# Patient Record
Sex: Female | Born: 1950 | Hispanic: Yes | Marital: Married | Smoking: Never smoker
Health system: Southern US, Community
[De-identification: ages and names within clinical notes are randomized; demographics above are authoritative.]

## PROBLEM LIST (undated history)

## (undated) DIAGNOSIS — M6282 Rhabdomyolysis: Secondary | ICD-10-CM

## (undated) DIAGNOSIS — H269 Unspecified cataract: Secondary | ICD-10-CM

## (undated) DIAGNOSIS — K219 Gastro-esophageal reflux disease without esophagitis: Secondary | ICD-10-CM

## (undated) DIAGNOSIS — R7401 Elevation of levels of liver transaminase levels: Secondary | ICD-10-CM

## (undated) DIAGNOSIS — D62 Acute posthemorrhagic anemia: Secondary | ICD-10-CM

## (undated) DIAGNOSIS — K8020 Calculus of gallbladder without cholecystitis without obstruction: Secondary | ICD-10-CM

## (undated) DIAGNOSIS — B369 Superficial mycosis, unspecified: Secondary | ICD-10-CM

## (undated) DIAGNOSIS — E785 Hyperlipidemia, unspecified: Secondary | ICD-10-CM

## (undated) DIAGNOSIS — R112 Nausea with vomiting, unspecified: Secondary | ICD-10-CM

## (undated) DIAGNOSIS — I8390 Asymptomatic varicose veins of unspecified lower extremity: Secondary | ICD-10-CM

## (undated) HISTORY — PX: GASTROSTOMY W/ FEEDING TUBE: SUR642

## (undated) HISTORY — DX: Unspecified cataract: H26.9

## (undated) HISTORY — DX: Acute posthemorrhagic anemia: D62

## (undated) HISTORY — DX: Elevation of levels of liver transaminase levels: R74.01

## (undated) HISTORY — DX: Rhabdomyolysis: M62.82

## (undated) HISTORY — PX: HERNIA REPAIR: SHX51

## (undated) HISTORY — DX: Asymptomatic varicose veins of unspecified lower extremity: I83.90

---

## 2008-10-11 ENCOUNTER — Ambulatory Visit (HOSPITAL_COMMUNITY): Admission: RE | Admit: 2008-10-11 | Discharge: 2008-10-11 | Payer: Self-pay | Admitting: Family Medicine

## 2008-10-21 ENCOUNTER — Ambulatory Visit (HOSPITAL_COMMUNITY): Admission: RE | Admit: 2008-10-21 | Discharge: 2008-10-21 | Payer: Self-pay | Admitting: Family Medicine

## 2009-01-15 HISTORY — PX: VARICOSE VEIN SURGERY: SHX832

## 2010-07-26 ENCOUNTER — Other Ambulatory Visit (HOSPITAL_COMMUNITY): Payer: Self-pay | Admitting: General Surgery

## 2010-07-26 DIAGNOSIS — R1011 Right upper quadrant pain: Secondary | ICD-10-CM

## 2010-07-26 DIAGNOSIS — R110 Nausea: Secondary | ICD-10-CM

## 2010-07-27 ENCOUNTER — Other Ambulatory Visit (HOSPITAL_COMMUNITY): Payer: Self-pay

## 2010-07-27 ENCOUNTER — Ambulatory Visit (HOSPITAL_COMMUNITY)
Admission: RE | Admit: 2010-07-27 | Discharge: 2010-07-27 | Disposition: A | Discharge status: Home / Self Care | Payer: Self-pay | Source: Ambulatory Visit | Attending: General Surgery | Admitting: General Surgery

## 2010-07-27 DIAGNOSIS — K8020 Calculus of gallbladder without cholecystitis without obstruction: Secondary | ICD-10-CM | POA: Insufficient documentation

## 2010-07-27 DIAGNOSIS — R1011 Right upper quadrant pain: Secondary | ICD-10-CM | POA: Insufficient documentation

## 2010-07-27 DIAGNOSIS — K7689 Other specified diseases of liver: Secondary | ICD-10-CM | POA: Insufficient documentation

## 2010-07-27 DIAGNOSIS — R110 Nausea: Secondary | ICD-10-CM

## 2010-08-01 ENCOUNTER — Encounter (HOSPITAL_COMMUNITY): Payer: Self-pay

## 2010-08-01 ENCOUNTER — Other Ambulatory Visit: Payer: Self-pay

## 2010-08-01 ENCOUNTER — Ambulatory Visit (HOSPITAL_COMMUNITY)
Admission: RE | Admit: 2010-08-01 | Discharge: 2010-08-01 | Disposition: A | Discharge status: Home / Self Care | Payer: Self-pay | Source: Ambulatory Visit | Attending: General Surgery | Admitting: General Surgery

## 2010-08-01 DIAGNOSIS — Z01810 Encounter for preprocedural cardiovascular examination: Secondary | ICD-10-CM | POA: Insufficient documentation

## 2010-08-01 DIAGNOSIS — Z01812 Encounter for preprocedural laboratory examination: Secondary | ICD-10-CM | POA: Insufficient documentation

## 2010-08-01 DIAGNOSIS — K8020 Calculus of gallbladder without cholecystitis without obstruction: Secondary | ICD-10-CM | POA: Insufficient documentation

## 2010-08-01 HISTORY — DX: Nausea with vomiting, unspecified: R11.2

## 2010-08-01 LAB — BASIC METABOLIC PANEL
CO2: 28 mEq/L (ref 19–32)
Chloride: 102 mEq/L (ref 96–112)
Creatinine, Ser: 0.5 mg/dL (ref 0.50–1.10)
GFR calc Af Amer: >60 mL/min (ref >60)
Glucose, Bld: 95 mg/dL (ref 70–99)
Potassium: 4.5 mEq/L (ref 3.5–5.1)

## 2010-08-01 LAB — CBC
MCHC: 33.9 g/dL (ref 30.0–36.0)
MCV: 95.8 fL (ref 78.0–100.0)
Platelets: 227 10*3/uL (ref 150–400)
RBC: 4.06 MIL/uL (ref 3.87–5.11)
RDW: 12.7 % (ref 11.5–15.5)

## 2010-08-01 LAB — HEPATIC FUNCTION PANEL
AST: 18 U/L (ref 0–37)
Albumin: 4.4 g/dL (ref 3.5–5.2)
Alkaline Phosphatase: 78 U/L (ref 39–117)
Indirect Bilirubin: 0.6 mg/dL (ref 0.3–0.9)
Total Bilirubin: 0.7 mg/dL (ref 0.3–1.2)

## 2010-08-01 LAB — DIFFERENTIAL
Lymphs Abs: 2.5 10*3/uL (ref 0.7–4.0)
Neutrophils Relative %: 43 % (ref 43–77)

## 2010-08-01 LAB — AMYLASE: Amylase: 34 U/L (ref 0–105)

## 2010-08-01 NOTE — Patient Instructions (Signed)
20 KOMAL STANGELO  08/01/2010   Your procedure is scheduled on:  Tuesday, 08/08/10  Report to [facility] at 06:15 AM.  Call this number if you have problems the morning of surgery: [phone]   Remember:   Do not eat food:After Midnight.  Do not drink clear liquids: After Midnight.  Take these medicines the morning of surgery with A SIP OF WATER: none   Do not wear jewelry, make-up or nail polish.  Do not bring valuables to the hospital.  Contacts, dentures or bridgework may not be worn into surgery.  Leave suitcase in the car. After surgery it may be brought to your room.  For patients admitted to the hospital, checkout time is 11:00 AM the day of discharge.   Patients discharged the day of surgery will not be allowed to drive home.  Name and phone number of your driver: France Ravens  Special Instructions: CHG Shower Shower 2 days before surgery and 1 day before surgery with Hibiclens.   Please read over the following fact sheets that you were given: Pain Booklet, MRSA Information, Surgical Site Infection Prevention and Anesthesia Post-op Instructions   Instrucciones a seguir luego de la anestesia general en los adultos (Instructions Following General Anesthetic, Adult) Usted fue sometido a anestesia general. Un anestesista (un enfermero especializado en administrar anestesia) o un anestesilogo (un mdico especializado en administrar anestesia) lo ha inducido a dormir con Designer, multimedia un procedimiento. Durante las 24 horas siguientes al procedimiento podr sentir:  Research scientist (life sciences).   Debilidad.   Somnolencia.  DESPUS DE LA CIRUGA Despus de la [place], lo llevarn a una sala de recuperacin. Un enfermero controlar su evolucin. Una vez que despierte, se encuentre estabilizado y pueda ingerir lquidos, usted podr volver a su hogar excepto que ocurra un imprevisto. La informacin que sigue es vlida para las primeras 24 horas posteriores a Higher education careers adviser.  No  conduzca. Si est solo, no tome transportes pblicos.   No beba alcohol.   No tome medicamentos que no le haya autorizado el profesional que lo asiste.   No firme documentos importantes ni tome decisiones trascendentes.   Es importante que una persona responsable lo acompae durante las primeras 24 horas luego de la anestesia.   Puede reanudar su dieta y sus actividades normales segn se le haya indicado.   Utilice los medicamentos de venta libre o de prescripcin para Chief Technology Officer, Environmental health practitioner o la [place], segn se lo indique el profesional que lo asiste.  Si tiene preguntas o se le presenta algn problema relacionado con la anestesia, comunquese con el hospital y pida por el anestesista o anestesilogo de [place]. SOLICITE ATENCIN MDICA DE INMEDIATO SI:  Aparece una erupcin cutnea.   Presenta dificultad para respirar.   Siente dolor en el pecho.   Presenta algn problema de alergia.  Document Released: 01/01/2005 Document Re-Released: 06/21/2009 [facility] Patient Information 2011 [place], [place].

## 2010-08-08 ENCOUNTER — Encounter (HOSPITAL_COMMUNITY): Payer: Self-pay | Admitting: Anesthesiology

## 2010-08-08 ENCOUNTER — Ambulatory Visit (HOSPITAL_COMMUNITY): Payer: Self-pay | Admitting: Anesthesiology

## 2010-08-08 ENCOUNTER — Encounter (HOSPITAL_COMMUNITY): Payer: Self-pay | Admitting: General Surgery

## 2010-08-08 ENCOUNTER — Other Ambulatory Visit: Payer: Self-pay | Admitting: General Surgery

## 2010-08-08 ENCOUNTER — Encounter (HOSPITAL_COMMUNITY)
Admission: RE | Disposition: A | Discharge status: Home / Self Care | Payer: Self-pay | Source: Ambulatory Visit | Attending: General Surgery

## 2010-08-08 ENCOUNTER — Inpatient Hospital Stay (HOSPITAL_COMMUNITY)
Admission: RE | Admit: 2010-08-08 | Discharge: 2010-08-09 | DRG: 419 | Disposition: A | Discharge status: Home / Self Care | Payer: Self-pay | Source: Ambulatory Visit | Attending: General Surgery | Admitting: General Surgery

## 2010-08-08 DIAGNOSIS — Z01812 Encounter for preprocedural laboratory examination: Secondary | ICD-10-CM

## 2010-08-08 DIAGNOSIS — K8010 Calculus of gallbladder with chronic cholecystitis without obstruction: Principal | ICD-10-CM | POA: Diagnosis present

## 2010-08-08 DIAGNOSIS — K8020 Calculus of gallbladder without cholecystitis without obstruction: Secondary | ICD-10-CM

## 2010-08-08 DIAGNOSIS — Z01810 Encounter for preprocedural cardiovascular examination: Secondary | ICD-10-CM

## 2010-08-08 HISTORY — PX: CHOLECYSTECTOMY: SHX55

## 2010-08-08 HISTORY — DX: Calculus of gallbladder without cholecystitis without obstruction: K80.20

## 2010-08-08 SURGERY — LAPAROSCOPIC CHOLECYSTECTOMY
Anesthesia: General | Wound class: Contaminated

## 2010-08-08 MED ORDER — ROCURONIUM BROMIDE 100 MG/10ML IV SOLN
INTRAVENOUS | Status: DC | PRN
  Administered 2010-08-08: 5 mg via INTRAVENOUS
  Administered 2010-08-08: 25 mg via INTRAVENOUS
  Administered 2010-08-08: 10 mg via INTRAVENOUS

## 2010-08-08 MED ORDER — LORAZEPAM 1 MG PO TABS: 1.0000 mg | ORAL_TABLET | Freq: Every evening | ORAL | Status: DC | PRN

## 2010-08-08 MED ORDER — LACTATED RINGERS IV SOLN
INTRAVENOUS | Status: DC
  Administered 2010-08-08 (×2): via INTRAVENOUS

## 2010-08-08 MED ORDER — POTASSIUM CHLORIDE IN NACL 20-0.9 MEQ/L-% IV SOLN
INTRAVENOUS | Status: DC
  Administered 2010-08-08 – 2010-08-09 (×2): via INTRAVENOUS
  Administered 2010-08-09: 1000 mL via INTRAVENOUS
  Filled 2010-08-08: qty 1000

## 2010-08-08 MED ORDER — FENTANYL CITRATE 0.05 MG/ML IJ SOLN
INTRAMUSCULAR | Status: AC
  Filled 2010-08-08: qty 5

## 2010-08-08 MED ORDER — ONDANSETRON HCL 4 MG/2ML IJ SOLN
4.0000 mg | Freq: Once | INTRAMUSCULAR | Status: AC
  Administered 2010-08-08: 4 mg via INTRAVENOUS

## 2010-08-08 MED ORDER — MORPHINE SULFATE 2 MG/ML IJ SOLN
1.0000 mg | INTRAMUSCULAR | Status: DC | PRN
  Administered 2010-08-08 (×3): 1 mg via INTRAVENOUS
  Filled 2010-08-08 (×3): qty 1

## 2010-08-08 MED ORDER — ONDANSETRON HCL 4 MG/2ML IJ SOLN: 4.0000 mg | Freq: Four times a day (QID) | INTRAMUSCULAR | Status: DC | PRN

## 2010-08-08 MED ORDER — ROCURONIUM BROMIDE 50 MG/5ML IV SOLN
INTRAVENOUS | Status: AC
  Filled 2010-08-08: qty 1

## 2010-08-08 MED ORDER — MIDAZOLAM HCL 2 MG/2ML IJ SOLN
1.0000 mg | INTRAMUSCULAR | Status: DC | PRN
  Administered 2010-08-08 (×2): 2 mg via INTRAVENOUS

## 2010-08-08 MED ORDER — ONDANSETRON HCL 4 MG/2ML IJ SOLN
INTRAMUSCULAR | Status: AC
  Administered 2010-08-08: 4 mg via INTRAVENOUS
  Filled 2010-08-08: qty 2

## 2010-08-08 MED ORDER — ONDANSETRON HCL 4 MG/2ML IJ SOLN: 4.0000 mg | Freq: Once | INTRAMUSCULAR | Status: DC | PRN

## 2010-08-08 MED ORDER — GLYCOPYRROLATE 0.2 MG/ML IJ SOLN
INTRAMUSCULAR | Status: DC | PRN
  Administered 2010-08-08 (×2): 0.2 mg via INTRAVENOUS

## 2010-08-08 MED ORDER — SODIUM CHLORIDE 0.9 % IV SOLN: INTRAVENOUS | Status: DC

## 2010-08-08 MED ORDER — MIDAZOLAM HCL 2 MG/2ML IJ SOLN
INTRAMUSCULAR | Status: AC
  Administered 2010-08-08: 2 mg via INTRAVENOUS
  Filled 2010-08-08: qty 2

## 2010-08-08 MED ORDER — LIDOCAINE HCL (PF) 1 % IJ SOLN
INTRAMUSCULAR | Status: AC
  Filled 2010-08-08: qty 5

## 2010-08-08 MED ORDER — NEOSTIGMINE METHYLSULFATE 1 MG/ML IJ SOLN
INTRAMUSCULAR | Status: DC | PRN
  Administered 2010-08-08 (×2): 1.24 mg via INTRAMUSCULAR

## 2010-08-08 MED ORDER — BUPIVACAINE HCL (PF) 0.5 % IJ SOLN
INTRAMUSCULAR | Status: AC
  Filled 2010-08-08: qty 30

## 2010-08-08 MED ORDER — ACETAMINOPHEN 325 MG PO TABS
650.0000 mg | ORAL_TABLET | ORAL | Status: DC | PRN
  Administered 2010-08-08 – 2010-08-09 (×2): 650 mg via ORAL
  Filled 2010-08-08 (×2): qty 2

## 2010-08-08 MED ORDER — PHENOL 1.4 % MT LIQD
1.0000 | OROMUCOSAL | Status: DC | PRN
  Administered 2010-08-08: 1 via OROMUCOSAL
  Filled 2010-08-08: qty 177

## 2010-08-08 MED ORDER — DEXTROSE 5 % IV SOLN
INTRAVENOUS | Status: AC
  Filled 2010-08-08: qty 1

## 2010-08-08 MED ORDER — GLYCOPYRROLATE 0.2 MG/ML IJ SOLN
INTRAMUSCULAR | Status: AC
  Filled 2010-08-08: qty 1

## 2010-08-08 MED ORDER — PROPOFOL 10 MG/ML IV EMUL
INTRAVENOUS | Status: AC
  Filled 2010-08-08: qty 20

## 2010-08-08 MED ORDER — LIDOCAINE HCL (PF) 1 % IJ SOLN
INTRAMUSCULAR | Status: AC
  Filled 2010-08-08: qty 2

## 2010-08-08 MED ORDER — BUPIVACAINE HCL (PF) 0.25 % IJ SOLN
INTRAMUSCULAR | Status: DC | PRN
  Administered 2010-08-08: 11 mL

## 2010-08-08 MED ORDER — FENTANYL CITRATE 0.05 MG/ML IJ SOLN
INTRAMUSCULAR | Status: DC | PRN
  Administered 2010-08-08 (×2): 50 ug via INTRAVENOUS
  Administered 2010-08-08: 100 ug via INTRAVENOUS
  Administered 2010-08-08 (×6): 50 ug via INTRAVENOUS

## 2010-08-08 MED ORDER — FENTANYL CITRATE 0.05 MG/ML IJ SOLN: 25.0000 ug | INTRAMUSCULAR | Status: DC | PRN

## 2010-08-08 MED ORDER — PROPOFOL 10 MG/ML IV EMUL
INTRAVENOUS | Status: DC | PRN
  Administered 2010-08-08: 100 mg via INTRAVENOUS

## 2010-08-08 MED ORDER — DEXTROSE 5 % IV SOLN: 1.0000 g | Freq: Once | INTRAVENOUS | Status: DC

## 2010-08-08 MED ORDER — NEOSTIGMINE METHYLSULFATE 1 MG/ML IJ SOLN
INTRAMUSCULAR | Status: AC
  Filled 2010-08-08: qty 10

## 2010-08-08 SURGICAL SUPPLY — 67 items
APPLICATOR COTTON TIP 6IN STRL (MISCELLANEOUS) ×2 IMPLANT
APPLIER CLIP ROT 10 11.4 M/L (STAPLE) ×2
ATTRACTOMAT 16X20 MAGNETIC DRP (DRAPES) IMPLANT
BAG HAMPER (MISCELLANEOUS) ×2 IMPLANT
BLADE SURG 15 STRL LF DISP TIS (BLADE) ×1 IMPLANT
BLADE SURG 15 STRL SS (BLADE) ×1
BLADE SURG SZ10 CARB STEEL (BLADE) IMPLANT
CLIP APPLIE ROT 10 11.4 M/L (STAPLE) ×1 IMPLANT
CLOTH BEACON ORANGE TIMEOUT ST (SAFETY) ×2 IMPLANT
COVER LIGHT HANDLE STERIS (MISCELLANEOUS) ×4 IMPLANT
DECANTER SPIKE VIAL GLASS SM (MISCELLANEOUS) ×2 IMPLANT
DISSECTOR BLUNT TIP ENDO 5MM (MISCELLANEOUS) ×4 IMPLANT
DRAPE WARM FLUID 44X44 (DRAPE) IMPLANT
DRSG TEGADERM 2-3/8X2-3/4 SM (GAUZE/BANDAGES/DRESSINGS) ×6 IMPLANT
ELECT BLADE 6 FLAT ULTRCLN (ELECTRODE) IMPLANT
ELECT REM PT RETURN 9FT ADLT (ELECTROSURGICAL) ×2
ELECTRODE REM PT RTRN 9FT ADLT (ELECTROSURGICAL) ×1 IMPLANT
EVACUATOR DRAINAGE 10X20 100CC (DRAIN) ×1 IMPLANT
EVACUATOR SILICONE 100CC (DRAIN) ×1
FILTER SMOKE EVAC LAPAROSHD (FILTER) ×2 IMPLANT
FORMALIN 10 PREFIL 120ML (MISCELLANEOUS) ×2 IMPLANT
GLOVE BIO SURGEON STRL SZ8 (GLOVE) ×2 IMPLANT
GLOVE BIOGEL PI IND STRL 7.0 (GLOVE) ×2 IMPLANT
GLOVE BIOGEL PI INDICATOR 7.0 (GLOVE) ×2
GLOVE ECLIPSE 6.5 STRL STRAW (GLOVE) ×4 IMPLANT
GLOVE EXAM NITRILE XS STR PU (GLOVE) ×2 IMPLANT
GLOVE INDICATOR 8.0 STRL GRN (GLOVE) ×2 IMPLANT
GLOVE SKINSENSE NS SZ7.0 (GLOVE) ×1
GLOVE SKINSENSE STRL SZ7.0 (GLOVE) ×1 IMPLANT
GOWN BRE IMP SLV AUR XL STRL (GOWN DISPOSABLE) ×8 IMPLANT
HEMOSTAT SURGICEL 4X8 (HEMOSTASIS) ×2 IMPLANT
INST SET LAPROSCOPIC AP (KITS) ×2 IMPLANT
IV NS IRRIG 3000ML ARTHROMATIC (IV SOLUTION) ×2 IMPLANT
KIT ROOM TURNOVER APOR (KITS) ×2 IMPLANT
KIT TROCAR LAP CHOLE (TROCAR) ×2 IMPLANT
MANIFOLD NEPTUNE II (INSTRUMENTS) ×2 IMPLANT
NS IRRIG 1000ML POUR BTL (IV SOLUTION) ×2 IMPLANT
PACK LAP CHOLE LZT030E (CUSTOM PROCEDURE TRAY) ×2 IMPLANT
PAD ARMBOARD 7.5X6 YLW CONV (MISCELLANEOUS) ×2 IMPLANT
PENCIL HANDSWITCHING (ELECTRODE) IMPLANT
POUCH SPECIMEN RETRIEVAL 10MM (ENDOMECHANICALS) ×2 IMPLANT
RETRACTOR LAPSCP 12X46 CVD (ENDOMECHANICALS) ×1 IMPLANT
RTRCTR LAPSCP 12X46 CVD (ENDOMECHANICALS) ×2
SET BASIN LINEN APH (SET/KITS/TRAYS/PACK) ×2 IMPLANT
SET TUBE IRRIG SUCTION NO TIP (IRRIGATION / IRRIGATOR) ×2 IMPLANT
SOL PREP PROV IODINE SCRUB 4OZ (MISCELLANEOUS) ×2 IMPLANT
SPONGE DRAIN TRACH 4X4 STRL 2S (GAUZE/BANDAGES/DRESSINGS) ×2 IMPLANT
SPONGE GAUZE 4X4 12PLY (GAUZE/BANDAGES/DRESSINGS) ×2 IMPLANT
SPONGE INTESTINAL PEANUT (DISPOSABLE) ×4 IMPLANT
SPONGE LAP 18X18 X RAY DECT (DISPOSABLE) IMPLANT
STAPLER VISISTAT 35W (STAPLE) ×2 IMPLANT
SUT ETHILON 3 0 FSL (SUTURE) ×2 IMPLANT
SUT SILK 2 0 (SUTURE)
SUT SILK 2 0 SH (SUTURE) IMPLANT
SUT SILK 2-0 18XBRD TIE 12 (SUTURE) IMPLANT
SUT SILK 3 0 SH CR/8 (SUTURE) IMPLANT
SUT VIC AB 0 CT1 27 (SUTURE)
SUT VIC AB 0 CT1 27XBRD ANTBC (SUTURE) IMPLANT
SUT VIC AB 0 CT1 27XCR 8 STRN (SUTURE) IMPLANT
SUT VICRYL 0 UR6 27IN ABS (SUTURE) ×2 IMPLANT
SYR BULB IRRIGATION 50ML (SYRINGE) IMPLANT
TOWEL OR 17X26 4PK STRL BLUE (TOWEL DISPOSABLE) ×2 IMPLANT
TRAY FOLEY CATH 14FR (SET/KITS/TRAYS/PACK) ×2 IMPLANT
TROCAR Z-THREAD SLEEVE 11X100 (TROCAR) ×2 IMPLANT
WARMER LAPAROSCOPE (MISCELLANEOUS) ×2 IMPLANT
WATER STERILE IRR 1000ML POUR (IV SOLUTION) ×4 IMPLANT
YANKAUER SUCT BULB TIP 10FT TU (MISCELLANEOUS) IMPLANT

## 2010-08-08 NOTE — Anesthesia Preprocedure Evaluation (Addendum)
Anesthesia Evaluation  Name, MR# and DOB Patient awake  General Assessment Comment  Reviewed: Allergy & Precautions, H&P  and Patient's Chart, lab work & pertinent test results  Airway Mallampati: II  Neck ROM: Full    Dental  (+) Edentulous Upper and Edentulous Lower   Pulmonaryneg pulmonary ROS    clear to auscultation    Cardiovascular Regular Normal   Neuro/Psych  GI/Hepatic/Renal   Endo/Other   Abdominal   Musculoskeletal  Hematology   Peds  Reproductive/Obstetrics   Anesthesia Other Findings             Anesthesia Physical Anesthesia Plan  ASA: I  Anesthesia Plan: General   Post-op Pain Management:    Induction:   Airway Management Planned: Oral ETT  Additional Equipment:   Intra-op Plan:   Post-operative Plan:   Informed Consent: I have reviewed the patients History and Physical, chart, labs and discussed the procedure including the risks, benefits and alternatives for the proposed anesthesia with the patient or authorized representative who has indicated his/her understanding and acceptance.     Plan Discussed with:   Anesthesia Plan Comments:         Anesthesia Quick Evaluation

## 2010-08-08 NOTE — Op Note (Signed)
Lisa Mcdowell, Lisa Mcdowell NO.:  [id number]  MEDICAL RECORD NO.:  [id number]  LOCATION:  A306                          FACILITY:  [facility]  PHYSICIAN:  Barbaraann Barthel, M.D. DATE OF BIRTH:  [birth year]  DATE OF PROCEDURE:  08/08/2010 DATE OF DISCHARGE:                              OPERATIVE REPORT   SURGEON:  Barbaraann Barthel, MD.  PREOPERATIVE DIAGNOSIS:  Cholecystitis, cholelithiasis.  POSTOPERATIVE DIAGNOSIS:  Cholecystitis, cholelithiasis.  PROCEDURE:  Laparoscopic cholecystectomy.  SPECIMEN:  Gallbladder and stones.  WOUND CLASSIFICATION:  Contaminated (small amount of bile leakage).  Note, this is a 60 year old [place] American female, who had recurrent episodes of right upper quadrant pain and nausea.  We discussed the need for surgery for her when a sonogram revealed that she had multiple gallstones.  We discussed complications not limited to but including bleeding, infection, damage to bile ducts, perforation of organs, transitory diarrhea, and the possibility that open surgery might be required.  This was all explained to her in [place].  An informed consent was obtained.  TECHNIQUE:  The patient was placed in supine position.  After the adequate administration of general anesthesia via endotracheal intubation, her entire abdomen was prepped with Betadine solution and draped in usual manner.  Foley catheter was aseptically inserted, and after prepping with Betadine and draping in the usual manner, a periumbilical incision was carried out over the superior aspect of the umbilicus.  This incision was carried down to the fascia.  The fascia was clearly visualized, grasped with a sharp towel clip and elevated, and a Veress needle was inserted and confirmed the position with a saline drop test.  We then insufflated the abdomen with approximately 3.5 liters of CO2, and then placed the cameras using the Visiport technique through an 11-mm cannula in the  umbilicus.  We then under direct vision placed 11-mm cannula in the epigastrium and two 5-mm cannulas in the right upper quadrant laterally.  Gallbladder was grasped.  Its adhesions were taken down.  She had a very floppy left lobe of the liver which we were able to bipennate the patient manipulation, able to get out of the view of the surgery, and then with careful dissection, we dissected the cystic duct, which was a short cystic duct, we put two silver clips on this, and silver clipped close to the gallbladder and then divided this as well as put two clips on the cystic artery.  The gallbladder was then removed uneventfully from the liver bed using the hook cautery device.  We checked for hemostasis. There was very minimal oozing.  We irrigated with normal saline solution i.e. had a small amount of oozing from the gallbladder which I nicked and removing it.  I elected to place Surgicel on a Jackson-Pratt drain in the liver bed after irrigation.  The Jackson-Pratt drain exited through one of the lateral most sites.  We then desufflated the abdomen after checking for hemostasis and then sutured the drain in place with 3- 0 nylon and closed the fascia in the area of the umbilicus and the epigastrium with 0 Polysorb suture.  I then used 0.5% Sensorcaine for all port sites for postoperative comfort, then closed the  wound with stapling device.  Prior to closure, all sponge, needle, and instrument counts found to be correct.  Estimated blood loss was minimal.  The patient received approximately 1400 mL of crystalloids intraoperatively. There were no complications.     Barbaraann Barthel, M.D.     WB/MEDQ  D:  08/08/2010  T:  08/08/2010  Job:  [id number]

## 2010-08-08 NOTE — Transfer of Care (Signed)
Immediate Anesthesia Transfer of Care Note  Patient: Lisa Mcdowell  Procedure(s) Performed:  LAPAROSCOPIC CHOLECYSTECTOMY  Patient Location: PACU  Anesthesia Type: General  Level of Consciousness: awake, alert  and oriented  Airway & Oxygen Therapy: Patient Spontanous Breathing and Patient connected to face mask oxygen  Post-op Assessment: Report given to PACU RN, Post -op Vital signs reviewed and stable and Patient moving all extremities  Post vital signs: Reviewed and stable  Complications: No apparent anesthesia complications

## 2010-08-08 NOTE — Brief Op Note (Signed)
08/08/2010  9:56 AM  PATIENT:  Lisa Mcdowell  60 y.o. female  PRE-OPERATIVE DIAGNOSIS:  Cholelithiasis/cholecystitis  POST-OPERATIVE DIAGNOSIS:Cholelithiasis/cholecystitis   PROCEDURE:  Procedure(s):LAPAROSCOPIC CHOLECYSTECTOMY    SURGEON:  Surgeon(s): Marlane Hatcher  PHYSICIAN ASSISTANT:   ASSISTANTS:    ANESTHESIA:   general  ESTIMATED BLOOD LOSS:minimal   BLOOD ADMINISTERED:none  DRAINS: (in liver bed) Jackson-Pratt drain(s) with closed bulb suction in the    LOCAL MEDICATIONS USED:  XYLOCAINE 10CC  SPECIMEN:  Source of Specimen:  gall bladder and stones  DISPOSITION OF SPECIMEN:  PATHOLOGY  COUNTS:  YES  TOURNIQUET:  * No tourniquets in log *  DICTATION #:    PLAN OF CARE: observation  PATIENT DISPOSITION:  PACU - hemodynamically stable.   Delay start of Pharmacological VTE agent (>24hrs) due to surgical blood loss or risk of bleeding:  yes

## 2010-08-08 NOTE — Anesthesia Postprocedure Evaluation (Signed)
  Anesthesia Post-op Note  Patient: Lisa Mcdowell  Procedure(s) Performed:  LAPAROSCOPIC CHOLECYSTECTOMY  Patient Location: PACU  Anesthesia Type: General  Level of Consciousness: awake  Airway and Oxygen Therapy: Patient Spontanous Breathing  Post-op Pain: mild  Post-op Assessment: Patient's Cardiovascular Status Stable, Respiratory Function Stable, Patent Airway and No signs of Nausea or vomiting  Post-op Vital Signs: stable  Complications: No apparent anesthesia complications

## 2010-08-08 NOTE — Progress Notes (Signed)
  Pt awake and alert. vital signs stable has voided. Abdomen soft minimal sero sanguinous  jp drainage. Wounds clean and dry. Doing well post op.

## 2010-08-08 NOTE — Anesthesia Procedure Notes (Signed)
Procedure Name: Intubation Date/Time: 08/08/2010 8:18 AM Performed by: Marylene Buerger Pre-anesthesia Checklist: Suction available, Emergency Drugs available, Patient identified and Patient being monitored Patient Re-evaluated:Patient Re-evaluated prior to inductionOxygen Delivery Method: Circle System Utilized Preoxygenation: Pre-oxygenation with 100% oxygen Intubation Type: IV induction Ventilation: Mask ventilation without difficulty Laryngoscope Size: Mac and 3 Grade View: Grade I Tube type: Oral Tube size: 7.0 mm Number of attempts: 1 Placement Confirmation: breath sounds checked- equal and bilateral,  positive ETCO2 and ETT inserted through vocal cords under direct vision Secured at: 21 cm Tube secured with: Tape Dental Injury: Teeth and Oropharynx as per pre-operative assessment

## 2010-08-08 NOTE — H&P (Signed)
NAMELATORIE, MONTESANO NO.:  [id number]  MEDICAL RECORD NO.:  [id number]  LOCATION:                                 FACILITY:  PHYSICIAN:  Barbaraann Barthel, M.D. DATE OF BIRTH:  06/23/1952  DATE OF ADMISSION: DATE OF DISCHARGE:  LH                             HISTORY & PHYSICAL   HISTORY OF PRESENT ILLNESS:  This is a 60 year old Latin American female with approximately 2 years history of recurrent abdominal pain with nausea that is basically postprandial in nature and located primarily to the right upper quadrant and at times radiating to her back.  She has had no vomiting, however, she has had recurrent nausea from this.  She was worked up as an outpatient and she was found to have on sonogram a gallbladder with multiple stones.  Rest of her laboratory work is pending at this time.  PAST MEDICAL HISTORY:  Unremarkable.  PAST SURGICAL HISTORY:  She underwent a left leg varicose laser surgery last year in 2011.  ALLERGIES:  She has no known allergies.  MEDICATIONS:  Takes no medications on a regular basis.  SOCIAL HISTORY:  She is a nonsmoker and a nondrinker.  REVIEW OF SYSTEMS:  NEUROLOGIC:  No history of migraines or seizures. ENDOCRINE SYSTEM:  No diabetes or thyroid disease.  CARDIORESPIRATORY SYSTEM:  Negative.  MUSCULOSKELETAL SYSTEM:  Within normal limits.  The patient has had no colonoscopy.  RECTAL:  Deferred at this time. OB/GYN:  She is a gravida 3, para 3, cesarean zero, abortus zero female with no family history of breast carcinoma.  Her last mammogram was in 2011.  GI SYSTEM:  No history of hepatitis, constipation, diarrhea, or bright red rectal bleeding, black tarry stools or history of inflammatory bowel disease or unexplained weight loss.  GU SYSTEM:  No dysuria or history of kidney stones.  PHYSICAL EXAMINATION:  VITAL SIGNS:  She is 5 feet 7 inches, weighs 170 pounds, her temperature is 98.3, her pulse rate is 68, her  respirations are 12, and her blood pressure is 130/70. HEAD:  Normocephalic. EYES:  Extraocular movements are intact.  Pupils are round and react to light and accommodation.  There is no conjunctival pallor or scleral injection.  There is no icterus.  The patient does have pterygiums noted bilaterally. NOSE AND ORAL MUCOSA:  Within normal limits.  The patient has dental prosthesis. NECK:  Supple and cylindrical without jugular vein distention, thyromegaly, tracheal deviation or cervical adenopathy.  No bruits are appreciated. CHEST:  Clear, both anterior and posterior auscultation. HEART:  Regular rhythm. BREASTS AND AXILLA:  Without masses. ABDOMEN:  The patient has some mild right upper quadrant pain but without rebound. PELVIC:  Deferred. EXTREMITIES:  As stated, she is status post left varicose vein surgery and wears elastic hose over this. SKIN:  Integuments otherwise are normal.    We discussed the need for surgery with this patient in Spanish in great detail, explaining complications not limited to but including bleeding, infection, damage to bile ducts, perforation of organs, transitory diarrhea, and the possibility that open surgery might be required. Informed consent was obtained.  We will follow up with her for out patient surgery.  Barbaraann Barthel, M.D.     WB/MEDQ  D:  08/07/2010  T:  08/08/2010  Job:  [id number]  cc:   Outpatient Surgery Department  EPIC

## 2010-08-09 LAB — CBC
MCH: 33.1 pg (ref 26.0–34.0)
MCHC: 34.4 g/dL (ref 30.0–36.0)
Platelets: 179 10*3/uL (ref 150–400)
RDW: 12.9 % (ref 11.5–15.5)

## 2010-08-09 LAB — HEPATIC FUNCTION PANEL
Albumin: 3.1 g/dL — ABNORMAL LOW (ref 3.5–5.2)
Indirect Bilirubin: 0.6 mg/dL (ref 0.3–0.9)
Total Protein: 6.8 g/dL (ref 6.0–8.3)

## 2010-08-09 LAB — BASIC METABOLIC PANEL
BUN: 9 mg/dL (ref 6–23)
Calcium: 8.4 mg/dL (ref 8.4–10.5)
Creatinine, Ser: 0.47 mg/dL — ABNORMAL LOW (ref 0.50–1.10)
GFR calc Af Amer: >60 mL/min (ref >60)
GFR calc non Af Amer: >60 mL/min (ref >60)
Potassium: 3.6 mEq/L (ref 3.5–5.1)

## 2010-08-09 LAB — GLUCOSE, CAPILLARY: Glucose-Capillary: 107 mg/dL — ABNORMAL HIGH (ref 70–99)

## 2010-08-09 NOTE — Discharge Summary (Signed)
  Brief observation discharge note: Patient underwent lap cholecystectomy for cholecystitis and cholelithiasis via outpatient department. Uneventful post op course. Discharge on first postoperative day. Wound clean; labs acceptable; patient with minimal discomfort. Discharge and follow up arranged in spanish.

## 2010-08-09 NOTE — Progress Notes (Signed)
Pt discharged home today with family per Dr. Malvin Johns. Pt's VS stable at this time. Pt's IV site D/C'd and WNL. Pt and daughter provided with discharge instructions including home medication list and follow up appointment instructions in spanish. Pt and daughter verbalized understanding. Pt left floor via WC accompanied by Evette Cristal, NT. Dagoberto Ligas, RN

## 2010-08-09 NOTE — Progress Notes (Signed)
  POD#1  Afebrile, vital signs stable, voiding well.  Wound clean, minimal JP drainage.  Labs OK.  LFS OK; bili normal.dressing changed.  D/C and F/U arranged. Vital signs: 98.8 F (37.1 C) Pulse: 73 --RR: 18 B/P: 120/70 mmHg  94 % None (Room air)        Labs ; Hepatic function panel  Resulted: 08/09/10 0710 Specimen Type: Blood Total Protein 6.8 g/dL Albumin 3.1 g/dL L AST 39 U/L H ALT 30 U/L Alkaline Phosphatase 57 U/L Total Bilirubin 0.7 mg/dL Bilirubin, Direct 0.1 mg/dL Indirect Bilirubin 0.6 mg/dL   CBC: WBC 8.9 K/uL RBC 3.50 MIL/uL L Hemoglobin 11.6 g/dL L HCT 78.2 % L MCV 95.6 fL MCH 33.1 pg MCHC 34.4 g/dL RDW 21.3 % Platelets 086 K  Sodium 138 mEq/L Potassium 3.6 mEq/L Chloride 106 mEq/L CO2 21 mEq/L Glucose, Bld 107 mg/dL H BUN 9 mg/dL Creatinine, Ser 5.78 mg/dL L Calcium 8.4 mg/dL GFR calc non Af Amer >46 mL/min GFR calc Af Amer >60 mL/min

## 2010-08-16 ENCOUNTER — Encounter (HOSPITAL_COMMUNITY): Payer: Self-pay | Admitting: General Surgery

## 2013-01-26 ENCOUNTER — Emergency Department (HOSPITAL_COMMUNITY): Payer: Self-pay

## 2013-01-26 ENCOUNTER — Emergency Department (HOSPITAL_COMMUNITY)
Admission: EM | Admit: 2013-01-26 | Discharge: 2013-01-26 | Disposition: A | Discharge status: Home / Self Care | Payer: Self-pay | Attending: Emergency Medicine | Admitting: Emergency Medicine

## 2013-01-26 ENCOUNTER — Encounter (HOSPITAL_COMMUNITY): Payer: Self-pay | Admitting: Emergency Medicine

## 2013-01-26 DIAGNOSIS — Z8719 Personal history of other diseases of the digestive system: Secondary | ICD-10-CM | POA: Insufficient documentation

## 2013-01-26 DIAGNOSIS — R079 Chest pain, unspecified: Secondary | ICD-10-CM

## 2013-01-26 DIAGNOSIS — R0789 Other chest pain: Secondary | ICD-10-CM | POA: Insufficient documentation

## 2013-01-26 LAB — CBC WITH DIFFERENTIAL/PLATELET
BASOS PCT: 1 % (ref 0–1)
Basophils Absolute: 0 10*3/uL (ref 0.0–0.1)
Eosinophils Absolute: 0.1 10*3/uL (ref 0.0–0.7)
Eosinophils Relative: 2 % (ref 0–5)
HEMATOCRIT: 37.5 % (ref 36.0–46.0)
HEMOGLOBIN: 13.3 g/dL (ref 12.0–15.0)
Lymphocytes Relative: 41 % (ref 12–46)
Lymphs Abs: 2.3 10*3/uL (ref 0.7–4.0)
MCH: 34.2 pg — AB (ref 26.0–34.0)
MCHC: 35.5 g/dL (ref 30.0–36.0)
MCV: 96.4 fL (ref 78.0–100.0)
MONO ABS: 0.4 10*3/uL (ref 0.1–1.0)
MONOS PCT: 7 % (ref 3–12)
NEUTROS ABS: 2.8 10*3/uL (ref 1.7–7.7)
Neutrophils Relative %: 50 % (ref 43–77)
Platelets: 248 10*3/uL (ref 150–400)
RBC: 3.89 MIL/uL (ref 3.87–5.11)
RDW: 12.8 % (ref 11.5–15.5)
WBC: 5.7 10*3/uL (ref 4.0–10.5)

## 2013-01-26 LAB — COMPREHENSIVE METABOLIC PANEL
ALBUMIN: 3.8 g/dL (ref 3.5–5.2)
ALT: 18 U/L (ref 0–35)
AST: 22 U/L (ref 0–37)
Alkaline Phosphatase: 79 U/L (ref 39–117)
BILIRUBIN TOTAL: 0.3 mg/dL (ref 0.3–1.2)
BUN: 17 mg/dL (ref 6–23)
CHLORIDE: 104 meq/L (ref 96–112)
CO2: 24 mEq/L (ref 19–32)
CREATININE: 0.65 mg/dL (ref 0.50–1.10)
Calcium: 9.1 mg/dL (ref 8.4–10.5)
GFR calc Af Amer: >90 mL/min (ref >90)
Glucose, Bld: 99 mg/dL (ref 70–99)
Potassium: 4 mEq/L (ref 3.7–5.3)
Sodium: 140 mEq/L (ref 137–147)
Total Protein: 7.9 g/dL (ref 6.0–8.3)

## 2013-01-26 LAB — TROPONIN I

## 2013-01-26 MED ORDER — TRAMADOL HCL 50 MG PO TABS
ORAL_TABLET | ORAL | Status: DC
Start: 2013-01-26 — End: 2014-10-28

## 2013-01-26 NOTE — ED Notes (Signed)
Family at bedside. Patient speaks no [place], family at beside to translate for patient.

## 2013-01-26 NOTE — ED Notes (Signed)
Pt states right sided back pain and right sided CP x 3 months. Pain is intermittent. Also states intermittent swelling to right wrist area.

## 2013-01-26 NOTE — Discharge Instructions (Signed)
Follow up with your md in 1-2 weeks. °

## 2013-01-26 NOTE — ED Provider Notes (Signed)
CSN: [id number]     Arrival date & time 01/26/13  1339 History   First MD Initiated Contact with Patient 01/26/13 1604     Chief Complaint  Patient presents with  . Back Pain   (Consider location/radiation/quality/duration/timing/severity/associated sxs/prior Treatment) Patient is a 63 y.o. female presenting with back pain. The history is provided by the patient (the pt complains of back and right side chest pain).  Back Pain Location:  Thoracic spine Quality:  Aching Radiates to: right chest. Pain severity:  Moderate Onset quality:  Gradual Timing:  Intermittent Progression:  Waxing and waning Chronicity:  New Context: not emotional stress   Associated symptoms: chest pain   Associated symptoms: no abdominal pain and no headaches     Past Medical History  Diagnosis Date  . Nausea & vomiting   . Cholelithiasis   . Gallstones 08/08/2010   Past Surgical History  Procedure Laterality Date  . Varicose vein surgery  2011    [facility]  . Cholecystectomy  08/08/2010    Procedure: LAPAROSCOPIC CHOLECYSTECTOMY;  Surgeon: Scherry Ran;  Location: [facility];  Service: General;  Laterality: N/A;   No family history on file. History  Substance Use Topics  . Smoking status: Never Smoker   . Smokeless tobacco: Never Used  . Alcohol Use: No   OB History   Grav Para Term Preterm Abortions TAB SAB Ect Mult Living                 Review of Systems  Constitutional: Negative for appetite change and fatigue.  HENT: Negative for congestion, ear discharge and sinus pressure.   Eyes: Negative for discharge.  Respiratory: Negative for cough.   Cardiovascular: Positive for chest pain.  Gastrointestinal: Negative for abdominal pain and diarrhea.  Genitourinary: Negative for frequency and hematuria.  Musculoskeletal: Positive for back pain.  Skin: Negative for rash.  Neurological: Negative for seizures and headaches.  Psychiatric/Behavioral: Negative for hallucinations.    Allergies   Review of patient's allergies indicates no known allergies.  Home Medications   Current Outpatient Rx  Name  Route  Sig  Dispense  Refill  . acetaminophen (TYLENOL) 500 MG tablet   Oral   Take 500 mg by mouth every 6 (six) hours as needed.         Marland Kitchen ibuprofen (ADVIL,MOTRIN) 200 MG tablet   Oral   Take 200 mg by mouth every 6 (six) hours as needed.         . traMADol (ULTRAM) 50 MG tablet      Take one every 8 hours for pain   20 tablet   0    BP 104/62  Pulse 72  Temp(Src) 98.2 F (36.8 C) (Oral)  Resp 20  Ht 5\' 6"  (1.676 m)  Wt 168 lb (76.204 kg)  BMI 27.13 kg/m2  SpO2 100% Physical Exam  Constitutional: She is oriented to person, place, and time. She appears well-developed.  HENT:  Head: Normocephalic.  Eyes: Conjunctivae and EOM are normal. No scleral icterus.  Neck: Neck supple. No thyromegaly present.  Cardiovascular: Normal rate and regular rhythm.  Exam reveals no gallop and no friction rub.   No murmur heard. Pulmonary/Chest: No stridor. She has no wheezes. She has no rales. She exhibits no tenderness.  Abdominal: She exhibits no distension. There is no tenderness. There is no rebound.  Musculoskeletal: Normal range of motion. She exhibits no edema.  Lymphadenopathy:    She has no cervical adenopathy.  Neurological: She is oriented  to person, place, and time. She exhibits normal muscle tone. Coordination normal.  Skin: No rash noted. No erythema.  Psychiatric: She has a normal mood and affect. Her behavior is normal.    ED Course  Procedures (including critical care time) Labs Review Labs Reviewed  CBC WITH DIFFERENTIAL - Abnormal; Notable for the following:    MCH 34.2 (*)    All other components within normal limits  COMPREHENSIVE METABOLIC PANEL  TROPONIN I  TROPONIN I   Imaging Review Dg Chest 2 View  01/26/2013   CLINICAL DATA:  Upper back pain. Right-sided chest pain for 3 months.  EXAM: CHEST  2 VIEW  COMPARISON:  None.  FINDINGS:  There is pleural thickening hand increased density at the right apex. This most likely represents scarring associated with old infection including granulomatous disease. In the absence of comparisons, it is difficult to exclude apical mass or nodule. Conventional pneumonia could produce this appearance. The cardiopericardial silhouette is within normal limits. Tortuous thoracic aorta. Left lung is clear.  IMPRESSION: Right upper lobe opacity may represent scarring or active infection. If the patient has infectious symptoms, consider a course of antibiotic therapy and repeat chest film to assess for resolution. Otherwise, noncontrast chest CT may be useful to further evaluate. This can be performed on a nonemergent basis.   Electronically Signed   By: Dereck Ligas M.D.   On: 01/26/2013 16:59   Ct Chest Wo Contrast  01/26/2013   CLINICAL DATA:  Right-sided chest pain and back pain  EXAM: CT CHEST WITHOUT CONTRAST  TECHNIQUE: Multidetector CT imaging of the chest was performed following the standard protocol without IV contrast.  COMPARISON:  Radiograph performed earlier on the same day.  FINDINGS: Visualized thyroid gland is unremarkable.  No pathologically enlarged mediastinal, hilar, or axillary lymph nodes are identified.  Limited noncontrast evaluation of the intrathoracic aorta is grossly unremarkable. Atherosclerotic plaque present within the aortic arch. Heart size is within normal limits. No pericardial effusion.  There is irregular biapical pleural scarring with bronchiectasis involving both upper lobes, greatest within the right lung apex. These findings account for previously seen abnormal densities on prior chest radiograph. No focal infiltrate seen within this region to suggest active infection. There are scattered calcifications within this region, and these findings may be related to prior granulomatous infection.  No pulmonary edema or focal infiltrate identified. There is no pleural effusion.  Subsegmental atelectasis present within the dependent portions of both lower lobes.  Calcified granuloma noted within the right hepatic lobe. The visualized upper abdomen is otherwise unremarkable.  No acute osseous abnormality identified. No focal lytic or blastic osseous lesions.  IMPRESSION: 1. Irregular biapical pleural thickening/scarring with associated bronchiectasis, architectural distortion, and calcifications. Findings are thought to be related to prior granulomatous infection (including possible tuberculosis). No focal infiltrates to suggest active pneumonia or infection identified at this time.   Electronically Signed   By: Jeannine Boga M.D.   On: 01/26/2013 19:43    EKG Interpretation    Date/Time:  Monday January 26 2013 13:58:29 EST Ventricular Rate:  80 PR Interval:  142 QRS Duration: 80 QT Interval:  374 QTC Calculation: 431 R Axis:   58 Text Interpretation:  Normal sinus rhythm Normal ECG When compared with ECG of 01-Aug-2010 10:11, No significant change was found Confirmed by Jamaria Amborn  MD, Jasiri Hanawalt ([id number]) on 01/26/2013 4:21:06 PM            MDM   1. Chest pain    Chest  pain with scarring in lungs.    Maudry Diego, MD 01/26/13 2046

## 2013-01-26 NOTE — ED Notes (Signed)
MD at bedside. 

## 2013-02-19 ENCOUNTER — Other Ambulatory Visit (HOSPITAL_COMMUNITY): Payer: Self-pay | Admitting: Physician Assistant

## 2013-02-19 DIAGNOSIS — N644 Mastodynia: Secondary | ICD-10-CM

## 2013-03-04 ENCOUNTER — Other Ambulatory Visit (HOSPITAL_COMMUNITY): Payer: Self-pay | Admitting: Physician Assistant

## 2013-03-04 ENCOUNTER — Ambulatory Visit (HOSPITAL_COMMUNITY)
Admission: RE | Admit: 2013-03-04 | Discharge: 2013-03-04 | Disposition: A | Discharge status: Home / Self Care | Payer: Self-pay | Source: Ambulatory Visit | Attending: Physician Assistant | Admitting: Physician Assistant

## 2013-03-04 DIAGNOSIS — Z139 Encounter for screening, unspecified: Secondary | ICD-10-CM

## 2013-03-04 DIAGNOSIS — N644 Mastodynia: Secondary | ICD-10-CM

## 2013-03-04 DIAGNOSIS — Z1231 Encounter for screening mammogram for malignant neoplasm of breast: Secondary | ICD-10-CM | POA: Insufficient documentation

## 2014-07-02 ENCOUNTER — Encounter (HOSPITAL_COMMUNITY): Payer: Self-pay | Admitting: *Deleted

## 2014-07-02 ENCOUNTER — Emergency Department (HOSPITAL_COMMUNITY)
Admission: EM | Admit: 2014-07-02 | Discharge: 2014-07-02 | Disposition: A | Discharge status: Home / Self Care | Payer: Self-pay | Attending: Emergency Medicine | Admitting: Emergency Medicine

## 2014-07-02 DIAGNOSIS — L237 Allergic contact dermatitis due to plants, except food: Secondary | ICD-10-CM

## 2014-07-02 DIAGNOSIS — Z8719 Personal history of other diseases of the digestive system: Secondary | ICD-10-CM | POA: Insufficient documentation

## 2014-07-02 DIAGNOSIS — L255 Unspecified contact dermatitis due to plants, except food: Secondary | ICD-10-CM | POA: Insufficient documentation

## 2014-07-02 MED ORDER — DIPHENHYDRAMINE HCL 25 MG PO CAPS
25.0000 mg | ORAL_CAPSULE | Freq: Once | ORAL | Status: AC
  Administered 2014-07-02: 25 mg via ORAL
  Filled 2014-07-02: qty 1

## 2014-07-02 MED ORDER — PREDNISONE 10 MG PO TABS: ORAL_TABLET | ORAL | Status: DC

## 2014-07-02 MED ORDER — DEXAMETHASONE SODIUM PHOSPHATE 4 MG/ML IJ SOLN
8.0000 mg | Freq: Once | INTRAMUSCULAR | Status: AC
  Administered 2014-07-02: 8 mg via INTRAMUSCULAR
  Filled 2014-07-02: qty 2

## 2014-07-02 MED ORDER — DIPHENHYDRAMINE HCL 25 MG PO TABS: 25.0000 mg | ORAL_TABLET | Freq: Four times a day (QID) | ORAL | Status: DC | PRN

## 2014-07-02 NOTE — ED Notes (Signed)
Pt got into some poison ivy? This week and she saw her PCP and was given prednisone. Daughter states that the rash seems to be getting worse than better.

## 2014-07-02 NOTE — ED Notes (Signed)
Pt has scattered red itching rash to both arms, face and legs for 1.5 weeks. Seen by Dr Romona Curls and started prednisone on 6/13. Pt has been working outside in yard.

## 2014-07-02 NOTE — Discharge Instructions (Signed)
Hiedra venenosa  (Poison Ivy) Luego de la exposicin previa a la planta. La erupcin suele aparecer 48 horas despus de la exposicin. Suelen ser bultos (ppulas) o ampollas (vesculas) en un patrn lineal. abrirse. Los ojos tambin podran hincharse. Las hinchazn es peor por la maana y mejora a medida que Magazine features editor. Deben tomarse todas las precauciones para prevenir una infeccin bacteriana (por grmenes) secundaria, que puede ocasionar cicatrices. Mantenga todas las reas abiertas secas, limpias y vendadas y cbralas con un ungento antibacteriano, en caso que lo necesite. Si no aparece una infeccin secundaria, esta dermatitis generalmente se [place] 2 o 3 semanas sin tratamiento. INSTRUCCIONES PARA EL CUIDADO DOMICILIARIO Lvese cuidadosamente con agua y jabn tan pronto como ocurra la exposicin al txico. Tiene alrededor de media hora para retirar la resina de la planta antes de que le cause el sarpullido. El lavado destruir rpidamente el aceite o antgeno que se encuentra sobre la piel y que podr causar el sarpullido. Lave enrgicamente debajo de las uas. Todo resto de resina seguir diseminando el sarpullido. No se frote la piel vigorosamente cuando lava la zona afectada. La dermatitis no se extender si retira todo el aceite de la planta que haya quedado en su cuerpo. Un sarpullido que se ha transformado en lesiones que supuran (llagas) no diseminar el sarpullido, a menos que no se haya lavado cuidadosamente. Tambin es importante lavar todas las prendas que [place]. Pueden tener alrgenos [employer]. El sarpullido volver, an varios das ms tarde. La mejor medida es evitar el contacto con la planta en el futuro. La hiedra venenosa puede reconocerse por el nmero de hojas, En general, la hiedra venenosa tiene tres hojas con ramas floridas en un tallo simple. Podr adquirir difenhidramina que es un medicamento de venta [place], y Pharmacologist segn lo necesite para Curator. No conduzca automviles si este medicamento le produce somnolencia. Consulte con el profesional que lo asiste acerca de los medicamentos que podr administrarle a los nios. SOLICITE ATENCIN MDICA SI:  Observa reas abiertas.  Enrojecimiento que se extiende ms all de la zona del sarpullido.  Una secrecin purulenta (similar al pus).  Aumento del dolor.  Desarrolla otros signos de infeccin (como fiebre). Document Released: 10/11/2004 Document Revised: 03/26/2011 [facility] Patient Information 2015 [place]. This information is not intended to replace advice given to you by your health care provider. Make sure you discuss any questions you have with your health care provider.   Start taking the prednisone taper on Sunday as the steroid shot you received tonight will cover you through tomorrow.  You may use the Benadryl as prescribed for itching.  Consider looking for Gold Bond anti-itch cream (or the generic version of this) as this is an excellent itch relief cream.  You may use calamine lotion if your rash starts draining or weeping.  This will dry it up.  Cool compresses, even ice packs can help with intense itching.  Avoid hot baths and showers which can make the itch worse.  Get rechecked if your symptoms are not improving with this treatment plan.

## 2014-07-03 NOTE — ED Provider Notes (Signed)
CSN: [id number]     Arrival date & time 07/02/14  2002 History   First MD Initiated Contact with Patient 07/02/14 2044     Chief Complaint  Patient presents with  . Poison Ivy     (Consider location/radiation/quality/duration/timing/severity/associated sxs/prior Treatment) The history is provided by the patient and a relative. The history is limited by a language barrier. Language interpreter used: Daughter gave history for mother.   Lisa Mcdowell is a 64 y.o. female presenting with persistent poison ivy rash on her bilateral arms and a small patch on her left chin.  She completed a 30 mg to 5 mg 6 day taper of prednisone today and has also been using calamine lotion and a otc anti itch cream, but the rash has not improved.  She was working outdoors the day before the rash started and was probably exposed to poison ivy/oak at that time.  She denies fevers, cough or shortness of breath. She has not found any relievers for itch which has kept her awake at night.      Past Medical History  Diagnosis Date  . Nausea & vomiting   . Cholelithiasis   . Gallstones 08/08/2010   Past Surgical History  Procedure Laterality Date  . Varicose vein surgery  2011    [facility]  . Cholecystectomy  08/08/2010    Procedure: LAPAROSCOPIC CHOLECYSTECTOMY;  Surgeon: Scherry Ran;  Location: [facility];  Service: General;  Laterality: N/A;   History reviewed. No pertinent family history. History  Substance Use Topics  . Smoking status: Never Smoker   . Smokeless tobacco: Never Used  . Alcohol Use: No   OB History    No data available     Review of Systems  Constitutional: Negative for fever and chills.  Respiratory: Negative for shortness of breath and wheezing.   Skin: Positive for rash.  Neurological: Negative for numbness.      Allergies  Review of patient's allergies indicates no known allergies.  Home Medications   Prior to Admission medications   Medication Sig Start Date End  Date Taking? Authorizing Provider  acetaminophen (TYLENOL) 500 MG tablet Take 500 mg by mouth every 6 (six) hours as needed.    Historical Provider, MD  diphenhydrAMINE (BENADRYL) 25 MG tablet Take 1 tablet (25 mg total) by mouth every 6 (six) hours as needed for itching. 07/02/14   Evalee Jefferson, PA-C  ibuprofen (ADVIL,MOTRIN) 200 MG tablet Take 200 mg by mouth every 6 (six) hours as needed.    Historical Provider, MD  predniSONE (DELTASONE) 10 MG tablet 6, 5, 4, 3, 2 then 1 tablet by mouth daily for 6 days total. 07/04/14   Evalee Jefferson, PA-C  traMADol (ULTRAM) 50 MG tablet Take one every 8 hours for pain 01/26/13   Milton Ferguson, MD   BP 135/70 mmHg  Pulse 65  Temp(Src) 98.9 F (37.2 C)  Resp 18  Ht 5' 7.5" (1.715 m)  Wt 165 lb (74.844 kg)  BMI 25.45 kg/m2  SpO2 99% Physical Exam  Constitutional: She appears well-developed and well-nourished. No distress.  HENT:  Head: Normocephalic.  Neck: Neck supple.  Cardiovascular: Normal rate.   Pulmonary/Chest: Effort normal. She has no wheezes.  Musculoskeletal: Normal range of motion. She exhibits no edema.  Skin: Rash noted. Rash is macular and vesicular.  Erythematous patches of slightly raised lesions, most with central scabbing, few with intact tiny vesicles on volar forearms and left chin. No weeping or drainage, appears dry.  Excoriations.  ED Course  Procedures (including critical care time) Labs Review Labs Reviewed - No data to display  Imaging Review No results found.   EKG Interpretation None      MDM   Final diagnoses:  Contact dermatitis due to poison ivy    Pt was given decadron injection, pt to start a higher dose prednisone taper starting tomorrow 60mg  to 10 mg, advised benadryl for itch, cool compresses, gold bond anti itch cream. Avoid scratching.  F/u with pcp or return here if sx persist or worsen.    Evalee Jefferson, PA-C 07/03/14 [place], MD 07/03/14 [phone]

## 2014-10-22 ENCOUNTER — Other Ambulatory Visit: Payer: Self-pay | Admitting: Physician Assistant

## 2014-10-22 LAB — LIPID PANEL
CHOL/HDL RATIO: 4 ratio (ref <=5.0)
CHOLESTEROL: 144 mg/dL (ref 125–200)
HDL: 36 mg/dL — AB (ref >=46)
LDL Cholesterol: 82 mg/dL (ref <130)
TRIGLYCERIDES: 128 mg/dL (ref <150)
VLDL: 26 mg/dL (ref <30)

## 2014-10-22 LAB — COMPREHENSIVE METABOLIC PANEL
ALK PHOS: 62 U/L (ref 33–130)
ALT: 12 U/L (ref 6–29)
AST: 17 U/L (ref 10–35)
Albumin: 4.3 g/dL (ref 3.6–5.1)
BUN: 17 mg/dL (ref 7–25)
CO2: 28 mmol/L (ref 20–31)
CREATININE: 0.7 mg/dL (ref 0.50–0.99)
Calcium: 9 mg/dL (ref 8.6–10.4)
Chloride: 103 mmol/L (ref 98–110)
Glucose, Bld: 96 mg/dL (ref 65–99)
POTASSIUM: 4.2 mmol/L (ref 3.5–5.3)
Sodium: 140 mmol/L (ref 135–146)
TOTAL PROTEIN: 7 g/dL (ref 6.1–8.1)
Total Bilirubin: 0.8 mg/dL (ref 0.2–1.2)

## 2014-10-28 ENCOUNTER — Encounter: Payer: Self-pay | Admitting: Physician Assistant

## 2014-10-28 ENCOUNTER — Ambulatory Visit: Payer: Self-pay | Admitting: Physician Assistant

## 2014-10-28 VITALS — BP 124/74 | HR 70 | Temp 97.7°F | Ht 66.0 in | Wt 163.5 lb

## 2014-10-28 DIAGNOSIS — K649 Unspecified hemorrhoids: Secondary | ICD-10-CM | POA: Insufficient documentation

## 2014-10-28 DIAGNOSIS — K219 Gastro-esophageal reflux disease without esophagitis: Secondary | ICD-10-CM

## 2014-10-28 DIAGNOSIS — E785 Hyperlipidemia, unspecified: Secondary | ICD-10-CM | POA: Insufficient documentation

## 2014-10-28 MED ORDER — LOVASTATIN 10 MG PO TABS: 10.0000 mg | ORAL_TABLET | Freq: Every day | ORAL | Status: DC

## 2014-10-28 NOTE — Progress Notes (Signed)
   BP 124/74 mmHg  Pulse 70  Temp(Src) 97.7 F (36.5 C)  Ht 5\' 6"  (1.676 m)  Wt 163 lb 8 oz (74.163 kg)  BMI 26.40 kg/m2  SpO2 99%   Subjective:    Patient ID: Lisa Mcdowell, female    DOB: 08-27-1950, 64 y.o.   MRN: [id number]  HPI: Lisa Mcdowell is a 64 y.o. female presenting on 10/28/2014 for Hyperlipidemia and Gastrophageal Reflux   HPI   Chief Complaint  Patient presents with  . Hyperlipidemia  . Gastrophageal Reflux    -reviewed labs with pt. Lipids great!  Relevant past medical, surgical, family and social history reviewed and updated as indicated. Interim medical history since our last visit reviewed. Allergies and medications reviewed and updated.  Review of Systems  Constitutional: Negative for fever, chills, diaphoresis, appetite change, fatigue and unexpected weight change.  HENT: Positive for hearing loss. Negative for congestion, drooling, ear pain, facial swelling, mouth sores, sneezing, sore throat, trouble swallowing and voice change.   Eyes: Negative for pain, discharge, redness, itching and visual disturbance.  Respiratory: Negative for cough, choking, shortness of breath and wheezing.   Cardiovascular: Negative for chest pain, palpitations and leg swelling.  Gastrointestinal: Negative for vomiting, abdominal pain, diarrhea, constipation and blood in stool.  Endocrine: Negative for cold intolerance, heat intolerance and polydipsia.  Genitourinary: Negative for dysuria, hematuria and decreased urine volume.  Musculoskeletal: Negative for back pain, arthralgias and gait problem.  Skin: Negative for rash.  Allergic/Immunologic: Negative for environmental allergies.  Neurological: Negative for seizures, syncope, light-headedness and headaches.  Hematological: Negative for adenopathy.  Psychiatric/Behavioral: Negative for suicidal ideas, dysphoric mood and agitation. The patient is not nervous/anxious.     Per HPI unless specifically indicated  above     Objective:    BP 124/74 mmHg  Pulse 70  Temp(Src) 97.7 F (36.5 C)  Ht 5\' 6"  (1.676 m)  Wt 163 lb 8 oz (74.163 kg)  BMI 26.40 kg/m2  SpO2 99%  Wt Readings from Last 3 Encounters:  10/28/14 163 lb 8 oz (74.163 kg)  07/02/14 165 lb (74.844 kg)  01/26/13 168 lb (76.204 kg)    Physical Exam  Constitutional: She is oriented to person, place, and time. She appears well-developed and well-nourished.  HENT:  Head: Normocephalic and atraumatic.  Neck: Neck supple.  Cardiovascular: Normal rate and regular rhythm.   Pulmonary/Chest: Effort normal and breath sounds normal.  Abdominal: Soft. Bowel sounds are normal. She exhibits no mass. There is no tenderness.  Lymphadenopathy:    She has no cervical adenopathy.  Neurological: She is alert and oriented to person, place, and time.  Skin: Skin is warm and dry.  Psychiatric: She has a normal mood and affect. Her behavior is normal.  Vitals reviewed.       Assessment & Plan:     Lisa Mcdowell was seen today for hyperlipidemia and gastrophageal reflux.  Diagnoses and all orders for this visit:  Hyperlipemia  Gastroesophageal reflux disease, esophagitis presence not specified  Other orders -     lovastatin (MEVACOR) 10 MG tablet; Take 1 tablet (10 mg total) by mouth at bedtime. tomar una tableta a la hora de acostarse    Follow up plan: Cont current meds. Return in about 3 months (around 01/28/2015) for chol, gerd.

## 2014-12-16 ENCOUNTER — Encounter (HOSPITAL_COMMUNITY): Payer: Self-pay | Admitting: Cardiology

## 2014-12-16 ENCOUNTER — Emergency Department (HOSPITAL_COMMUNITY): Payer: Self-pay

## 2014-12-16 ENCOUNTER — Emergency Department (HOSPITAL_COMMUNITY)
Admission: EM | Admit: 2014-12-16 | Discharge: 2014-12-16 | Disposition: A | Discharge status: Home / Self Care | Payer: Self-pay | Attending: Emergency Medicine | Admitting: Emergency Medicine

## 2014-12-16 DIAGNOSIS — R509 Fever, unspecified: Secondary | ICD-10-CM

## 2014-12-16 DIAGNOSIS — R197 Diarrhea, unspecified: Secondary | ICD-10-CM

## 2014-12-16 DIAGNOSIS — R109 Unspecified abdominal pain: Secondary | ICD-10-CM

## 2014-12-16 DIAGNOSIS — R112 Nausea with vomiting, unspecified: Secondary | ICD-10-CM

## 2014-12-16 DIAGNOSIS — E785 Hyperlipidemia, unspecified: Secondary | ICD-10-CM | POA: Insufficient documentation

## 2014-12-16 DIAGNOSIS — A09 Infectious gastroenteritis and colitis, unspecified: Secondary | ICD-10-CM

## 2014-12-16 DIAGNOSIS — K529 Noninfective gastroenteritis and colitis, unspecified: Secondary | ICD-10-CM | POA: Insufficient documentation

## 2014-12-16 DIAGNOSIS — K219 Gastro-esophageal reflux disease without esophagitis: Secondary | ICD-10-CM | POA: Insufficient documentation

## 2014-12-16 HISTORY — DX: Gastro-esophageal reflux disease without esophagitis: K21.9

## 2014-12-16 HISTORY — DX: Hyperlipidemia, unspecified: E78.5

## 2014-12-16 LAB — COMPREHENSIVE METABOLIC PANEL
ALT: 19 U/L (ref 14–54)
AST: 23 U/L (ref 15–41)
Albumin: 4.3 g/dL (ref 3.5–5.0)
Alkaline Phosphatase: 60 U/L (ref 38–126)
Anion gap: 12 (ref 5–15)
BILIRUBIN TOTAL: 1.1 mg/dL (ref 0.3–1.2)
BUN: 18 mg/dL (ref 6–20)
CHLORIDE: 104 mmol/L (ref 101–111)
CO2: 21 mmol/L — ABNORMAL LOW (ref 22–32)
Calcium: 8.8 mg/dL — ABNORMAL LOW (ref 8.9–10.3)
Creatinine, Ser: 0.7 mg/dL (ref 0.44–1.00)
Glucose, Bld: 139 mg/dL — ABNORMAL HIGH (ref 65–99)
POTASSIUM: 3.2 mmol/L — AB (ref 3.5–5.1)
Sodium: 137 mmol/L (ref 135–145)
TOTAL PROTEIN: 7.7 g/dL (ref 6.5–8.1)

## 2014-12-16 LAB — LIPASE, BLOOD: LIPASE: 20 U/L (ref 11–51)

## 2014-12-16 LAB — CBC
HEMATOCRIT: 38.8 % (ref 36.0–46.0)
Hemoglobin: 13.3 g/dL (ref 12.0–15.0)
MCH: 33.1 pg (ref 26.0–34.0)
MCHC: 34.3 g/dL (ref 30.0–36.0)
MCV: 96.5 fL (ref 78.0–100.0)
PLATELETS: 188 10*3/uL (ref 150–400)
RBC: 4.02 MIL/uL (ref 3.87–5.11)
RDW: 13.3 % (ref 11.5–15.5)
WBC: 10.7 10*3/uL — AB (ref 4.0–10.5)

## 2014-12-16 LAB — URINALYSIS, ROUTINE W REFLEX MICROSCOPIC
Bilirubin Urine: NEGATIVE
GLUCOSE, UA: NEGATIVE mg/dL
KETONES UR: NEGATIVE mg/dL
LEUKOCYTES UA: NEGATIVE
NITRITE: NEGATIVE
PH: 5.5 (ref 5.0–8.0)
Specific Gravity, Urine: >1.03 — ABNORMAL HIGH (ref 1.005–1.030)

## 2014-12-16 LAB — URINE MICROSCOPIC-ADD ON

## 2014-12-16 LAB — LACTIC ACID, PLASMA
LACTIC ACID, VENOUS: 1.4 mmol/L (ref 0.5–2.0)
Lactic Acid, Venous: 2 mmol/L (ref 0.5–2.0)

## 2014-12-16 MED ORDER — POTASSIUM CHLORIDE CRYS ER 20 MEQ PO TBCR
40.0000 meq | EXTENDED_RELEASE_TABLET | Freq: Once | ORAL | Status: AC
  Administered 2014-12-16: 40 meq via ORAL
  Filled 2014-12-16: qty 2

## 2014-12-16 MED ORDER — ACETAMINOPHEN 325 MG PO TABS
650.0000 mg | ORAL_TABLET | Freq: Once | ORAL | Status: AC
  Administered 2014-12-16: 650 mg via ORAL
  Filled 2014-12-16: qty 2

## 2014-12-16 MED ORDER — SODIUM CHLORIDE 0.9 % IV BOLUS (SEPSIS)
500.0000 mL | Freq: Once | INTRAVENOUS | Status: AC
  Administered 2014-12-16: 500 mL via INTRAVENOUS

## 2014-12-16 MED ORDER — METRONIDAZOLE 500 MG PO TABS: 500.0000 mg | ORAL_TABLET | Freq: Three times a day (TID) | ORAL | Status: DC

## 2014-12-16 MED ORDER — ACETAMINOPHEN 500 MG PO TABS
1000.0000 mg | ORAL_TABLET | Freq: Once | ORAL | Status: AC
  Administered 2014-12-16: 1000 mg via ORAL
  Filled 2014-12-16: qty 2

## 2014-12-16 MED ORDER — POTASSIUM CHLORIDE CRYS ER 20 MEQ PO TBCR: 40.0000 meq | EXTENDED_RELEASE_TABLET | Freq: Once | ORAL | Status: DC

## 2014-12-16 MED ORDER — CIPROFLOXACIN IN D5W 400 MG/200ML IV SOLN
400.0000 mg | Freq: Two times a day (BID) | INTRAVENOUS | Status: DC
  Administered 2014-12-16: 400 mg via INTRAVENOUS
  Filled 2014-12-16: qty 200

## 2014-12-16 MED ORDER — IOHEXOL 300 MG/ML  SOLN
100.0000 mL | Freq: Once | INTRAMUSCULAR | Status: AC | PRN
  Administered 2014-12-16: 100 mL via INTRAVENOUS

## 2014-12-16 MED ORDER — METRONIDAZOLE IN NACL 5-0.79 MG/ML-% IV SOLN
500.0000 mg | Freq: Once | INTRAVENOUS | Status: AC
  Administered 2014-12-16: 500 mg via INTRAVENOUS
  Filled 2014-12-16: qty 100

## 2014-12-16 MED ORDER — PROMETHAZINE HCL 12.5 MG PO TABS: 25.0000 mg | ORAL_TABLET | Freq: Three times a day (TID) | ORAL | Status: DC | PRN

## 2014-12-16 MED ORDER — SODIUM CHLORIDE 0.9 % IV SOLN
INTRAVENOUS | Status: DC
  Administered 2014-12-16: 15:00:00 via INTRAVENOUS

## 2014-12-16 MED ORDER — CIPROFLOXACIN HCL 500 MG PO TABS: 500.0000 mg | ORAL_TABLET | Freq: Two times a day (BID) | ORAL | Status: DC

## 2014-12-16 NOTE — ED Notes (Signed)
Right sided abdominal pain 3 months.  [facility].  Now has vomiting ,  Diarrhea times 2 days.  Per family she feels warm.

## 2014-12-16 NOTE — ED Provider Notes (Signed)
CSN: [id number]     Arrival date & time 12/16/14  1050 History   First MD Initiated Contact with Patient 12/16/14 1114     Chief Complaint  Patient presents with  . Abdominal Pain      HPI  Pt was seen at 1120.  Per pt, c/o gradual onset and worsening of persistent right abd "pain" for the past 3 days. Has been associated with multiple intermittent episodes of N/V as well as subjective home fevers/chills.  Describes the abd pain as "aching."  Denies back pain, no rash, no CP/SOB, no black or blood in stools or emesis.       Past Medical History  Diagnosis Date  . Nausea & vomiting   . Cholelithiasis   . Gallstones 08/08/2010  . Hyperlipidemia   . GERD (gastroesophageal reflux disease)    Past Surgical History  Procedure Laterality Date  . Varicose vein surgery  2011    [facility]  . Cholecystectomy  08/08/2010    Procedure: LAPAROSCOPIC CHOLECYSTECTOMY;  Surgeon: Scherry Ran;  Location: [facility];  Service: General;  Laterality: N/A;    Social History  Substance Use Topics  . Smoking status: Never Smoker   . Smokeless tobacco: Never Used  . Alcohol Use: No    Review of Systems ROS: Statement: All systems negative except as marked or noted in the HPI; Constitutional: +fever and chills. ; ; Eyes: Negative for eye pain, redness and discharge. ; ; ENMT: Negative for ear pain, hoarseness, nasal congestion, sinus pressure and sore throat. ; ; Cardiovascular: Negative for chest pain, palpitations, diaphoresis, dyspnea and peripheral edema. ; ; Respiratory: Negative for cough, wheezing and stridor. ; ; Gastrointestinal: +N/V/D, abd pain. Negative for blood in stool, hematemesis, jaundice and rectal bleeding. . ; ; Genitourinary: Negative for dysuria, flank pain and hematuria. ; ; Musculoskeletal: Negative for back pain and neck pain. Negative for swelling and trauma.; ; Skin: Negative for pruritus, rash, abrasions, blisters, bruising and skin lesion.; ; Neuro: Negative for headache,  lightheadedness and neck stiffness. Negative for weakness, altered level of consciousness , altered mental status, extremity weakness, paresthesias, involuntary movement, seizure and syncope.      Allergies  Review of patient's allergies indicates no known allergies.  Home Medications   Prior to Admission medications   Medication Sig Start Date End Date Taking? Authorizing Provider  lovastatin (MEVACOR) 10 MG tablet Take 1 tablet (10 mg total) by mouth at bedtime. tomar una tableta a la hora de acostarse 10/28/14  Yes Soyla Dryer, PA-C  pantoprazole (PROTONIX) 40 MG tablet Take 40 mg by mouth 2 (two) times daily as needed.   Yes Historical Provider, MD   BP 110/64 mmHg  Pulse 112  Temp(Src) 101.2 F (38.4 C) (Oral)  Resp 18  Ht 5\' 8"  (1.727 m)  Wt 165 lb (74.844 kg)  BMI 25.09 kg/m2  SpO2 98%   BP 118/68 mmHg  Pulse 76  Temp(Src) 98.3 F (36.8 C) (Oral)  Resp 18  Ht 5\' 8"  (1.727 m)  Wt 165 lb (74.844 kg)  BMI 25.09 kg/m2  SpO2 99%   Physical Exam  1125: Physical examination:  Nursing notes reviewed; Vital signs and O2 SAT reviewed; +febrile.;; Constitutional: Well developed, Well nourished, Uncomfortable appearing.;; Head:  Normocephalic, atraumatic; Eyes: EOMI, PERRL, No scleral icterus; ENMT: Mouth and pharynx normal, Mucous membranes dry; Neck: Supple, Full range of motion, No lymphadenopathy; Cardiovascular: Tachycardic rate and rhythm, No gallop; Respiratory: Breath sounds clear & equal bilaterally, No wheezes.  Speaking full sentences with ease, Normal respiratory effort/excursion; Chest: Nontender, Movement normal; Abdomen: Soft, +right sided abd tenderness to palp. No rebound or guarding. Nondistended, Normal bowel sounds; Genitourinary: No CVA tenderness; Extremities: Pulses normal, No tenderness, No edema, No calf edema or asymmetry.; Neuro: AA&Ox3, Major CN grossly intact.  Speech clear. No gross focal motor or sensory deficits in extremities.; Skin: Color normal,  Warm, Dry.   ED Course  Procedures (including critical care time) Labs Review   Imaging Review  I have personally reviewed and evaluated these images and lab results as part of my medical decision-making.   EKG Interpretation None      MDM  MDM Reviewed: previous chart, nursing note and vitals Reviewed previous: labs Interpretation: labs, x-ray and CT scan   Results for orders placed or performed during the hospital encounter of 12/16/14  Lipase, blood  Result Value Ref Range   Lipase 20 11 - 51 U/L  Comprehensive metabolic panel  Result Value Ref Range   Sodium 137 135 - 145 mmol/L   Potassium 3.2 (L) 3.5 - 5.1 mmol/L   Chloride 104 101 - 111 mmol/L   CO2 21 (L) 22 - 32 mmol/L   Glucose, Bld 139 (H) 65 - 99 mg/dL   BUN 18 6 - 20 mg/dL   Creatinine, Ser 0.70 0.44 - 1.00 mg/dL   Calcium 8.8 (L) 8.9 - 10.3 mg/dL   Total Protein 7.7 6.5 - 8.1 g/dL   Albumin 4.3 3.5 - 5.0 g/dL   AST 23 15 - 41 U/L   ALT 19 14 - 54 U/L   Alkaline Phosphatase 60 38 - 126 U/L   Total Bilirubin 1.1 0.3 - 1.2 mg/dL   GFR calc non Af Amer >60 >60 mL/min   GFR calc Af Amer >60 >60 mL/min   Anion gap 12 5 - 15  CBC  Result Value Ref Range   WBC 10.7 (H) 4.0 - 10.5 K/uL   RBC 4.02 3.87 - 5.11 MIL/uL   Hemoglobin 13.3 12.0 - 15.0 g/dL   HCT 38.8 36.0 - 46.0 %   MCV 96.5 78.0 - 100.0 fL   MCH 33.1 26.0 - 34.0 pg   MCHC 34.3 30.0 - 36.0 g/dL   RDW 13.3 11.5 - 15.5 %   Platelets 188 150 - 400 K/uL  Urinalysis, Routine w reflex microscopic (not at [facility])  Result Value Ref Range   Color, Urine YELLOW YELLOW   APPearance CLEAR CLEAR   Specific Gravity, Urine >1.030 (H) 1.005 - 1.030   pH 5.5 5.0 - 8.0   Glucose, UA NEGATIVE NEGATIVE mg/dL   Hgb urine dipstick LARGE (A) NEGATIVE   Bilirubin Urine NEGATIVE NEGATIVE   Ketones, ur NEGATIVE NEGATIVE mg/dL   Protein, ur TRACE (A) NEGATIVE mg/dL   Nitrite NEGATIVE NEGATIVE   Leukocytes, UA NEGATIVE NEGATIVE  Lactic acid, plasma  Result  Value Ref Range   Lactic Acid, Venous 2.0 0.5 - 2.0 mmol/L  Lactic acid, plasma  Result Value Ref Range   Lactic Acid, Venous 1.4 0.5 - 2.0 mmol/L  Urine microscopic-add on  Result Value Ref Range   Squamous Epithelial / LPF TOO NUMEROUS TO COUNT (A) NONE SEEN   WBC, UA 0-5 0 - 5 WBC/hpf   RBC / HPF TOO NUMEROUS TO COUNT 0 - 5 RBC/hpf   Bacteria, UA FEW (A) NONE SEEN   Dg Chest 2 View 12/16/2014  CLINICAL DATA:  Right-sided abdominal pain for several months. EXAM: CHEST  2 VIEW  COMPARISON:  January 26, 2013. FINDINGS: The heart size and mediastinal contours are within normal limits. No pneumothorax or pleural effusion is noted. Stable right upper lobe scarring is noted. No acute pulmonary disease is noted. The visualized skeletal structures are unremarkable. IMPRESSION: No active cardiopulmonary disease. Electronically Signed   By: Marijo Conception, M.D.   On: 12/16/2014 11:56   Ct Abdomen Pelvis W Contrast 12/16/2014  CLINICAL DATA:  Nausea, diarrhea, fever this morning, upper abdominal pain for 4 months EXAM: CT ABDOMEN AND PELVIS WITH CONTRAST TECHNIQUE: Multidetector CT imaging of the abdomen and pelvis was performed using the standard protocol following bolus administration of intravenous contrast. CONTRAST:  156mL OMNIPAQUE IOHEXOL 300 MG/ML  SOLN COMPARISON:  None. FINDINGS: Small hiatal hernia is noted. There is contrast material in distal esophagus suspicious for gastroesophageal reflux disease. Mild fatty infiltration of the liver. The patient is status postcholecystectomy. The lung bases are unremarkable. The pancreas, spleen and adrenal glands are unremarkable. Kidneys are symmetrical in size and enhancement. No hydronephrosis or hydroureter. There is umbilical hernia containing fat measures 3.5 cm without evidence of acute complication. No small bowel obstruction. No ascites or free air. No adenopathy. The uterus is atrophic. Ovaries are unremarkable. Normal appendix. No pericecal  inflammation. The terminal ileum is unremarkable. There is mild thickening of the wall of the distal transverse colon and splenic flexure of the colon. Colitis cannot be excluded. The colon is empty. Mild thickening of colonic wall in descending colon. Sigmoid colon is unremarkable. There is no distal colonic obstruction. Urinary bladder is unremarkable. Small nonspecific bilateral inguinal lymph nodes are noted. No destructive bony lesions are noted within pelvis. Sagittal images of the spine are unremarkable. Delayed renal images shows bilateral renal symmetrical excretion. IMPRESSION: 1. Small hiatal hernia. There is contrast material within distal esophagus suspicious for gastroesophageal reflux. 2. There is mild thickening of colonic wall in transverse colon and hepatic flexure of the colon. Mild thickening of colonic wall in descending colon. Colitis cannot be excluded. Clinical correlation is necessary. 3. No mesenteric fluid collection. No small bowel obstruction. Normal appendix. No pericecal inflammation. 4. Small umbilical hernia containing fat without evidence of acute complication. 5. No hydronephrosis or hydroureter. Unremarkable uterus and adnexa. Electronically Signed   By: Lahoma Crocker M.D.   On: 12/16/2014 13:00    1420:   APAP given for fever. Colitis on CT scan; will tx IV abx. Potassium repleted PO. Dx and testing d/w pt and family.  Questions answered.  Verb understanding, agreeable to evaluation by [facility] MD for admission. T/C to [facility] Dr. Jerilee Hoh, case discussed, including:  HPI, pertinent PM/SHx, VS/PE, dx testing, ED course and treatment:  Agreeable to come to ED for evaluation.   1530:  Transient hypotension responsive to IVF. 2nd lactic acid trending downward; doubt sepsis at this time.  [facility] Dr. Jerilee Hoh has evaluated pt in the ED:  States pt has tol PO in the ED without N/V, pt and family want to try to go home with meds, OK to d/c pt with rx cipro/flagyl x14 days, strict return  precautions discussed.  Pt states she feels better. Pt and family do not want to be admitted and would like to go home now. Dx and testing d/w pt and family.  Questions answered.  Verb understanding, agreeable to d/c home with outpt f/u.   Francine Graven, DO 12/19/14 [phone]

## 2014-12-16 NOTE — Progress Notes (Signed)
ANTIBIOTIC CONSULT NOTE - INITIAL  Pharmacy Consult for cipro Indication: intra abdominal infection  No Known Allergies  Patient Measurements: Height: 5\' 8"  (172.7 cm) Weight: 165 lb (74.844 kg) IBW/kg (Calculated) : 63.9  Vital Signs: Temp: 101.2 F (38.4 C) (12/01 1106) Temp Source: Oral (12/01 1106) BP: 110/64 mmHg (12/01 1106) Pulse Rate: 112 (12/01 1106) Intake/Output from previous day:   Intake/Output from this shift:    Labs:  Recent Labs  12/16/14 1142  WBC 10.7*  HGB 13.3  PLT 188  CREATININE 0.70   Estimated Creatinine Clearance: 71.7 mL/min (by C-G formula based on Cr of 0.7). No results for input(s): VANCOTROUGH, VANCOPEAK, VANCORANDOM, GENTTROUGH, GENTPEAK, GENTRANDOM, TOBRATROUGH, TOBRAPEAK, TOBRARND, AMIKACINPEAK, AMIKACINTROU, AMIKACIN in the last 72 hours.   Microbiology: No results found for this or any previous visit (from the past 720 hour(s)).  Medical History: Past Medical History  Diagnosis Date  . Nausea & vomiting   . Cholelithiasis   . Gallstones 08/08/2010  . Hyperlipidemia   . GERD (gastroesophageal reflux disease)     Medications:  See medication history Assessment: 64 yo lady to start cipro and flagyl for intra abdominal infection.  CrCl ~70 ml/min  Goal of Therapy:  Eradication of infection  Plan:  Cipro 400 mg IV q12 hours F/u clinical course and cultures  Thanks for allowing pharmacy to be a part of this patient's care.  Excell Seltzer, PharmD Clinical Pharmacist 12/16/2014,1:14 PM

## 2014-12-16 NOTE — ED Notes (Signed)
Dr. Thurnell Garbe notified of BP.

## 2014-12-16 NOTE — Consult Note (Addendum)
Requesting physician: Francine Graven, D.O.  Primary Care Physician: Soyla Dryer, PA-C  Reason for consultation: Potential admission   History of Present Illness: Patient is a 64 year old woman with history of GERD who is status post cholecystectomy who has had right upper and lower quadrant abdominal pain for the past 3-4 months following her cholecystectomy. She ran out of her Protonix about 3 days ago and states this coincided with an increase in her abdominal pain. She also had some diarrhea this morning and felt warm to touch and this prompted her daughter to bring her to the hospital for evaluation. In the hospital she was found to have a temperature of 101.2, lab work is essentially unremarkable other than a slightly decreased potassium at 3.2. Most notably her LFTs and lipase are within normal limits. She has a slight leukocytosis of 10.7. Urinalysis was negative. CT scan shows mild thickening of the transverse, descending colon unable to exclude colitis. We have been asked to see her for potential admission. She denies any nausea or vomiting. She is otherwise healthy and independent in her ADLs. It is important to note that she is Spanish speaking only.  Allergies:  No Known Allergies    Past Medical History  Diagnosis Date  . Nausea & vomiting   . Cholelithiasis   . Gallstones 08/08/2010  . Hyperlipidemia   . GERD (gastroesophageal reflux disease)     Past Surgical History  Procedure Laterality Date  . Varicose vein surgery  2011    [facility]  . Cholecystectomy  08/08/2010    Procedure: LAPAROSCOPIC CHOLECYSTECTOMY;  Surgeon: Scherry Ran;  Location: [facility];  Service: General;  Laterality: N/A;    Scheduled Meds: . potassium chloride SA  40 mEq Oral Once   Continuous Infusions: . sodium chloride Stopped (12/16/14 1521)  . ciprofloxacin 400 mg (12/16/14 1426)   PRN Meds:.  Social History:  reports that she has never smoked. She has never used smokeless  tobacco. She reports that she does not drink alcohol or use illicit drugs.  Family history: Negative for heart disease, stroke, diabetes, hypertension  Review of Systems:  Constitutional: Denies diaphoresis, appetite change and fatigue.  HEENT: Denies photophobia, eye pain, redness, hearing loss, ear pain, congestion, sore throat, rhinorrhea, sneezing, mouth sores, trouble swallowing, neck pain, neck stiffness and tinnitus.   Respiratory: Denies SOB, DOE, cough, chest tightness,  and wheezing.   Cardiovascular: Denies chest pain, palpitations and leg swelling.  Gastrointestinal: Denies nausea, vomiting, constipation, blood in stool and abdominal distention.  Genitourinary: Denies dysuria, urgency, frequency, hematuria, flank pain and difficulty urinating.  Endocrine: Denies: hot or cold intolerance, sweats, changes in hair or nails, polyuria, polydipsia. Musculoskeletal: Denies myalgias, back pain, joint swelling, arthralgias and gait problem.  Skin: Denies pallor, rash and wound.  Neurological: Denies dizziness, seizures, syncope, weakness, light-headedness, numbness and headaches.  Hematological: Denies adenopathy. Easy bruising, personal or family bleeding history  Psychiatric/Behavioral: Denies suicidal ideation, mood changes, confusion, nervousness, sleep disturbance and agitation   Physical Exam: Blood pressure 107/66, pulse 84, temperature 98.3 F (36.8 C), temperature source Oral, resp. rate 18, height 5' 8"  (1.727 m), weight 74.844 kg (165 lb), SpO2 98 %. General: Alert, awake, oriented 3 HEENT: Normocephalic, atraumatic, pupils equal round and reactive to light, extraocular movements intact, moist mucous membranes, poor dentition  Neck: Supple, no JVD, no lymphadenopathy, no bruits, no goiter. Cardiovascular: Regular rate and rhythm, no murmurs, rubs or gallops Lungs: Clear to auscultation bilaterally Abdomen: Soft, nontender to palpation, nondistended,  positive bowel sounds,  no masses or organomegaly noted Extremities: No clubbing, cyanosis or edema, positive pulses Neurologic: Grossly intact and nonfocal, I have not ambulated her   Labs on Admission:  Results for orders placed or performed during the hospital encounter of 12/16/14 (from the past 48 hour(s))  Urinalysis, Routine w reflex microscopic (not at [facility])     Status: Abnormal   Collection Time: 12/16/14 11:28 AM  Result Value Ref Range   Color, Urine YELLOW YELLOW   APPearance CLEAR CLEAR   Specific Gravity, Urine >1.030 (H) 1.005 - 1.030   pH 5.5 5.0 - 8.0   Glucose, UA NEGATIVE NEGATIVE mg/dL   Hgb urine dipstick LARGE (A) NEGATIVE   Bilirubin Urine NEGATIVE NEGATIVE   Ketones, ur NEGATIVE NEGATIVE mg/dL   Protein, ur TRACE (A) NEGATIVE mg/dL   Nitrite NEGATIVE NEGATIVE   Leukocytes, UA NEGATIVE NEGATIVE  Urine microscopic-add on     Status: Abnormal   Collection Time: 12/16/14 11:28 AM  Result Value Ref Range   Squamous Epithelial / LPF TOO NUMEROUS TO COUNT (A) NONE SEEN   WBC, UA 0-5 0 - 5 WBC/hpf   RBC / HPF TOO NUMEROUS TO COUNT 0 - 5 RBC/hpf   Bacteria, UA FEW (A) NONE SEEN  Lactic acid, plasma     Status: None   Collection Time: 12/16/14 11:32 AM  Result Value Ref Range   Lactic Acid, Venous 2.0 0.5 - 2.0 mmol/L  Lipase, blood     Status: None   Collection Time: 12/16/14 11:42 AM  Result Value Ref Range   Lipase 20 11 - 51 U/L  Comprehensive metabolic panel     Status: Abnormal   Collection Time: 12/16/14 11:42 AM  Result Value Ref Range   Sodium 137 135 - 145 mmol/L   Potassium 3.2 (L) 3.5 - 5.1 mmol/L   Chloride 104 101 - 111 mmol/L   CO2 21 (L) 22 - 32 mmol/L   Glucose, Bld 139 (H) 65 - 99 mg/dL   BUN 18 6 - 20 mg/dL   Creatinine, Ser 0.70 0.44 - 1.00 mg/dL   Calcium 8.8 (L) 8.9 - 10.3 mg/dL   Total Protein 7.7 6.5 - 8.1 g/dL   Albumin 4.3 3.5 - 5.0 g/dL   AST 23 15 - 41 U/L   ALT 19 14 - 54 U/L   Alkaline Phosphatase 60 38 - 126 U/L   Total Bilirubin 1.1 0.3 - 1.2  mg/dL   GFR calc non Af Amer >60 >60 mL/min   GFR calc Af Amer >60 >60 mL/min    Comment: (NOTE) The eGFR has been calculated using the CKD EPI equation. This calculation has not been validated in all clinical situations. eGFR's persistently <60 mL/min signify possible Chronic Kidney Disease.    Anion gap 12 5 - 15  CBC     Status: Abnormal   Collection Time: 12/16/14 11:42 AM  Result Value Ref Range   WBC 10.7 (H) 4.0 - 10.5 K/uL   RBC 4.02 3.87 - 5.11 MIL/uL   Hemoglobin 13.3 12.0 - 15.0 g/dL   HCT 38.8 36.0 - 46.0 %   MCV 96.5 78.0 - 100.0 fL   MCH 33.1 26.0 - 34.0 pg   MCHC 34.3 30.0 - 36.0 g/dL   RDW 13.3 11.5 - 15.5 %   Platelets 188 150 - 400 K/uL  Lactic acid, plasma     Status: None   Collection Time: 12/16/14  2:28 PM  Result Value Ref Range  Lactic Acid, Venous 1.4 0.5 - 2.0 mmol/L    Radiological Exams on Admission: Dg Chest 2 View  12/16/2014  CLINICAL DATA:  Right-sided abdominal pain for several months. EXAM: CHEST  2 VIEW COMPARISON:  January 26, 2013. FINDINGS: The heart size and mediastinal contours are within normal limits. No pneumothorax or pleural effusion is noted. Stable right upper lobe scarring is noted. No acute pulmonary disease is noted. The visualized skeletal structures are unremarkable. IMPRESSION: No active cardiopulmonary disease. Electronically Signed   By: Marijo Conception, M.D.   On: 12/16/2014 11:56   Ct Abdomen Pelvis W Contrast  12/16/2014  CLINICAL DATA:  Nausea, diarrhea, fever this morning, upper abdominal pain for 4 months EXAM: CT ABDOMEN AND PELVIS WITH CONTRAST TECHNIQUE: Multidetector CT imaging of the abdomen and pelvis was performed using the standard protocol following bolus administration of intravenous contrast. CONTRAST:  126m OMNIPAQUE IOHEXOL 300 MG/ML  SOLN COMPARISON:  None. FINDINGS: Small hiatal hernia is noted. There is contrast material in distal esophagus suspicious for gastroesophageal reflux disease. Mild fatty  infiltration of the liver. The patient is status postcholecystectomy. The lung bases are unremarkable. The pancreas, spleen and adrenal glands are unremarkable. Kidneys are symmetrical in size and enhancement. No hydronephrosis or hydroureter. There is umbilical hernia containing fat measures 3.5 cm without evidence of acute complication. No small bowel obstruction. No ascites or free air. No adenopathy. The uterus is atrophic. Ovaries are unremarkable. Normal appendix. No pericecal inflammation. The terminal ileum is unremarkable. There is mild thickening of the wall of the distal transverse colon and splenic flexure of the colon. Colitis cannot be excluded. The colon is empty. Mild thickening of colonic wall in descending colon. Sigmoid colon is unremarkable. There is no distal colonic obstruction. Urinary bladder is unremarkable. Small nonspecific bilateral inguinal lymph nodes are noted. No destructive bony lesions are noted within pelvis. Sagittal images of the spine are unremarkable. Delayed renal images shows bilateral renal symmetrical excretion. IMPRESSION: 1. Small hiatal hernia. There is contrast material within distal esophagus suspicious for gastroesophageal reflux. 2. There is mild thickening of colonic wall in transverse colon and hepatic flexure of the colon. Mild thickening of colonic wall in descending colon. Colitis cannot be excluded. Clinical correlation is necessary. 3. No mesenteric fluid collection. No small bowel obstruction. Normal appendix. No pericecal inflammation. 4. Small umbilical hernia containing fat without evidence of acute complication. 5. No hydronephrosis or hydroureter. Unremarkable uterus and adnexa. Electronically Signed   By: LLahoma CrockerM.D.   On: 12/16/2014 13:00    Assessment/Plan  Right upper and lower quadrant abdominal pain  -This pain has some degree of chronicity, although acute worsening over the past couple days, coupled with diarrhea and fever certainly  point towards an infectious source. -CT scan shows evidence for possible mild infectious colitis. -Patient has defervesced and her temperature is currently 98.3, she denies having had nausea or vomiting at any point during her acute illness. She believes she will not have any difficulty tolerating oral antibiotics. -She did have some transient hypotension with a systolic of 89 to mid 987Gwhich resolved quickly with a small fluid bolus. Upon questioning patient states that she has always had low normal blood pressure and usually is around the 1811-572systolic range. -I believe we can attempt a trial of outpatient management at this moment. Have advised Cipro and Flagyl for 14 days with close follow-up with her primary care physician, daughter who was present at time of examination agrees to schedule  this. -She has been advised to return to the hospital if after 48 hours she continues to have elevated temperatures, increased abdominal pain, inability to tolerate anything by mouth, excessive vomiting or significant increase in diarrhea. -Above plan has been discussed with EDP, Dr. Thurnell Garbe.  GERD -Continue Protonix  Hypokalemia -We'll give 40 mEq of potassium.  Time Spent on Consultation: 85 minutes  [facility]  [phone] 12/16/2014, 3:27 PM

## 2014-12-16 NOTE — Discharge Instructions (Signed)
°Emergency Department Resource Guide °1) Find a Doctor and Pay Out of Pocket °Although you won't have to find out who is covered by your insurance plan, it is a good idea to ask around and get recommendations. You will then need to call the office and see if the doctor you have chosen will accept you as a new patient and what types of options they offer for patients who are self-pay. Some doctors offer discounts or will set up payment plans for their patients who do not have insurance, but you will need to ask so you aren't surprised when you get to your appointment. ° °2) Contact Your Local Health Department °Not all health departments have doctors that can see patients for sick visits, but many do, so it is worth a call to see if yours does. If you don't know where your local health department is, you can check in your phone book. The CDC also has a tool to help you locate your state's health department, and many state websites also have listings of all of their local health departments. ° °3) Find a Walk-in Clinic °If your illness is not likely to be very severe or complicated, you may want to try a walk in clinic. These are popping up all over the country in pharmacies, drugstores, and shopping centers. They're usually staffed by nurse practitioners or physician assistants that have been trained to treat common illnesses and complaints. They're usually fairly quick and inexpensive. However, if you have serious medical issues or chronic medical problems, these are probably not your best option. ° °No Primary Care Doctor: °- Call [facility] Connect at  832-8000 - they can help you locate a primary care doctor that  accepts your insurance, provides certain services, etc. °- Physician Referral Service- 1-800-533-3463 ° °Chronic Pain Problems: °Organization         Address  Phone   Notes  °[facility]  ([phone]) [phone] Patients need to be referred by their primary care doctor.  ° °Medication  Assistance: °Organization         Address  Phone   Notes  °[place] [facility] [address] °[place], [place] [address] (336) 641-8030 --Must be a resident of [place] °-- Must have NO insurance coverage whatsoever (no Medicaid/ Medicare, etc.) °-- The pt. MUST have a primary care doctor that directs their care regularly and follows them in the community °  °[facility]  (866) 331-1348   °[employer]  (888) 892-1162   ° °Agencies that provide inexpensive medical care: °Organization         Address  Phone   Notes  °[facility]  (336) 832-8035   °[facility]    (336) 832-7272   °[facility] [address] °[place], [place] [address] (336) 832-4777   °[facility] [address], °[place] (336) 271-4999   °[facility]    (336) 373-0678   °[facility]    (336) 272-1050   °[facility] ° [address], [place] Phone:  (336) 832-4444, Fax:  (336) 832-4440 Hours of Operation:  9 am - 6 pm, M-F.  Also accepts Medicaid/Medicare and self-pay.  °[facility] ° [address],  Phone: (336) 832-3150, Fax: (336) [phone]. Hours of Operation:  8:30 am - 5:30 pm, M-F.  Also accepts Medicaid and self-pay.  °[facility] [place] [address], [place] Phone: (336) 878-6027   °[facility] [address], [place], [place] (336)723-1848, Ext. 123 Mondays & Thursdays: 7-9 AM.  First 15 patients are seen on a first come, first serve basis. °  ° °Medicaid-accepting [place] Providers: ° °Organization         Address  Phone   Notes  °[facility] [address], [place] (336) 641-2100 Also accepts self-pay patients.  °[facility] [address], [place] ° (336) 856-9996   °[facility] [address], [place]  (336) 288-8857   °[facility] [address] [place] [address], [place] (336) 299-7000   °Veita Bland [address], [place]  ° (336) 373-1557 Only accepts [place] Access Medicaid patients after they have their name applied to their card.  ° °Self-Pay (no insurance) in [place]: ° °Organization         Address  Phone   Notes  °Sickle Cell Patients, [facility] [address], [place] (336) 832-1970   °[facility] [address], [place] (336) 832-4400   ° [facility] ° [address], [place] (336) 992-4800   °[facility]/Dr. Osei-Bonsu ° [address] [place] [address], [place] or [address], [place] (336) [phone] Phone number for both [place] and [place] locations is the same.  °[facility] [address], [place] (336) 299-0000   °[facility] [address] [place] [address], [place] or [address] (336) 852-7530 °(336) 878-2260   °[facility] [address], [place] (336) 350-1642, phone; (336) 294-5005, fax Sees patients 1st and 3rd Saturday of every month.  Must not qualify for public or private insurance (i.e. Medicaid, Medicare, [place] Health Choice, Veterans' Benefits) • Household income should be no more than 200% of the poverty level •The clinic cannot treat you if you are pregnant or think you are pregnant • Sexually transmitted diseases are not treated at the clinic.  ° ° °Dental Care: °Organization         Address  Phone  Notes  °[place] [facility] [address], [place] (336) 641-6152 Accepts children up to age 21 who are enrolled in Medicaid or [place] Health Choice; pregnant women with a Medicaid card; and children who have applied for Medicaid or [place] Health Choice, but were declined, whose parents can pay a reduced fee at time of service.  °[place]  [facility] [place]  [address], [place] (336) 641-7733 Accepts children up to age 21 who are enrolled in Medicaid or [place] Health Choice; pregnant women with a Medicaid card; and children who have applied for Medicaid or [place] Health Choice, but were declined, whose parents can pay a reduced fee at time of service.  °[facility] ° [address],  (336) 641-4533 Patients are seen by appointment only. Walk-ins are not accepted. [facility] will see patients 18 years of age and older. °Monday - Tuesday (8am-5pm) °Most Wednesdays (8:30-5pm) °$30 per visit, cash only  °[facility] ° [address], [place] (336) [phone] Patients are seen by appointment only. Walk-ins are not accepted. [facility] will see patients 18 years of age and older. °One   Wednesday Evening (Monthly: Volunteer Based).  $30 per visit, cash only  °[facility]  (919) 537-3737 for adults; Children under age 4, call Graduate Pediatric Dentistry at ([phone]) [phone]. Children aged 4-14, please call ([phone]) [phone] to request a pediatric application. ° Dental services are provided in all areas of dental care including fillings, crowns and bridges, complete and partial dentures, implants, gum treatment, root canals, and extractions. Preventive care is also provided. Treatment is provided to both adults and children. °Patients are selected via a lottery and there is often a waiting list. °  °[facility] [address], °[place] ° (336) 763-8833 www.drcivils.[email or web address] °  °[facility] [address], [place], [place] (336)723-1848, Ext. 123 Second and Fourth Thursday of each month, opens at 6:30 AM; Clinic ends at 9 AM.  Patients are seen on a first-come first-served basis, and a limited number are seen during each clinic.  ° °[facility] ° [address], [place], [place] (336) [phone]    Eligibility Requirements °You must have lived in [place], [place], or [place] counties for at least the last three months. °  You cannot be eligible for state or federal sponsored healthcare insurance, including Veterans Administration, Medicaid, or Medicare. °  You generally cannot be eligible for healthcare insurance through your employer.  °  How to apply: °Eligibility screenings are held every Tuesday and Wednesday afternoon from 1:00 pm until 4:00 pm. You do not need an appointment for the interview!  °[facility] [address], [place], [place] 336-631-2330   °[facility]  336-342-8273   °[facility]  336-703-3100   °[facility]  336-570-6415   ° °Behavioral Health Resources in the Community: °Intensive Outpatient Programs °Organization         Address  Phone  Notes  °[facility] [address], [place], [place] 336-878-6098   °[facility] [address], [place], [place] 336-832-9800   °[facility] [address], [place], [place] ° 336-882-2125   °[facility] [address],  °[place], [place] 1-800-853-5163 or 336-641-4981   °Substance Abuse Resources °Organization         Address  Phone  Notes  °[facility]  336-882-2125   °[facility]  336-784-9470   °[facility]  336-285-9073   °[facility]  336-845-3988   °Residential & Outpatient Substance Abuse Program  1-800-659-3381   °Psychological Services °Organization         Address  Phone  Notes  ° Health  336- 832-9600   °[facility]  336- 378-7881   °[facility] [address], [place] 1-800-853-5163 or 336-641-4981   ° °Mobile Crisis Teams °Organization         Address  Phone  Notes  °[facility], Mobile Crisis Care Unit  1-877-626-1772   °Assertive °[facility] ° [address]  [place], [place] 336-834-9664   °Sharon DeEsch [address] °[place] 336-554-5454   ° °Self-Help/Support Groups °Organization         Address  Phone             Notes  °[facility]  - variety of support groups  336- 373-1402 Call for more information  °Narcotics Anonymous (NA), [facility] [address], °[place]  2 meetings at this location  ° °  Residential Treatment Programs Organization         Address  Phone  Notes  [facility] [address]    [place]  1-413-222-4324   [facility]  [address] [place] [id number], [place], [place]   [place], [place] (337)580-0276 Admissions: 8am-3pm M-F  [facility] [place] [address],    [place], [place] [id number]   [facility] [address] [place], [place], [place]   [facility] [address]  [place], [place]   [facility] Intensive Outpatient [address], [place], [place]   [facility] ([place].,  [place], [place] 1-272-847-5042 or [phone]   [facility] [address], [place], [place] Accepts Medicaid  [facility] [address],  [employer] [place] 1-401-283-6768 Substance Abuse/Addiction Treatment   [facility]         Address  Phone  Notes  [facility]  919-517-7206   Domenic Schwab, PhD [address] [place], [place]   484-549-4860 or [phone]   [facility] [place], [place] (423)128-8185   [facility] [address], [place], [place] 702-564-4532 Insurance/Medicaid/sponsorship through [facility] Families [address] [place]                                    [place], [place] 610-827-2648 [place] [address], [place] [phone]    Dr. Adele Schilder  [phone]   [facility] Dept. 1) [address] [place] 2) [place] 3)  [address], [place] [phone]  [phone]   [place] [phone] or [phone] (After Hours)      Take the prescriptions as directed.  Increase your fluid intake (ie:  Gatoraide) for the next few days.  Eat a bland diet and advance to your regular diet slowly as you can tolerate it.   Avoid full strength juices, as well as milk and milk products until your diarrhea has resolved.   Call your regular medical doctor tomorrow to schedule a follow up appointment in the next 2 days.  Return to the Emergency Department immediately if not improving (or even worsening) despite taking the medicines as prescribed, any black or bloody stool or vomit, if you continue to have fevers over "101," or for any other concerns.

## 2014-12-16 NOTE — ED Notes (Signed)
MD at bedside. 

## 2014-12-16 NOTE — ED Notes (Signed)
Pt made aware to return if symptoms worsen or if any life threatening symptoms occur.   

## 2014-12-17 LAB — URINE CULTURE

## 2014-12-22 ENCOUNTER — Encounter: Payer: Self-pay | Admitting: Physician Assistant

## 2014-12-22 ENCOUNTER — Ambulatory Visit: Payer: Self-pay | Admitting: Physician Assistant

## 2014-12-22 VITALS — BP 108/78 | HR 75 | Temp 97.9°F | Ht 66.0 in | Wt 164.0 lb

## 2014-12-22 DIAGNOSIS — K219 Gastro-esophageal reflux disease without esophagitis: Secondary | ICD-10-CM

## 2014-12-22 DIAGNOSIS — R109 Unspecified abdominal pain: Secondary | ICD-10-CM

## 2014-12-22 MED ORDER — PANTOPRAZOLE SODIUM 40 MG PO TBEC: 40.0000 mg | DELAYED_RELEASE_TABLET | Freq: Two times a day (BID) | ORAL | Status: DC

## 2014-12-22 NOTE — Progress Notes (Signed)
BP 108/78 mmHg  Pulse 75  Temp(Src) 97.9 F (36.6 C)  Ht 5\' 6"  (1.676 m)  Wt 164 lb (74.39 kg)  BMI 26.48 kg/m2  SpO2 98%   Subjective:    Patient ID: Lisa Mcdowell, female    DOB: [birth year], 64 y.o.   MRN: [id number]  HPI: Lisa Mcdowell is a 64 y.o. female presenting on 12/22/2014 for Follow-up and Medication Problem   HPI Chief Complaint  Patient presents with  . Follow-up    pt states she feels a lot better  . Medication Problem    pt wants to know if she needs to continue meds given at the hospital   [facility] Arvin Collard is translating(  Pt is feeling better than she did when she went to the ER several days ago.  Relevant past medical, surgical, family and social history reviewed and updated as indicated. Interim medical history since our last visit reviewed. Allergies and medications reviewed and updated.  Current outpatient prescriptions:  .  ciprofloxacin (CIPRO) 500 MG tablet, Take 1 tablet (500 mg total) by mouth 2 (two) times daily., Disp: 28 tablet, Rfl: 0 .  lovastatin (MEVACOR) 10 MG tablet, Take 1 tablet (10 mg total) by mouth at bedtime. tomar una tableta a la hora de [place], Disp: 30 tablet, Rfl: 6 .  metroNIDAZOLE (FLAGYL) 500 MG tablet, Take 1 tablet (500 mg total) by mouth 3 (three) times daily., Disp: 42 tablet, Rfl: 0 .  promethazine (PHENERGAN) 12.5 MG tablet, Take 2 tablets (25 mg total) by mouth every 8 (eight) hours as needed for nausea or vomiting., Disp: 6 tablet, Rfl: 0 .  pantoprazole (PROTONIX) 40 MG tablet, Take 1 tablet (40 mg total) by mouth 2 (two) times daily. Tome una tableta por boca dos veces diarias, Disp: 60 tablet, Rfl: 3   Review of Systems  Constitutional: Positive for appetite change. Negative for fever, chills, diaphoresis, fatigue and unexpected weight change.  HENT: Negative for congestion, dental problem, drooling, ear pain, facial swelling, hearing loss, mouth sores, sneezing, sore throat, trouble swallowing and voice  change.   Eyes: Positive for itching and visual disturbance. Negative for pain, discharge and redness.  Respiratory: Negative for cough, choking, shortness of breath and wheezing.   Cardiovascular: Negative for chest pain, palpitations and leg swelling.  Gastrointestinal: Positive for diarrhea and constipation. Negative for vomiting, abdominal pain and blood in stool.  Endocrine: Positive for cold intolerance. Negative for heat intolerance and polydipsia.  Genitourinary: Negative for dysuria, hematuria and decreased urine volume.  Musculoskeletal: Positive for back pain and arthralgias. Negative for gait problem.  Skin: Negative for rash.  Allergic/Immunologic: Negative for environmental allergies.  Neurological: Positive for headaches. Negative for seizures, syncope and light-headedness.  Hematological: Negative for adenopathy.  Psychiatric/Behavioral: Negative for suicidal ideas, dysphoric mood and agitation. The patient is not nervous/anxious.     Per HPI unless specifically indicated above     Objective:    BP 108/78 mmHg  Pulse 75  Temp(Src) 97.9 F (36.6 C)  Ht 5\' 6"  (1.676 m)  Wt 164 lb (74.39 kg)  BMI 26.48 kg/m2  SpO2 98%  Wt Readings from Last 3 Encounters:  12/22/14 164 lb (74.39 kg)  12/16/14 165 lb (74.844 kg)  10/28/14 163 lb 8 oz (74.163 kg)    Physical Exam  Constitutional: She is oriented to person, place, and time. She appears well-developed and well-nourished.  HENT:  Head: Normocephalic and atraumatic.  Neck: Neck supple.  Cardiovascular: Normal rate and regular  rhythm.   Pulmonary/Chest: Effort normal and breath sounds normal.  Abdominal: Soft. Bowel sounds are normal. She exhibits no distension and no mass. There is no tenderness. There is no rebound and no guarding.  obese  Lymphadenopathy:    She has no cervical adenopathy.  Neurological: She is alert and oriented to person, place, and time.  Skin: Skin is warm and dry.  Psychiatric: She has a  normal mood and affect. Her behavior is normal.  Vitals reviewed.       Assessment & Plan:    Encounter Diagnoses  Name Primary?  . Abdominal pain, unspecified abdominal location Yes  . Gastroesophageal reflux disease, esophagitis presence not specified     -Abdominal pain- improved. -Pt to Finish her antibiotics -she will Get back on protonix -f/u January as scheduled. rto sooner prn worsening or new symptoms

## 2015-01-27 ENCOUNTER — Encounter: Payer: Self-pay | Admitting: Physician Assistant

## 2015-01-27 ENCOUNTER — Ambulatory Visit: Payer: Self-pay | Admitting: Physician Assistant

## 2015-01-27 VITALS — BP 124/76 | HR 67 | Temp 97.9°F | Ht 66.0 in | Wt 162.0 lb

## 2015-01-27 DIAGNOSIS — G250 Essential tremor: Secondary | ICD-10-CM

## 2015-01-27 DIAGNOSIS — K219 Gastro-esophageal reflux disease without esophagitis: Secondary | ICD-10-CM

## 2015-01-27 DIAGNOSIS — E785 Hyperlipidemia, unspecified: Secondary | ICD-10-CM

## 2015-01-27 DIAGNOSIS — H6123 Impacted cerumen, bilateral: Secondary | ICD-10-CM

## 2015-01-27 MED ORDER — PANTOPRAZOLE SODIUM 40 MG PO TBEC: 40.0000 mg | DELAYED_RELEASE_TABLET | Freq: Two times a day (BID) | ORAL | Status: DC

## 2015-01-27 NOTE — Progress Notes (Signed)
BP 124/76 mmHg  Pulse 67  Temp(Src) 97.9 F (36.6 C)  Ht 5\' 6"  (1.676 m)  Wt 162 lb (73.483 kg)  BMI 26.16 kg/m2  SpO2 98%   Subjective:    Patient ID: Lisa Mcdowell, female    DOB: [birth year], 65 y.o.   MRN: [id number]  HPI: Lisa Mcdowell is a 65 y.o. female presenting on 01/27/2015 for Gastroesophageal Reflux and Hyperlipidemia   HPI Pt states she is doing well.  She takes the protonix bid if she is hurting and just takes it qd if she isn't.  She says she has pain about 3 day/week.  Pt states she has been feeling nervious lately and requests med for this.  She also c/o R ear pain x  Maybe 3 wk  Relevant past medical, surgical, family and social history reviewed and updated as indicated. Interim medical history since our last visit reviewed. Allergies and medications reviewed and updated.  Current outpatient prescriptions:  .  hydrocortisone cream 1 %, Apply 1 application topically Nightly., Disp: , Rfl:  .  lovastatin (MEVACOR) 10 MG tablet, Take 1 tablet (10 mg total) by mouth at bedtime. tomar una tableta a la hora de [place], Disp: 30 tablet, Rfl: 6 .  pantoprazole (PROTONIX) 40 MG tablet, Take 1 tablet (40 mg total) by mouth 2 (two) times daily. Tome una tableta por boca dos veces diarias, Disp: 60 tablet, Rfl: 3 .  phenylephrine-shark liver oil-mineral oil-petrolatum (PREPARATION H) 0.25-3-14-71.9 % rectal ointment, Place 1 application rectally 2 (two) times daily as needed for hemorrhoids., Disp: , Rfl:   Review of Systems  Constitutional: Negative for fever, chills, diaphoresis, appetite change, fatigue and unexpected weight change.  HENT: Positive for ear pain. Negative for congestion, dental problem, drooling, facial swelling, hearing loss, mouth sores, sneezing, sore throat, trouble swallowing and voice change.   Eyes: Positive for pain and itching. Negative for discharge, redness and visual disturbance.  Respiratory: Negative for cough, choking, shortness of  breath and wheezing.   Cardiovascular: Negative for chest pain, palpitations and leg swelling.  Gastrointestinal: Negative for vomiting, abdominal pain, diarrhea, constipation and blood in stool.  Endocrine: Negative for cold intolerance, heat intolerance and polydipsia.  Genitourinary: Negative for dysuria, hematuria and decreased urine volume.  Musculoskeletal: Negative for back pain, arthralgias and gait problem.  Skin: Negative for rash.  Allergic/Immunologic: Negative for environmental allergies.  Neurological: Negative for seizures, syncope, light-headedness and headaches.  Hematological: Negative for adenopathy.  Psychiatric/Behavioral: Negative for suicidal ideas, dysphoric mood and agitation. The patient is not nervous/anxious.     Per HPI unless specifically indicated above     Objective:    BP 124/76 mmHg  Pulse 67  Temp(Src) 97.9 F (36.6 C)  Ht 5\' 6"  (1.676 m)  Wt 162 lb (73.483 kg)  BMI 26.16 kg/m2  SpO2 98%  Wt Readings from Last 3 Encounters:  01/27/15 162 lb (73.483 kg)  12/22/14 164 lb (74.39 kg)  12/16/14 165 lb (74.844 kg)    Physical Exam  Constitutional: She is oriented to person, place, and time. She appears well-developed and well-nourished.  HENT:  Head: Normocephalic and atraumatic.  Right Ear: Hearing, tympanic membrane and external ear normal. A foreign body (cerumen) is present.  Left Ear: Hearing, tympanic membrane and external ear normal. A foreign body (cerumen) is present.  Nose: Nose normal.  Mouth/Throat: Uvula is midline and oropharynx is clear and moist. No oropharyngeal exudate.  Neck: Neck supple.  Cardiovascular: Normal rate and regular rhythm.  Pulmonary/Chest: Effort normal and breath sounds normal. She has no wheezes.  Abdominal: Soft. Bowel sounds are normal. She exhibits no mass. There is no tenderness.  Musculoskeletal: She exhibits no edema.  Lymphadenopathy:    She has no cervical adenopathy.  Neurological: She is alert  and oriented to person, place, and time.  Skin: Skin is warm and dry.  Psychiatric: She has a normal mood and affect. Her behavior is normal.  Vitals reviewed.   phq-9 score 3 Gad-7 score 2    Assessment & Plan:   Encounter Diagnoses  Name Primary?  . Gastroesophageal reflux disease, esophagitis presence not specified Yes  . Hyperlipidemia   . Cerumen impaction, bilateral   . Essential tremor     -Take protonix bid every day. -Get labs drawn -Ear lavage done -phq-9/gad 7 done.  Further questioning reveals that pt is not nervous as in anxiety or depression, but has been having some tremors.  Discussed that hers is not bad yet so will monitor for now -F/u 1 mo to check GERD

## 2015-02-04 LAB — CBC WITH DIFFERENTIAL/PLATELET
BASOS PCT: 0 % (ref 0–1)
Basophils Absolute: 0 10*3/uL (ref 0.0–0.1)
Eosinophils Absolute: 0.1 10*3/uL (ref 0.0–0.7)
Eosinophils Relative: 2 % (ref 0–5)
HEMATOCRIT: 40.2 % (ref 36.0–46.0)
HEMOGLOBIN: 13.1 g/dL (ref 12.0–15.0)
LYMPHS PCT: 35 % (ref 12–46)
Lymphs Abs: 2.1 10*3/uL (ref 0.7–4.0)
MCH: 32.1 pg (ref 26.0–34.0)
MCHC: 32.6 g/dL (ref 30.0–36.0)
MCV: 98.5 fL (ref 78.0–100.0)
MONO ABS: 0.4 10*3/uL (ref 0.1–1.0)
MONOS PCT: 7 % (ref 3–12)
MPV: 13.1 fL — ABNORMAL HIGH (ref 8.6–12.4)
NEUTROS ABS: 3.3 10*3/uL (ref 1.7–7.7)
NEUTROS PCT: 56 % (ref 43–77)
Platelets: 222 10*3/uL (ref 150–400)
RBC: 4.08 MIL/uL (ref 3.87–5.11)
RDW: 14.1 % (ref 11.5–15.5)
WBC: 5.9 10*3/uL (ref 4.0–10.5)

## 2015-02-04 LAB — COMPREHENSIVE METABOLIC PANEL
ALBUMIN: 4.4 g/dL (ref 3.6–5.1)
ALT: 13 U/L (ref 6–29)
AST: 16 U/L (ref 10–35)
Alkaline Phosphatase: 61 U/L (ref 33–130)
BUN: 16 mg/dL (ref 7–25)
CALCIUM: 9.5 mg/dL (ref 8.6–10.4)
CO2: 27 mmol/L (ref 20–31)
Chloride: 103 mmol/L (ref 98–110)
Creat: 0.71 mg/dL (ref 0.50–0.99)
GLUCOSE: 90 mg/dL (ref 65–99)
Potassium: 4.9 mmol/L (ref 3.5–5.3)
SODIUM: 138 mmol/L (ref 135–146)
TOTAL PROTEIN: 7.3 g/dL (ref 6.1–8.1)
Total Bilirubin: 0.8 mg/dL (ref 0.2–1.2)

## 2015-02-04 LAB — COMPLETE METABOLIC PANEL WITH GFR
ALBUMIN: 4.3 g/dL (ref 3.6–5.1)
ALK PHOS: 68 U/L (ref 33–130)
ALT: 11 U/L (ref 6–29)
AST: 16 U/L (ref 10–35)
BUN: 15 mg/dL (ref 7–25)
CHLORIDE: 104 mmol/L (ref 98–110)
CO2: 24 mmol/L (ref 20–31)
Calcium: 9.4 mg/dL (ref 8.6–10.4)
Creat: 0.7 mg/dL (ref 0.50–0.99)
GFR, Est African American: >89 mL/min (ref >=60)
GLUCOSE: 89 mg/dL (ref 65–99)
POTASSIUM: 4.9 mmol/L (ref 3.5–5.3)
SODIUM: 139 mmol/L (ref 135–146)
TOTAL PROTEIN: 7.5 g/dL (ref 6.1–8.1)
Total Bilirubin: 0.8 mg/dL (ref 0.2–1.2)

## 2015-02-04 LAB — LIPID PANEL
CHOL/HDL RATIO: 3.5 ratio (ref <=5.0)
Cholesterol: 144 mg/dL (ref 125–200)
Cholesterol: 157 mg/dL (ref 125–200)
HDL: 41 mg/dL — ABNORMAL LOW (ref >=46)
HDL: 42 mg/dL — AB (ref >=46)
LDL CALC: 90 mg/dL (ref <130)
LDL Cholesterol: 78 mg/dL (ref <130)
Total CHOL/HDL Ratio: 3.7 Ratio (ref <=5.0)
Triglycerides: 124 mg/dL (ref <150)
Triglycerides: 126 mg/dL (ref <150)
VLDL: 25 mg/dL (ref <30)
VLDL: 25 mg/dL (ref <30)

## 2015-02-06 DIAGNOSIS — G250 Essential tremor: Secondary | ICD-10-CM | POA: Insufficient documentation

## 2015-02-28 ENCOUNTER — Ambulatory Visit: Payer: Self-pay | Admitting: Physician Assistant

## 2015-02-28 ENCOUNTER — Encounter: Payer: Self-pay | Admitting: Physician Assistant

## 2015-02-28 VITALS — BP 118/76 | HR 68 | Temp 97.7°F | Ht 66.0 in | Wt 166.8 lb

## 2015-02-28 DIAGNOSIS — R1084 Generalized abdominal pain: Secondary | ICD-10-CM

## 2015-02-28 DIAGNOSIS — K219 Gastro-esophageal reflux disease without esophagitis: Secondary | ICD-10-CM

## 2015-02-28 DIAGNOSIS — E785 Hyperlipidemia, unspecified: Secondary | ICD-10-CM

## 2015-02-28 NOTE — Progress Notes (Signed)
BP 118/76 mmHg  Pulse 68  Temp(Src) 97.7 F (36.5 C)  Ht 5' 6"  (1.676 m)  Wt 166 lb 12.8 oz (75.66 kg)  BMI 26.94 kg/m2  SpO2 98%   Subjective:    Patient ID: Lisa Mcdowell, female    DOB: 09/13/1950, 65 y.o.   MRN: [id number]  HPI: Lisa Mcdowell is a 65 y.o. female presenting on 02/28/2015 for Gastroesophageal Reflux and Hyperlipidemia   HPI Pt states that she it taking her protonix bid every day now.  She is still having pain.  Says she has the abd pain every day.  Maybe an hour daily  Otherwise pt is feeling well  Relevant past medical, surgical, family and social history reviewed and updated as indicated. Interim medical history since our last visit reviewed. Allergies and medications reviewed and updated.   Current outpatient prescriptions:  .  lovastatin (MEVACOR) 10 MG tablet, Take 1 tablet (10 mg total) by mouth at bedtime. tomar una tableta a la hora de [place], Disp: 30 tablet, Rfl: 6 .  pantoprazole (PROTONIX) 40 MG tablet, Take 1 tablet (40 mg total) by mouth 2 (two) times daily. Tome una tableta por boca dos veces diarias, Disp: 60 tablet, Rfl: 3 .  phenylephrine-shark liver oil-mineral oil-petrolatum (PREPARATION H) 0.25-3-14-71.9 % rectal ointment, Place 1 application rectally 2 (two) times daily as needed for hemorrhoids., Disp: , Rfl:    Review of Systems  Constitutional: Negative for fever, chills, diaphoresis, appetite change, fatigue and unexpected weight change.  HENT: Negative for congestion, dental problem, drooling, ear pain, facial swelling, hearing loss, mouth sores, sneezing, sore throat, trouble swallowing and voice change.   Eyes: Negative for pain, discharge, redness, itching and visual disturbance.  Respiratory: Negative for cough, choking, shortness of breath and wheezing.   Cardiovascular: Negative for chest pain, palpitations and leg swelling.  Gastrointestinal: Positive for abdominal pain and constipation. Negative for vomiting,  diarrhea and blood in stool.  Endocrine: Positive for cold intolerance and heat intolerance. Negative for polydipsia.  Genitourinary: Negative for dysuria, hematuria and decreased urine volume.  Musculoskeletal: Positive for back pain and arthralgias. Negative for gait problem.  Skin: Negative for rash.  Allergic/Immunologic: Negative for environmental allergies.  Neurological: Negative for seizures, syncope, light-headedness and headaches.  Hematological: Negative for adenopathy.  Psychiatric/Behavioral: Negative for suicidal ideas, dysphoric mood and agitation. The patient is not nervous/anxious.     Per HPI unless specifically indicated above     Objective:    BP 118/76 mmHg  Pulse 68  Temp(Src) 97.7 F (36.5 C)  Ht 5' 6"  (1.676 m)  Wt 166 lb 12.8 oz (75.66 kg)  BMI 26.94 kg/m2  SpO2 98%  Wt Readings from Last 3 Encounters:  02/28/15 166 lb 12.8 oz (75.66 kg)  01/27/15 162 lb (73.483 kg)  12/22/14 164 lb (74.39 kg)    Physical Exam  Constitutional: She is oriented to person, place, and time. She appears well-developed and well-nourished.  HENT:  Head: Normocephalic and atraumatic.  Neck: Neck supple.  Cardiovascular: Normal rate and regular rhythm.   Pulmonary/Chest: Effort normal and breath sounds normal.  Abdominal: Soft. Bowel sounds are normal. She exhibits no mass. There is no hepatosplenomegaly. There is generalized tenderness. There is no rigidity, no rebound, no guarding and no CVA tenderness.  Musculoskeletal: She exhibits no edema.  Lymphadenopathy:    She has no cervical adenopathy.  Neurological: She is alert and oriented to person, place, and time.  Skin: Skin is warm and dry.  Psychiatric: She has a normal mood and affect. Her behavior is normal.  Vitals reviewed.   Results for orders placed or performed in visit on 01/27/15  Lipid Profile  Result Value Ref Range   Cholesterol 157 125 - 200 mg/dL   Triglycerides 126 <150 mg/dL   HDL 42 (L) >=46  mg/dL   Total CHOL/HDL Ratio 3.7 <=5.0 Ratio   VLDL 25 <30 mg/dL   LDL Cholesterol 90 <130 mg/dL  COMPLETE METABOLIC PANEL WITH GFR  Result Value Ref Range   Sodium 139 135 - 146 mmol/L   Potassium 4.9 3.5 - 5.3 mmol/L   Chloride 104 98 - 110 mmol/L   CO2 24 20 - 31 mmol/L   Glucose, Bld 89 65 - 99 mg/dL   BUN 15 7 - 25 mg/dL   Creat 0.70 0.50 - 0.99 mg/dL   Total Bilirubin 0.8 0.2 - 1.2 mg/dL   Alkaline Phosphatase 68 33 - 130 U/L   AST 16 10 - 35 U/L   ALT 11 6 - 29 U/L   Total Protein 7.5 6.1 - 8.1 g/dL   Albumin 4.3 3.6 - 5.1 g/dL   Calcium 9.4 8.6 - 10.4 mg/dL   GFR, Est African American >89 >=60 mL/min   GFR, Est Non African American >89 >=60 mL/min  CBC w/Diff/Platelet  Result Value Ref Range   WBC 5.9 4.0 - 10.5 K/uL   RBC 4.08 3.87 - 5.11 MIL/uL   Hemoglobin 13.1 12.0 - 15.0 g/dL   HCT 40.2 36.0 - 46.0 %   MCV 98.5 78.0 - 100.0 fL   MCH 32.1 26.0 - 34.0 pg   MCHC 32.6 30.0 - 36.0 g/dL   RDW 14.1 11.5 - 15.5 %   Platelets 222 150 - 400 K/uL   MPV 13.1 (H) 8.6 - 12.4 fL   Neutrophils Relative % 56 43 - 77 %   Neutro Abs 3.3 1.7 - 7.7 K/uL   Lymphocytes Relative 35 12 - 46 %   Lymphs Abs 2.1 0.7 - 4.0 K/uL   Monocytes Relative 7 3 - 12 %   Monocytes Absolute 0.4 0.1 - 1.0 K/uL   Eosinophils Relative 2 0 - 5 %   Eosinophils Absolute 0.1 0.0 - 0.7 K/uL   Basophils Relative 0 0 - 1 %   Basophils Absolute 0.0 0.0 - 0.1 K/uL   Smear Review Criteria for review not met   Comprehensive metabolic panel  Result Value Ref Range   Sodium 138 135 - 146 mmol/L   Potassium 4.9 3.5 - 5.3 mmol/L   Chloride 103 98 - 110 mmol/L   CO2 27 20 - 31 mmol/L   Glucose, Bld 90 65 - 99 mg/dL   BUN 16 7 - 25 mg/dL   Creat 0.71 0.50 - 0.99 mg/dL   Total Bilirubin 0.8 0.2 - 1.2 mg/dL   Alkaline Phosphatase 61 33 - 130 U/L   AST 16 10 - 35 U/L   ALT 13 6 - 29 U/L   Total Protein 7.3 6.1 - 8.1 g/dL   Albumin 4.4 3.6 - 5.1 g/dL   Calcium 9.5 8.6 - 10.4 mg/dL  Lipid panel  Result  Value Ref Range   Cholesterol 144 125 - 200 mg/dL   Triglycerides 124 <150 mg/dL   HDL 41 (L) >=46 mg/dL   Total CHOL/HDL Ratio 3.5 <=5.0 Ratio   VLDL 25 <30 mg/dL   LDL Cholesterol 78 <130 mg/dL      Assessment & Plan:  Encounter Diagnoses  Name Primary?  . Generalized abdominal pain Yes  . Gastroesophageal reflux disease, esophagitis presence not specified   . Hyperlipidemia    -reviewed labs with pt -cholesterol is good- continue lovastatin -refer pt to GI for persistant abd pain -continue with protonix bid -cone discount application given to pt and she is counseled to turn it in asap -f/u 6 wk- rto sooner prn worsening or new symptoms

## 2015-03-07 ENCOUNTER — Encounter: Payer: Self-pay | Admitting: Gastroenterology

## 2015-03-24 ENCOUNTER — Other Ambulatory Visit: Payer: Self-pay

## 2015-03-24 ENCOUNTER — Ambulatory Visit (INDEPENDENT_AMBULATORY_CARE_PROVIDER_SITE_OTHER): Payer: Self-pay | Admitting: Gastroenterology

## 2015-03-24 ENCOUNTER — Encounter: Payer: Self-pay | Admitting: Gastroenterology

## 2015-03-24 VITALS — BP 145/75 | HR 71 | Temp 98.3°F | Ht 65.0 in | Wt 163.8 lb

## 2015-03-24 DIAGNOSIS — Z1211 Encounter for screening for malignant neoplasm of colon: Secondary | ICD-10-CM

## 2015-03-24 NOTE — Progress Notes (Signed)
CC'ED TO PCP 

## 2015-03-24 NOTE — Progress Notes (Signed)
Primary Care Physician:  Soyla Dryer, PA-C Primary Gastroenterologist:  Dr. Oneida Alar   Chief Complaint  Patient presents with  . Abdominal Pain    right side    HPI:   Lisa Mcdowell is a 65 y.o. female presenting today at the request of her PCP secondary to abdominal pain.   Has pain in right side of abdomen. Points to RLQ. Present about 1 year. If "takes medicine doctor gave me, it goes away". Taking Protonix BID. Not worse with eating. No nausea or vomiting. Sometimes will have a BM one day then next day won't. States she heard you should go every day. Baseline for patient would be every day. No rectal bleeding. Had lost weight but then started Protonix and started eating better. No reflux symptoms. No dysphagia. No prior colonoscopy or upper endoscopy.    Past Medical History  Diagnosis Date  . Nausea & vomiting   . Cholelithiasis   . Gallstones 08/08/2010  . Hyperlipidemia   . GERD (gastroesophageal reflux disease)     Past Surgical History  Procedure Laterality Date  . Varicose vein surgery  2011    [facility]  . Cholecystectomy  08/08/2010    Procedure: LAPAROSCOPIC CHOLECYSTECTOMY;  Surgeon: Scherry Ran;  Location: [facility];  Service: General;  Laterality: N/A;    Current Outpatient Prescriptions  Medication Sig Dispense Refill  . lovastatin (MEVACOR) 10 MG tablet Take 1 tablet (10 mg total) by mouth at bedtime. tomar una tableta a la hora de acostarse 30 tablet 6  . pantoprazole (PROTONIX) 40 MG tablet Take 1 tablet (40 mg total) by mouth 2 (two) times daily. Tome una tableta por boca dos veces diarias 60 tablet 3  . phenylephrine-shark liver oil-mineral oil-petrolatum (PREPARATION H) 0.25-3-14-71.9 % rectal ointment Place 1 application rectally 2 (two) times daily as needed for hemorrhoids. Reported on 03/24/2015     No current facility-administered medications for this visit.    Allergies as of 03/24/2015  . (No Known Allergies)    Family History    Problem Relation Age of Onset  . Colon cancer Neg Hx     Social History   Social History  . Marital Status: Married    Spouse Name: N/A  . Number of Children: N/A  . Years of Education: N/A   Occupational History  . Not on file.   Social History Main Topics  . Smoking status: Never Smoker   . Smokeless tobacco: Never Used  . Alcohol Use: No  . Drug Use: No  . Sexual Activity: Not on file   Other Topics Concern  . Not on file   Social History Narrative    Review of Systems: Gen: see HPI  CV: Denies chest pain, heart palpitations, peripheral edema, syncope.  Resp: Denies shortness of breath at rest or with exertion. Denies wheezing or cough.  GI: see HPI  GU : Denies urinary burning, urinary frequency, urinary hesitancy MS: knee pain  Derm: Denies rash, itching, dry skin Psych: Denies depression, anxiety, memory loss, and confusion Heme: see HPI   Physical Exam: BP 145/75 mmHg  Pulse 71  Temp(Src) 98.3 F (36.8 C) (Oral)  Ht 5\' 5"  (1.651 m)  Wt 163 lb 12.8 oz (74.299 kg)  BMI 27.26 kg/m2 General:   Alert and oriented. Pleasant and cooperative. Well-nourished and well-developed.  Head:  Normocephalic and atraumatic. Eyes:  Without icterus, sclera clear and conjunctiva pink.  Ears:  Normal auditory acuity. Nose:  No deformity, discharge,  or lesions. Mouth:  No deformity or lesions, oral mucosa pink.  Lungs:  Clear to auscultation bilaterally. No wheezes, rales, or rhonchi. No distress.  Heart:  S1, S2 present without murmurs appreciated.  Abdomen:  +BS, soft, very mild TTP RLQ. Umbilical hernia easily reducible. . No HSM noted. No guarding or rebound. No masses appreciated.  Rectal:  Deferred  Msk:  Symmetrical without gross deformities. Normal posture. Extremities:  Without edema. Neurologic:  Alert and  oriented x4;  grossly normal neurologically. Psych:  Alert and cooperative. Normal mood and affect.  Dec 2016 CT abd/pelvis with contrast 1. Small  hiatal hernia. There is contrast material within distal esophagus suspicious for gastroesophageal reflux. 2. There is mild thickening of colonic wall in transverse colon and hepatic flexure of the colon. Mild thickening of colonic wall in descending colon. Colitis cannot be excluded. Clinical correlation is necessary. 3. No mesenteric fluid collection. No small bowel obstruction. Normal appendix. No pericecal inflammation. 4. Small umbilical hernia containing fat without evidence of acute complication. 5. No hydronephrosis or hydroureter. Unremarkable uterus and adnexa.  Lab Results  Component Value Date   ALT 11 02/03/2015   AST 16 02/03/2015   ALKPHOS 68 02/03/2015   BILITOT 0.8 02/03/2015   Lab Results  Component Value Date   CREATININE 0.70 02/03/2015   BUN 15 02/03/2015   NA 139 02/03/2015   K 4.9 02/03/2015   CL 104 02/03/2015   CO2 24 02/03/2015    Lab Results  Component Value Date   WBC 5.9 02/03/2015   HGB 13.1 02/03/2015   HCT 40.2 02/03/2015   MCV 98.5 02/03/2015   PLT 222 02/03/2015

## 2015-03-24 NOTE — Assessment & Plan Note (Signed)
65 year old female presenting with chronic RLQ discomfort. May have some mild constipation underlying. Has noticed vague change in bowel habits with more constipation than her baseline. CT on file from Dec 2016 with mild thickening of colonic wall in transverse colon and hepatic flexure of colon, which was performed during acute episode of diarrhea. No anemia on recent labs. No rectal bleeding. No prior colonoscopy. Will trial supplemental fiber, add Miralax on any given day, and proceed with initial screening colonoscopy. No concerning upper GI symptoms currently.   Proceed with colonoscopy with Dr. Oneida Alar in the near future. The risks, benefits, and alternatives have been discussed in detail with the patient. They state understanding and desire to proceed.

## 2015-03-24 NOTE — Patient Instructions (Signed)
We have scheduled you for a colonoscopy with Dr. Oneida Alar.   I recommend taking supplemental fiber every day such as Metamucil or Benefiber. Drink at least 8 glasses of water a day. If you need to, you may take 1 capful of Miralax on the days that you feel constipated.

## 2015-04-11 ENCOUNTER — Encounter: Payer: Self-pay | Admitting: Physician Assistant

## 2015-04-11 ENCOUNTER — Ambulatory Visit: Payer: Self-pay | Admitting: Physician Assistant

## 2015-04-11 VITALS — BP 134/80 | HR 67 | Temp 97.7°F | Ht 66.0 in | Wt 163.3 lb

## 2015-04-11 DIAGNOSIS — R109 Unspecified abdominal pain: Secondary | ICD-10-CM

## 2015-04-11 DIAGNOSIS — K219 Gastro-esophageal reflux disease without esophagitis: Secondary | ICD-10-CM

## 2015-04-11 DIAGNOSIS — E785 Hyperlipidemia, unspecified: Secondary | ICD-10-CM

## 2015-04-11 NOTE — Progress Notes (Signed)
BP 134/80 mmHg  Pulse 67  Temp(Src) 97.7 F (36.5 C)  Ht 5\' 6"  (1.676 m)  Wt 163 lb 4.8 oz (74.072 kg)  BMI 26.37 kg/m2  SpO2 99%   Subjective:    Patient ID: Lisa Mcdowell, female    DOB: Jan 04, 1951, 65 y.o.   MRN: [id number]  HPI: Lisa Mcdowell is a 65 y.o. female presenting on 04/11/2015 for Abdominal Pain   HPI Chief Complaint  Patient presents with  . Abdominal Pain    pt states abd pain iis much better. pt is getting a colonoscopy procedure done this Friday     pt stays she turned in her cone discount application  Pt was seen by GI and is scheduled for colonoscopy this week  Relevant past medical, surgical, family and social history reviewed and updated as indicated. Interim medical history since our last visit reviewed. Allergies and medications reviewed and updated.  Current outpatient prescriptions:  .  lovastatin (MEVACOR) 10 MG tablet, Take 1 tablet (10 mg total) by mouth at bedtime. tomar una tableta a la hora de [place], Disp: 30 tablet, Rfl: 6 .  pantoprazole (PROTONIX) 40 MG tablet, Take 1 tablet (40 mg total) by mouth 2 (two) times daily. Tome una tableta por [employer] veces diarias (Patient taking differently: Take 40-80 mg by mouth daily. Tome una tableta por boca dos veces diarias), Disp: 60 tablet, Rfl: 3 .  phenylephrine-shark liver oil-mineral oil-petrolatum (PREPARATION H) 0.25-3-14-71.9 % rectal ointment, Place 1 application rectally daily. Reported on 03/24/2015, Disp: , Rfl:    Review of Systems  Constitutional: Negative for fever, chills, diaphoresis, appetite change, fatigue and unexpected weight change.  HENT: Negative for congestion, dental problem, drooling, ear pain, facial swelling, hearing loss, mouth sores, sneezing, sore throat, trouble swallowing and voice change.   Eyes: Negative for pain, discharge, redness, itching and visual disturbance.  Respiratory: Negative for cough, choking, shortness of breath and wheezing.    Cardiovascular: Negative for chest pain, palpitations and leg swelling.  Gastrointestinal: Negative for vomiting, abdominal pain, diarrhea, constipation and blood in stool.  Endocrine: Negative for cold intolerance, heat intolerance and polydipsia.  Genitourinary: Negative for dysuria, hematuria and decreased urine volume.  Musculoskeletal: Negative for back pain, arthralgias and gait problem.  Skin: Negative for rash.  Allergic/Immunologic: Negative for environmental allergies.  Neurological: Negative for seizures, syncope, light-headedness and headaches.  Hematological: Negative for adenopathy.  Psychiatric/Behavioral: Negative for suicidal ideas, dysphoric mood and agitation. The patient is not nervous/anxious.     Per HPI unless specifically indicated above     Objective:    BP 134/80 mmHg  Pulse 67  Temp(Src) 97.7 F (36.5 C)  Ht 5\' 6"  (1.676 m)  Wt 163 lb 4.8 oz (74.072 kg)  BMI 26.37 kg/m2  SpO2 99%  Wt Readings from Last 3 Encounters:  04/11/15 163 lb 4.8 oz (74.072 kg)  03/24/15 163 lb 12.8 oz (74.299 kg)  02/28/15 166 lb 12.8 oz (75.66 kg)    Physical Exam  Constitutional: She is oriented to person, place, and time. She appears well-developed and well-nourished.  HENT:  Head: Normocephalic and atraumatic.  Neck: Neck supple.  Cardiovascular: Normal rate and regular rhythm.   Pulmonary/Chest: Effort normal and breath sounds normal.  Abdominal: Soft. Bowel sounds are normal. She exhibits no mass. There is no hepatosplenomegaly. There is no tenderness.  Musculoskeletal: She exhibits no edema.  Lymphadenopathy:    She has no cervical adenopathy.  Neurological: She is alert and oriented to  person, place, and time.  Skin: Skin is warm and dry.  Psychiatric: She has a normal mood and affect. Her behavior is normal.  Vitals reviewed.       Assessment & Plan:   Encounter Diagnoses  Name Primary?  . Gastroesophageal reflux disease, esophagitis presence not  specified Yes  . Abdominal pain, unspecified abdominal location   . Hyperlipidemia      F/u 2 month for cholesterol- discussed with  Her that her next appt will be her last appointment at [facility]

## 2015-04-15 ENCOUNTER — Encounter (HOSPITAL_COMMUNITY): Payer: Self-pay

## 2015-04-15 ENCOUNTER — Ambulatory Visit (HOSPITAL_COMMUNITY)
Admission: RE | Admit: 2015-04-15 | Discharge: 2015-04-15 | Disposition: A | Discharge status: Home / Self Care | Payer: Self-pay | Source: Ambulatory Visit | Attending: Gastroenterology | Admitting: Gastroenterology

## 2015-04-15 ENCOUNTER — Encounter (HOSPITAL_COMMUNITY)
Admission: RE | Disposition: A | Discharge status: Home / Self Care | Payer: Self-pay | Source: Ambulatory Visit | Attending: Gastroenterology

## 2015-04-15 DIAGNOSIS — Z79899 Other long term (current) drug therapy: Secondary | ICD-10-CM | POA: Insufficient documentation

## 2015-04-15 DIAGNOSIS — D122 Benign neoplasm of ascending colon: Secondary | ICD-10-CM | POA: Insufficient documentation

## 2015-04-15 DIAGNOSIS — K635 Polyp of colon: Secondary | ICD-10-CM

## 2015-04-15 DIAGNOSIS — K621 Rectal polyp: Secondary | ICD-10-CM | POA: Insufficient documentation

## 2015-04-15 DIAGNOSIS — D124 Benign neoplasm of descending colon: Secondary | ICD-10-CM | POA: Insufficient documentation

## 2015-04-15 DIAGNOSIS — E785 Hyperlipidemia, unspecified: Secondary | ICD-10-CM | POA: Insufficient documentation

## 2015-04-15 DIAGNOSIS — D123 Benign neoplasm of transverse colon: Secondary | ICD-10-CM | POA: Insufficient documentation

## 2015-04-15 DIAGNOSIS — K219 Gastro-esophageal reflux disease without esophagitis: Secondary | ICD-10-CM | POA: Insufficient documentation

## 2015-04-15 DIAGNOSIS — Z1211 Encounter for screening for malignant neoplasm of colon: Secondary | ICD-10-CM

## 2015-04-15 DIAGNOSIS — D125 Benign neoplasm of sigmoid colon: Secondary | ICD-10-CM

## 2015-04-15 HISTORY — PX: COLONOSCOPY: SHX5424

## 2015-04-15 SURGERY — COLONOSCOPY
Anesthesia: Moderate Sedation

## 2015-04-15 MED ORDER — SODIUM CHLORIDE 0.9 % IV SOLN
INTRAVENOUS | Status: DC
  Administered 2015-04-15: 20 mL/h via INTRAVENOUS

## 2015-04-15 MED ORDER — STERILE WATER FOR IRRIGATION IR SOLN
Status: DC | PRN
  Administered 2015-04-15: 10:00:00

## 2015-04-15 MED ORDER — MEPERIDINE HCL 100 MG/ML IJ SOLN
INTRAMUSCULAR | Status: DC | PRN
  Administered 2015-04-15 (×2): 25 mg via INTRAVENOUS

## 2015-04-15 MED ORDER — MIDAZOLAM HCL 5 MG/5ML IJ SOLN
INTRAMUSCULAR | Status: AC
  Filled 2015-04-15: qty 10

## 2015-04-15 MED ORDER — MEPERIDINE HCL 100 MG/ML IJ SOLN
INTRAMUSCULAR | Status: AC
  Filled 2015-04-15: qty 2

## 2015-04-15 MED ORDER — MIDAZOLAM HCL 5 MG/5ML IJ SOLN
INTRAMUSCULAR | Status: DC | PRN
  Administered 2015-04-15: 2 mg via INTRAVENOUS
  Administered 2015-04-15: 1 mg via INTRAVENOUS
  Administered 2015-04-15: 2 mg via INTRAVENOUS

## 2015-04-15 NOTE — Discharge Instructions (Signed)
You have HEMORRHOIDES. YOU HAD NEUVE Plipos en el colon REMOTO.   FOLLOW A Dieta con alto contenido de [place]. AVOID ITEMS THAT CAUSE BLOATING. SEE INFO BELOW.  YOUR BIOPSY RESULTS WILL BE AVAILABLE IN MY CHART APR 3 AND MY OFFICE WILL CONTACT YOU IN 10-14 DAYS WITH YOUR RESULTS.   Next Colonoscopa in 5-10 anos.  Colonoscopa: cuidados posteriores (Colonoscopy, Care After) Estas indicaciones le proporcionan informacin general acerca de cmo deber cuidarse despus del procedimiento. El mdico tambin podr darle instrucciones especficas. Comunquese con el mdico si tiene algn problema o tiene preguntas despus del procedimiento. CUIDADOS EN EL HOGAR  No conduzca durante 24horas.  No firme papeles importantes ni use maquinaria pesada durante 24horas.  Puede ducharse.  Puede retomar las actividades habituales, pero hgalo ms despacio durante las primeras 24horas.  Durante las primeras 24horas, descanse con frecuencia.  Camine o pngase compresas tibias en el vientre (abdomen) si tiene clicos intestinales o gases.  Beba suficiente lquido para mantener el pis (orina) claro o de color amarillo plido.  Retome su dieta normal. No coma comidas pesadas ni fritas.  No tome alcohol durante 24horas, o segn el mdico le indique.  Tome solo los medicamentos segn le haya indicado el mdico. Si se obtuvo una muestra de tejido (biopsia) durante el procedimiento:   No tome aspirina ni anticoagulantes durante 7das, o segn el mdico le indique.  No tome alcohol durante 7das, o segn el mdico le indique.  Consuma alimentos livianos durante las primeras 24horas. SOLICITE AYUDA SI: An hay una pequea cantidad de sangre en la materia fecal (heces) 2 o 3das despus del procedimiento. SOLICITE AYUDA DE INMEDIATO SI:  Hay ms que una pequea cantidad de [employer] materia fecal.  Observa grumos de tejido (cogulos de [place]) en la materia fecal.  Tiene el vientre  inflamado (hinchado).  Tiene malestar estomacal (nuseas) o vomita.  Tiene fiebre.  Siente que Conservation officer, historic buildings en el vientre empeora y no se alivia con los medicamentos.   Dieta con alto contenido de fibra  (High Cardinal Health Diet)  La fibra se encuentra en frutas, verduras y granos. Una dieta con alto contenido en fibras se favorece con la adicin de ms granos enteros, legumbres, frutas y verduras en su dieta. La cantidad recomendada de fibra para los hombres adultos es de 38 g por da. Para las mujeres adultas es de 25 g por da. Las [employer] y las que amamantan deben consumir 66 gramos de fibra por Training and development officer. Si usted tiene un problema digestivo o intestinal, consulte a su mdico antes de la adicin de alimentos ricos en fibra a su dieta. Coma una variedad de alimentos ricos en fibra en lugar de slo unos pocos.  OBJETIVO  Aumentar la masa fecal.  Tener deposiciones ms regulares para evitar el estreimiento.  Reducir el colesterol.  Para evitar comer en exceso. [place]?  En caso de estreimiento y hemorroides.  En caso de diverticulosis no complicada (enfermedad intestinal) y en el sndrome del colon irritable.  Si necesita ayuda para el control de [place].  Si desea mejorar su dieta como medida de proteccin contra la aterosclerosis, la diabetes y Science writer. [place] y cereales integrales.  Frutas, como las [place], [place], pltanos, fresas, Development worker, community y peras.  Verduras, como guisantes, zanahorias, batatas, remolachas, brcoli, repollo, espinacas y alcauciles.  Legumbres, las arvejas, soja, lentejas.  Almendras. [place] DE LOS ALIMENTOS  Almidones y granos / Fibra Diettica (g)  Cheerios, 1 taza / 3 g  Corn Flakes, 1 taza / 0,7 g  Arroz inflado, 1  tazas / 0,3 g  Harina de avena instantnea (cocida),  taza / 2 g  Cereal de trigo escarchado, 1 taza / 5,1 g  Arroz marrn grano largo (cocido), 1 taza / 3,5 g  Arroz blanco grano largo  (cocido), 1 taza / 0,6 g  Macarrones enriquecidos (cocidos), 1 taza / 2,5 g Legumbres / Fibra Diettica (g)  Frijoles cocidos (enlatados, crudos o vegetarianos),  taza / 5,2 g  Frijoles (enlatados),  taza / 6,8 g  Frijoles pintos (cocidos),  taza / 5,5 g Panes y Administrator / Heritage manager (g)  Galletas de graham o miel, 2 plazas / 0,7 g  Galletitas saladas, 3 unidades / 0,3 g  Pretzels salados comunes, 10 pedazos / 1,8 g  Pan integral, 1 rebanada / 1,9 g  Pan blanco, 1 rebanada / 0,7 g  Pan con pasas, 1 rebanada / 1,2 g  Bagel 3 oz / 2 g  Tortilla de harina, 1 oz / 0.9 g  Tortilla de maz, 1 pequea / 1,5 g  Pan de amburguesa o hot dog, 1 pequeo / 0,9 g Frutas / Fibra Diettica (g)  Manzana con piel, 1 mediana / 4,4 g  Pur de [employer],  taza / 1,5 g  [place],  mediano / 1,5 g  Uvas, 10 uvas / 0,4 g  Naranja, 1 pequea / 2,3 g  Pasas, 1,5 oz / 1.6 g  Meln, 1 taza / 1,4 g Vegetales / Fibra Diettica (g)  Judas verdes (en conserva),  taza / 1,3 g  Zanahorias (cocido),  taza / 2,3 g  Broccoli (cocido),  taza / 2,8 g  Guisantes (cocidos),  taza / 4,4 g  Pur de papas,  taza / 1,6 g  Lechuga, 1 taza / 0,5 g  Maz (en lata),  taza / 1,6 g  Tomate,  taza / 1,1 g 1 cup / 3 g.  Plipos en el colon  (Colon Polyps)  Los plipos son masas de tejido que crecen dentro del cuerpo. Los plipos pueden desarrollarse en el intestino grueso (colon). La mayora de los plipos son no cancerosos (benignos). Sin embargo, algunos plipos pueden convertirse en cancerosos con el tiempo. Los plipos que sean ms grandes que un guisante pueden ser [employer]. Para estar seguros, los mdicos extirpan y [employer] plipos.  CAUSAS  Se forman cuando ciertas mutaciones genticas hacen que las clulas se desarrollen y se dividan por dems.  FACTORES DE RIESGO  Hay un nmero de factores de riesgo que pueden aumentar las probabilidades de padecer plipos en el colon. Ellos son:    Ser mayor de 77 aos de edad.  Historia familiar de cncer o plipos de colon.  Ciertas enfermedades crnicas como la colitis o la enfermedad de Crohn.  Tener sobrepeso.  El hbito de fumar.  El sedentarismo.  Beber alcohol en exceso. SNTOMAS  La mayor parte de los plipos no causa sntomas. Si se presentan sntomas, stos pueden ser:  [employer] materia fecal. Heces de color rojo oscuro o negro.  Constipacin o diarrea que duran ms de 1 semana. DIAGNSTICO  [place] persona con frecuencia no sabe que tiene plipos [employer] su mdico los halla durante un examen fsico de [place]. El mdico puede usar 4 tipos de pruebas para Hydrographic surveyor plipos:  Examen Musician. Se colocar guantes y palpar el interior del recto. Esta prueba detectar solo plipos en  el recto.  Enema de bario. El mdico introduce un lquido llamado bario en el recto y luego toma radiografas del colon. El bario hace que el colon se vea blanco. Los plipos son de color oscuro, por lo que son fciles de Chiropodist.  Sigmoideoscopia. Se coloca un tubo delgado y flexible (sigmoideoscopio) en el recto. Este sigmoideoscopio tiene [place] fuente de luz y Ardelia Mems pequea cmara de video. El mdico [place] el sigmoideoscopio para observar el ltimo tercio del colon.  Colonoscopa. Esta prueba es similar a la sigmoideoscopia, pero el mdico examina todo el colon. Este es el mtodo ms frecuente para Hydrographic surveyor y extirpar los plipos. TRATAMIENTO  Todo plipo ser extirpado Daneil Dan sigmoideoscopa o una colonoscopa. Luego se analizan para Heritage manager.  PREVENCIN  Para disminuir los riesgos de volver a Best boy plipos en el colon:  Coma mucha fruta y [place]. Evite las comidas grasas.  No fume.  Evite consumir alcohol.  [place].  Baje de peso segn las indicaciones de su mdico.  Consuma mucho clcio y folato. Las comidas que contienen calcio son la [place], los quesos y el brcoli. Las comidas  que contienen folato son los garbanzos, los frijoles rojos y Nurse, mental health.

## 2015-04-15 NOTE — H&P (Signed)
  Primary Care Physician:  Soyla Dryer, PA-C Primary Gastroenterologist:  Dr. Oneida Alar  Pre-Procedure History & Physical: HPI:  Lisa Mcdowell is a 65 y.o. female here for [place].  Past Medical History  Diagnosis Date  . Nausea & vomiting   . Cholelithiasis   . Gallstones 08/08/2010  . Hyperlipidemia   . GERD (gastroesophageal reflux disease)     Past Surgical History  Procedure Laterality Date  . Varicose vein surgery  2011    [facility]  . Cholecystectomy  08/08/2010    Procedure: LAPAROSCOPIC CHOLECYSTECTOMY;  Surgeon: Scherry Ran;  Location: [facility];  Service: General;  Laterality: N/A;    Prior to Admission medications   Medication Sig Start Date End Date Taking? Authorizing Provider  lovastatin (MEVACOR) 10 MG tablet Take 1 tablet (10 mg total) by mouth at bedtime. tomar una tableta a la hora de acostarse 10/28/14   Soyla Dryer, PA-C  pantoprazole (PROTONIX) 40 MG tablet Take 1 tablet (40 mg total) by mouth 2 (two) times daily. Tome una tableta por boca dos veces diarias Patient taking differently: Take 40-80 mg by mouth daily. Tome una tableta por boca dos veces diarias 01/27/15   Soyla Dryer, PA-C  phenylephrine-shark liver oil-mineral oil-petrolatum (PREPARATION H) 0.25-3-14-71.9 % rectal ointment Place 1 application rectally daily. Reported on 03/24/2015    Historical Provider, MD    Allergies as of 03/24/2015  . (No Known Allergies)    Family History  Problem Relation Age of Onset  . Colon cancer Neg Hx     Social History   Social History  . Marital Status: Married    Spouse Name: N/A  . Number of Children: N/A  . Years of Education: N/A   Occupational History  . Not on file.   Social History Main Topics  . Smoking status: Never Smoker   . Smokeless tobacco: Never Used  . Alcohol Use: No  . Drug Use: No  . Sexual Activity: Not on file   Other Topics Concern  . Not on file   Social History Narrative    Review of  Systems: See HPI, otherwise negative ROS   Physical Exam: BP 146/67 mmHg  Pulse 74  Resp 15  SpO2 100% General:   Alert,  pleasant and cooperative in NAD Head:  Normocephalic and atraumatic. Neck:  Supple; Lungs:  Clear throughout to auscultation.    Heart:  Regular rate and rhythm. Abdomen:  Soft, nontender and nondistended. Normal bowel sounds, without guarding, and without rebound.   Neurologic:  Alert and  oriented x4;  grossly normal neurologically.  Impression/Plan:     SCREENING  Plan:  1. TCS TODAY

## 2015-04-15 NOTE — Progress Notes (Signed)
REVIEWED-NO ADDITIONAL RECOMMENDATIONS. 

## 2015-04-16 NOTE — Op Note (Signed)
[facility] Patient Name: Lisa Mcdowell Procedure Date: 04/15/2015 11:12 AM MRN: [id number] Date of Birth: 08/05/1950 Attending MD: Barney Drain , MD CSN: [id number] Age: 65 Admit Type: Ambulatory Procedure:                Colonoscopy Indications:              Screening for colorectal malignant neoplasm Providers:                Barney Drain, MD Referring MD:             Soyla Dryer PA-C, MD (Referring MD) Medicines:                Meperidine 50 mg IV, Midazolam 5 mg IV Complications:            No immediate complications. Estimated Blood Loss:     Estimated blood loss was minimal. Procedure:                Pre-Anesthesia Assessment:                           - Prior to the procedure, a History and Physical                            was performed, and patient medications and                            allergies were reviewed. The patient's tolerance of                            previous anesthesia was also reviewed. The risks                            and benefits of the procedure and the sedation                            options and risks were discussed with the patient.                            All questions were answered, and informed consent                            was obtained. Prior Anticoagulants: The patient has                            taken no previous anticoagulant or antiplatelet                            agents. ASA Grade Assessment: II - A patient with                            mild systemic disease. After reviewing the risks                            and benefits, the patient was deemed in  satisfactory condition to undergo the procedure.                           After obtaining informed consent, the colonoscope                            was passed under direct vision. Throughout the                            procedure, the patient's blood pressure, pulse, and                            oxygen saturations were  monitored continuously. The                            [id number]) was introduced through the                            anus and advanced to the the cecum, identified by                            appendiceal orifice and ileocecal valve. The                            ileocecal valve, appendiceal orifice, and rectum                            were photographed. The colonoscopy was somewhat                            difficult due to the patient's agitation.                            Successful completion of the procedure was aided by                            increasing the dose of sedation medication. The                            quality of the bowel preparation was excellent. Findings:      The digital rectal exam was normal.      9 sessile polyps were found in the rectum, sigmoid colon, descending       colon, transverse colon and ascending colon. The polyps were 2 to 4 mm       in size. These polyps were removed with a cold biopsy forceps. Resection       and retrieval were complete. Estimated blood loss was minimal.      Non-bleeding internal hemorrhoids were found. The hemorrhoids were small. Impression:               - 9 2 to 4 mm polyps in the rectum, in the sigmoid                            colon, in the descending colon, in the  transverse                            colon and in the ascending colon, removed with a                            cold biopsy forceps. Resected and retrieved.                           - Non-bleeding internal hemorrhoids. Moderate Sedation:      Moderate (conscious) sedation was administered by the endoscopy nurse       and supervised by the endoscopist. The following parameters were       monitored: oxygen saturation, heart rate, blood pressure, and response       to care. Total physician intraservice time was 29 minutes. Recommendation:           - Patient has a contact number available for                            emergencies. The  signs and symptoms of potential                            delayed complications were discussed with the                            patient. Return to normal activities tomorrow.                            Written discharge instructions were provided to the                            patient.                           - High fiber diet.                           - Continue present medications.                           - Await pathology results.                           - Repeat colonoscopy in 5-10 years for surveillance. Procedure Code(s):        --- Professional ---                           [phone], Colonoscopy, flexible; with biopsy, single                            or multiple                           99153, Moderate sedation services; each additional                            15  minutes intraservice time                           G0500, Moderate sedation services provided by the                            same physician or other qualified health care                            professional performing a gastrointestinal                            endoscopic service that sedation supports,                            requiring the presence of an independent trained                            observer to assist in the monitoring of the                            patient's level of consciousness and physiological                            status; initial 15 minutes of intra-service time;                            patient age 65 years or older (additional time may                            be reported with [id number], as appropriate) Diagnosis Code(s):        --- Professional ---                           Z12.11, Encounter for screening for malignant                            neoplasm of colon                           K62.1, Rectal polyp                           D12.5, Benign neoplasm of sigmoid colon                           D12.4, Benign neoplasm of descending colon                            D12.3, Benign neoplasm of transverse colon (hepatic                            flexure or splenic flexure)                           D12.2, Benign neoplasm of  ascending colon                           K64.8, Other hemorrhoids CPT copyright 2016 American Medical Association. All rights reserved. The codes documented in this report are preliminary and upon coder review may  be revised to meet current compliance requirements. Barney Drain, MD Barney Drain, MD 04/16/2015 12:42:35 AM This report has been signed electronically. Number of Addenda: 0

## 2015-04-20 ENCOUNTER — Encounter (HOSPITAL_COMMUNITY): Payer: Self-pay | Admitting: Gastroenterology

## 2015-05-01 ENCOUNTER — Telehealth: Payer: Self-pay | Admitting: Gastroenterology

## 2015-05-01 NOTE — Telephone Encounter (Signed)
Please call pt. She had THREE simple adenomas AND SIX BENIGN POLYPS removed.  FOLLOW A HIGH FIBER DIET.  NEXT TCS IN 3 YEARS.

## 2015-05-02 NOTE — Telephone Encounter (Signed)
Reminder in epic °

## 2015-05-02 NOTE — Telephone Encounter (Signed)
Called and pt could not understand me. Mailed a letter for her to call for results.

## 2015-05-06 ENCOUNTER — Telehealth: Payer: Self-pay

## 2015-05-06 NOTE — Telephone Encounter (Signed)
Pt's daughter, [name], called to speak with DS. Patient received a letter from DS that she needed to speak with her about something important. I told the daughter that DS wasn't available at the moment. She said to call her at [phone]

## 2015-05-06 NOTE — Telephone Encounter (Signed)
Pt's daughter, [name] is aware of results. ( See result note).

## 2015-05-31 ENCOUNTER — Other Ambulatory Visit: Payer: Self-pay | Admitting: Student

## 2015-05-31 DIAGNOSIS — E785 Hyperlipidemia, unspecified: Secondary | ICD-10-CM

## 2015-06-01 LAB — LIPID PANEL
CHOL/HDL RATIO: 4 ratio (ref <=5.0)
CHOLESTEROL: 186 mg/dL (ref 125–200)
HDL: 46 mg/dL (ref >=46)
LDL Cholesterol: 116 mg/dL (ref <130)
TRIGLYCERIDES: 122 mg/dL (ref <150)
VLDL: 24 mg/dL (ref <30)

## 2015-06-02 LAB — COMPLETE METABOLIC PANEL WITH GFR
ALBUMIN: 4.4 g/dL (ref 3.6–5.1)
ALK PHOS: 61 U/L (ref 33–130)
ALT: 13 U/L (ref 6–29)
AST: 17 U/L (ref 10–35)
BILIRUBIN TOTAL: 0.9 mg/dL (ref 0.2–1.2)
BUN: 15 mg/dL (ref 7–25)
CO2: 24 mmol/L (ref 20–31)
CREATININE: 0.76 mg/dL (ref 0.50–0.99)
Calcium: 9.2 mg/dL (ref 8.6–10.4)
Chloride: 104 mmol/L (ref 98–110)
GFR, Est African American: >89 mL/min (ref >=60)
GFR, Est Non African American: 83 mL/min (ref >=60)
GLUCOSE: 96 mg/dL (ref 65–99)
Potassium: 4.9 mmol/L (ref 3.5–5.3)
SODIUM: 139 mmol/L (ref 135–146)
TOTAL PROTEIN: 7.5 g/dL (ref 6.1–8.1)

## 2015-06-08 ENCOUNTER — Ambulatory Visit: Payer: Self-pay | Admitting: Physician Assistant

## 2015-06-08 ENCOUNTER — Encounter: Payer: Self-pay | Admitting: Physician Assistant

## 2015-06-08 VITALS — BP 130/74 | HR 70 | Temp 97.7°F | Ht 66.0 in | Wt 168.0 lb

## 2015-06-08 DIAGNOSIS — K219 Gastro-esophageal reflux disease without esophagitis: Secondary | ICD-10-CM

## 2015-06-08 DIAGNOSIS — E785 Hyperlipidemia, unspecified: Secondary | ICD-10-CM

## 2015-06-08 DIAGNOSIS — G250 Essential tremor: Secondary | ICD-10-CM

## 2015-06-08 DIAGNOSIS — Z1239 Encounter for other screening for malignant neoplasm of breast: Secondary | ICD-10-CM

## 2015-06-08 NOTE — Progress Notes (Signed)
BP 130/74 mmHg  Pulse 70  Temp(Src) 97.7 F (36.5 C)  Ht 5\' 6"  (1.676 m)  Wt 168 lb (76.204 kg)  BMI 27.13 kg/m2  SpO2 98%   Subjective:    Patient ID: Lisa Mcdowell, female    DOB: [birth year], 65 y.o.   MRN: [id number]  HPI: Lisa Mcdowell is a 65 y.o. female presenting on 06/08/2015 for Hyperlipidemia   HPI  Pt had colonoscopy since last OV.  Pt says she does not know results of her colonoscopy so I reviewed with her the polyps and adenomas and need for repeat in 3 years.  She says her daughter didn't share that with her.  Told pt that she needs to tell her daughter to share this information with her when she gets it  Pt states she is doing well today  Relevant past medical, surgical, family and social history reviewed and updated as indicated. Interim medical history since our last visit reviewed. Allergies and medications reviewed and updated.  Current outpatient prescriptions:  .  lovastatin (MEVACOR) 10 MG tablet, Take 1 tablet (10 mg total) by mouth at bedtime. tomar una tableta a la hora de [place], Disp: 30 tablet, Rfl: 6 .  pantoprazole (PROTONIX) 40 MG tablet, Take 1 tablet (40 mg total) by mouth 2 (two) times daily. Tome una tableta por [employer] veces diarias (Patient taking differently: Take 40-80 mg by mouth daily. Tome una tableta por boca dos veces diarias), Disp: 60 tablet, Rfl: 3 .  phenylephrine-shark liver oil-mineral oil-petrolatum (PREPARATION H) 0.25-3-14-71.9 % rectal ointment, Place 1 application rectally daily. Reported on 03/24/2015, Disp: , Rfl:    Review of Systems  Constitutional: Negative for fever, chills, diaphoresis, appetite change, fatigue and unexpected weight change.  HENT: Negative for congestion, dental problem, drooling, ear pain, facial swelling, hearing loss, mouth sores, sneezing, sore throat, trouble swallowing and voice change.   Eyes: Negative for pain, discharge, redness, itching and visual disturbance.  Respiratory: Negative  for cough, choking, shortness of breath and wheezing.   Cardiovascular: Negative for chest pain, palpitations and leg swelling.  Gastrointestinal: Negative for vomiting, abdominal pain, diarrhea, constipation and blood in stool.  Endocrine: Negative for cold intolerance, heat intolerance and polydipsia.  Genitourinary: Negative for dysuria, hematuria and decreased urine volume.  Musculoskeletal: Negative for back pain, arthralgias and gait problem.  Skin: Negative for rash.  Allergic/Immunologic: Negative for environmental allergies.  Neurological: Negative for seizures, syncope, light-headedness and headaches.  Hematological: Negative for adenopathy.  Psychiatric/Behavioral: Negative for suicidal ideas, dysphoric mood and agitation. The patient is not nervous/anxious.     Per HPI unless specifically indicated above     Objective:    BP 130/74 mmHg  Pulse 70  Temp(Src) 97.7 F (36.5 C)  Ht 5\' 6"  (1.676 m)  Wt 168 lb (76.204 kg)  BMI 27.13 kg/m2  SpO2 98%  Wt Readings from Last 3 Encounters:  06/08/15 168 lb (76.204 kg)  04/11/15 163 lb 4.8 oz (74.072 kg)  03/24/15 163 lb 12.8 oz (74.299 kg)    Physical Exam  Constitutional: She is oriented to person, place, and time. She appears well-developed and well-nourished.  HENT:  Head: Normocephalic and atraumatic.  Neck: Neck supple.  Cardiovascular: Normal rate and regular rhythm.   Pulmonary/Chest: Effort normal and breath sounds normal.  Abdominal: Soft. Bowel sounds are normal. She exhibits no mass. There is no hepatosplenomegaly. There is no tenderness. There is no rigidity and no guarding. A hernia (umbilical) is present.  Musculoskeletal:  She exhibits no edema.  Lymphadenopathy:    She has no cervical adenopathy.  Neurological: She is alert and oriented to person, place, and time. She displays tremor.  Skin: Skin is warm and dry.  Psychiatric: She has a normal mood and affect. Her behavior is normal.  Vitals reviewed.     Results for orders placed or performed in visit on 05/31/15  COMPLETE METABOLIC PANEL WITH GFR  Result Value Ref Range   Sodium 139 135 - 146 mmol/L   Potassium 4.9 3.5 - 5.3 mmol/L   Chloride 104 98 - 110 mmol/L   CO2 24 20 - 31 mmol/L   Glucose, Bld 96 65 - 99 mg/dL   BUN 15 7 - 25 mg/dL   Creat 0.76 0.50 - 0.99 mg/dL   Total Bilirubin 0.9 0.2 - 1.2 mg/dL   Alkaline Phosphatase 61 33 - 130 U/L   AST 17 10 - 35 U/L   ALT 13 6 - 29 U/L   Total Protein 7.5 6.1 - 8.1 g/dL   Albumin 4.4 3.6 - 5.1 g/dL   Calcium 9.2 8.6 - 10.4 mg/dL   GFR, Est African American >89 >=60 mL/min   GFR, Est Non African American 83 >=60 mL/min  Lipid Profile  Result Value Ref Range   Cholesterol 186 125 - 200 mg/dL   Triglycerides 122 <150 mg/dL   HDL 46 >=46 mg/dL   Total CHOL/HDL Ratio 4.0 <=5.0 Ratio   VLDL 24 <30 mg/dL   LDL Cholesterol 116 <130 mg/dL      Assessment & Plan:   Encounter Diagnoses  Name Primary?  . Hyperlipidemia Yes  . Gastroesophageal reflux disease, esophagitis presence not specified   . Essential tremor   . Screening for breast cancer     -reviewed labs with pt  -order screening mammogram -Continue current medications -discussed with pt that she will need to get new pcp as of June with her upcoming birthday.  She states understanding.  She can RTO prior to that if she has any issues

## 2015-06-16 ENCOUNTER — Other Ambulatory Visit: Payer: Self-pay | Admitting: Physician Assistant

## 2015-06-16 ENCOUNTER — Ambulatory Visit (HOSPITAL_COMMUNITY)
Admission: RE | Admit: 2015-06-16 | Discharge: 2015-06-16 | Disposition: A | Discharge status: Home / Self Care | Payer: Self-pay | Source: Ambulatory Visit | Attending: Physician Assistant | Admitting: Physician Assistant

## 2015-06-16 ENCOUNTER — Ambulatory Visit (HOSPITAL_COMMUNITY): Admission: RE | Admit: 2015-06-16 | Payer: Self-pay | Source: Ambulatory Visit

## 2015-06-16 DIAGNOSIS — Z1231 Encounter for screening mammogram for malignant neoplasm of breast: Secondary | ICD-10-CM

## 2015-06-28 ENCOUNTER — Other Ambulatory Visit: Payer: Self-pay | Admitting: Physician Assistant

## 2015-06-28 DIAGNOSIS — R928 Other abnormal and inconclusive findings on diagnostic imaging of breast: Secondary | ICD-10-CM

## 2015-07-25 ENCOUNTER — Other Ambulatory Visit (HOSPITAL_COMMUNITY): Payer: Self-pay | Admitting: *Deleted

## 2015-07-25 DIAGNOSIS — R928 Other abnormal and inconclusive findings on diagnostic imaging of breast: Secondary | ICD-10-CM

## 2015-07-26 ENCOUNTER — Encounter (HOSPITAL_COMMUNITY): Payer: Self-pay

## 2015-08-02 ENCOUNTER — Ambulatory Visit (HOSPITAL_COMMUNITY)
Admission: RE | Admit: 2015-08-02 | Discharge: 2015-08-02 | Disposition: A | Discharge status: Home / Self Care | Payer: PRIVATE HEALTH INSURANCE | Source: Ambulatory Visit | Attending: *Deleted | Admitting: *Deleted

## 2015-08-02 ENCOUNTER — Encounter (HOSPITAL_COMMUNITY): Payer: Self-pay

## 2015-08-02 ENCOUNTER — Other Ambulatory Visit (HOSPITAL_COMMUNITY): Payer: Self-pay | Admitting: *Deleted

## 2015-08-02 DIAGNOSIS — N63 Unspecified lump in breast: Secondary | ICD-10-CM | POA: Diagnosis not present

## 2015-08-02 DIAGNOSIS — R928 Other abnormal and inconclusive findings on diagnostic imaging of breast: Secondary | ICD-10-CM

## 2015-08-02 DIAGNOSIS — R590 Localized enlarged lymph nodes: Secondary | ICD-10-CM | POA: Diagnosis present

## 2017-02-14 ENCOUNTER — Other Ambulatory Visit: Payer: Self-pay | Admitting: *Deleted

## 2017-02-14 DIAGNOSIS — M898X1 Other specified disorders of bone, shoulder: Secondary | ICD-10-CM

## 2017-02-15 ENCOUNTER — Ambulatory Visit (HOSPITAL_COMMUNITY)
Admission: RE | Admit: 2017-02-15 | Discharge: 2017-02-15 | Disposition: A | Discharge status: Home / Self Care | Payer: Self-pay | Source: Ambulatory Visit | Attending: *Deleted | Admitting: *Deleted

## 2017-02-15 DIAGNOSIS — M898X1 Other specified disorders of bone, shoulder: Secondary | ICD-10-CM | POA: Insufficient documentation

## 2017-02-15 DIAGNOSIS — K449 Diaphragmatic hernia without obstruction or gangrene: Secondary | ICD-10-CM | POA: Insufficient documentation

## 2017-12-12 ENCOUNTER — Emergency Department (HOSPITAL_COMMUNITY)
Admission: EM | Admit: 2017-12-12 | Discharge: 2017-12-12 | Disposition: A | Discharge status: Home / Self Care | Payer: Self-pay | Attending: Emergency Medicine | Admitting: Emergency Medicine

## 2017-12-12 ENCOUNTER — Emergency Department (HOSPITAL_COMMUNITY): Payer: Self-pay

## 2017-12-12 DIAGNOSIS — K420 Umbilical hernia with obstruction, without gangrene: Secondary | ICD-10-CM | POA: Insufficient documentation

## 2017-12-12 LAB — COMPREHENSIVE METABOLIC PANEL
ALBUMIN: 3.9 g/dL (ref 3.5–5.0)
ALK PHOS: 57 U/L (ref 38–126)
ALT: 21 U/L (ref 0–44)
AST: 26 U/L (ref 15–41)
Anion gap: 9 (ref 5–15)
BILIRUBIN TOTAL: 0.8 mg/dL (ref 0.3–1.2)
BUN: 14 mg/dL (ref 8–23)
CALCIUM: 9 mg/dL (ref 8.9–10.3)
CO2: 22 mmol/L (ref 22–32)
CREATININE: 0.72 mg/dL (ref 0.44–1.00)
Chloride: 106 mmol/L (ref 98–111)
GFR calc Af Amer: >60 mL/min (ref >60)
GFR calc non Af Amer: >60 mL/min (ref >60)
GLUCOSE: 104 mg/dL — AB (ref 70–99)
Potassium: 3.8 mmol/L (ref 3.5–5.1)
SODIUM: 137 mmol/L (ref 135–145)
TOTAL PROTEIN: 7.5 g/dL (ref 6.5–8.1)

## 2017-12-12 LAB — CBC WITH DIFFERENTIAL/PLATELET
ABS IMMATURE GRANULOCYTES: 0 10*3/uL (ref 0.00–0.07)
BASOS ABS: 0 10*3/uL (ref 0.0–0.1)
BASOS PCT: 1 %
Eosinophils Absolute: 0.1 10*3/uL (ref 0.0–0.5)
Eosinophils Relative: 2 %
HCT: 39.6 % (ref 36.0–46.0)
HEMOGLOBIN: 12.9 g/dL (ref 12.0–15.0)
IMMATURE GRANULOCYTES: 0 %
LYMPHS ABS: 1.7 10*3/uL (ref 0.7–4.0)
Lymphocytes Relative: 39 %
MCH: 32.3 pg (ref 26.0–34.0)
MCHC: 32.6 g/dL (ref 30.0–36.0)
MCV: 99.2 fL (ref 80.0–100.0)
MONOS PCT: 9 %
Monocytes Absolute: 0.4 10*3/uL (ref 0.1–1.0)
Neutro Abs: 2.2 10*3/uL (ref 1.7–7.7)
Neutrophils Relative %: 49 %
PLATELETS: 200 10*3/uL (ref 150–400)
RBC: 3.99 MIL/uL (ref 3.87–5.11)
RDW: 12.6 % (ref 11.5–15.5)
WBC: 4.4 10*3/uL (ref 4.0–10.5)
nRBC: 0 % (ref 0.0–0.2)

## 2017-12-12 LAB — LIPASE, BLOOD: Lipase: 26 U/L (ref 11–51)

## 2017-12-12 NOTE — ED Provider Notes (Addendum)
[facility] EMERGENCY DEPARTMENT Provider Note   CSN: [id number] Arrival date & time: 12/12/17  1110     History   Chief Complaint Chief Complaint  Patient presents with  . Abdominal Pain    HPI THY GULLIKSON is a 67 y.o. female.  HPI  5 caveat secondary to language barrier 67 year old female presents today complaining of abdominal pain that began 2 days ago.  She was told she had a number of umbilical hernia 2 months ago.  2 days ago when she began having pain she noted that there was swelling around the umbilicus.  She has not had nausea, vomiting, or diarrhea.  The pain is worse when she is upright and with straining.  She denies any fever, chills, chest pain, or dyspnea.  Past Medical History:  Diagnosis Date  . Cholelithiasis   . Gallstones 08/08/2010  . GERD (gastroesophageal reflux disease)   . Hyperlipidemia   . Nausea & vomiting     Patient Active Problem List   Diagnosis Date Noted  . Encounter for screening colonoscopy 03/24/2015  . Essential tremor 02/06/2015  . Infectious colitis 12/16/2014  . Hyperlipidemia 10/28/2014  . GERD (gastroesophageal reflux disease) 10/28/2014  . Hemorrhoids 10/28/2014  . Gallstones 08/08/2010    Past Surgical History:  Procedure Laterality Date  . CHOLECYSTECTOMY  08/08/2010   Procedure: LAPAROSCOPIC CHOLECYSTECTOMY;  Surgeon: Scherry Ran;  Location: [facility];  Service: General;  Laterality: N/A;  . COLONOSCOPY N/A 04/15/2015   Procedure: COLONOSCOPY;  Surgeon: Danie Binder, MD;  Location: [facility] SUITE;  Service: Endoscopy;  Laterality: N/A;  1015 - moved to 10:00 - office to notify - interpreter scheduled, do NOT change time  . VARICOSE VEIN SURGERY  2011   [facility]     OB History   None      Home Medications    Prior to Admission medications   Medication Sig Start Date End Date Taking? Authorizing Provider  ezetimibe (ZETIA) 10 MG tablet Take 10 mg by mouth daily.   Yes [provider]  lovastatin (MEVACOR) 10 MG tablet Take 1 tablet (10 mg total) by mouth at bedtime. tomar una tableta a la hora de [place] Patient not taking: Reported on 12/12/2017 10/28/14   Soyla Dryer, PA-C  pantoprazole (PROTONIX) 40 MG tablet Take 1 tablet (40 mg total) by mouth 2 (two) times daily. Tome una tableta por boca dos veces diarias Patient not taking: Reported on 12/12/2017 01/27/15   Soyla Dryer, PA-C    Family History Family History  Problem Relation Age of Onset  . Colon cancer Neg Hx     Social History Social History   Tobacco Use  . Smoking status: Never Smoker  . Smokeless tobacco: Never Used  Substance Use Topics  . Alcohol use: No  . Drug use: No     Allergies   Patient has no known allergies.   Review of Systems Review of Systems  All other systems reviewed and are negative.    Physical Exam Updated Vital Signs BP 125/68   Pulse 60   Ht 1.702 m (5\' 7" )   Wt 72.6 kg   SpO2 97%   BMI 25.06 kg/m   Physical Exam  Constitutional: She is oriented to person, place, and time. She appears well-developed and well-nourished.  HENT:  Head: Normocephalic and atraumatic.  Mouth/Throat: Oropharynx is clear and moist.  Eyes: Pupils are equal, round, and reactive to light. EOM are normal.  Cardiovascular: Normal rate,  regular rhythm and normal heart sounds.  Pulmonary/Chest: Effort normal.  Abdominal: Bowel sounds are normal. There is tenderness in the periumbilical area.  With umbilical hernia with some surrounding erythema and tenderness to palpation  Neurological: She is alert and oriented to person, place, and time.  Skin: Skin is warm and dry.  Psychiatric: She has a normal mood and affect.  Nursing note and vitals reviewed.    ED Treatments / Results  Labs (all labs ordered are listed, but only abnormal results are displayed) Labs Reviewed  CBC WITH DIFFERENTIAL/PLATELET  COMPREHENSIVE METABOLIC PANEL  LIPASE, BLOOD    URINALYSIS, ROUTINE W REFLEX MICROSCOPIC    EKG None  Radiology Dg Abdomen 1 View  Result Date: 12/12/2017 CLINICAL DATA:  Umbilical hernia reduction EXAM: ABDOMEN - 1 VIEW COMPARISON:  Portable exam 1140 hours compared to CT abdomen and pelvis 12/16/2014 FINDINGS: Scattered gas and stool throughout distal colon. Gas present in the proximal half of the colon. No bowel dilatation or bowel wall thickening. Osseous structures demineralized but otherwise unremarkable. Surgical clips RIGHT upper quadrant from cholecystectomy. No urinary tract calcification. IMPRESSION: Nonobstructive bowel gas pattern. Electronically Signed   By: Lavonia Dana M.D.   On: 12/12/2017 11:55    Procedures Procedures (including critical care time)  Medications Ordered in ED Medications - No data to display   Initial Impression / Assessment and Plan / ED Course  I have reviewed the triage vital signs and the nursing notes.  Pertinent labs & imaging results that were available during my care of the patient were reviewed by me and considered in my medical decision making (see chart for details).    Umbilical hernia reduced here with manual manipulation.  Patient with decreased pain.  Post reduction x-Shrey Boike without signs of obstruction.  Post reduction exam without tenderness and bowel sounds are normal Patient advised regarding return precautions and need for follow-up.  Referral to general surgery given. Final Clinical Impressions(s) / ED Diagnoses   Final diagnoses:  Umbilical hernia with obstruction, without gangrene    ED Discharge Orders    None       Pattricia Boss, MD 12/12/17 1231    Pattricia Boss, MD 12/12/17 1232

## 2017-12-12 NOTE — Discharge Instructions (Signed)
Please call [place] surgery tomorrow for appointment as soon as possible Avoid increasing abdominal pressure by avoiding lifting heavy objects or straining. If you have swelling again around your umbilicus (bellybutton) laid down flat and apply firm pressure.  Return to the emergency department if you are having worsening pain that you are unable to resolve

## 2017-12-12 NOTE — ED Notes (Signed)
Pt to Xray.

## 2017-12-12 NOTE — ED Triage Notes (Signed)
Pt reports pain for a hernia in her belly button, reports was diagnosed with hernia two months ago but it started hurting two days ago, no nausea or vomitting

## 2017-12-18 ENCOUNTER — Ambulatory Visit: Payer: Self-pay | Admitting: Surgery

## 2017-12-18 NOTE — H&P (Signed)
History of Present Illness Lisa Mcdowell. Lisa Treadway MD; 12/18/2017 6:06 PM) The patient is a 67 year old female who presents with an umbilical hernia. Referred by Dr. Pattricia Mcdowell for umbilical hernia  This is a 67 year old Spanish-speaking female who is status post laparoscopic cholecystectomy by Dr. Romona [facility]. In 2016, the patient presented to the emergency department at that hospital with abdominal pain. She underwent a CT scan on 12/16/14 that showed a small hiatal hernia, small umbilical hernia containing fat, and mild thickening of the transverse colon and hepatic flexure. Apparently the patient does not remember being told about her umbilical hernia. She underwent a colonoscopy by Dr. Oneida Mcdowell in 2017 that showed several small polyps scattered throughout her colon. None of these were malignant. Umbilical hernia has become larger. She was recently seen in the emergency Department for severe pain at the umbilicus. Dr. Jeanell Mcdowell was able to reduce the umbilical hernia. She is now referred to [place] for surgical evaluation.  CLINICAL DATA: Nausea, diarrhea, fever this morning, upper abdominal pain for 4 months  EXAM: CT ABDOMEN AND PELVIS WITH CONTRAST  TECHNIQUE: Multidetector CT imaging of the abdomen and pelvis was performed using the standard protocol following bolus administration of intravenous contrast.  CONTRAST: 157mL OMNIPAQUE IOHEXOL 300 MG/ML SOLN  COMPARISON: None.  FINDINGS: Small hiatal hernia is noted. There is contrast material in distal esophagus suspicious for gastroesophageal reflux disease. Mild fatty infiltration of the liver. The patient is status postcholecystectomy.  The lung bases are unremarkable.  The pancreas, spleen and adrenal glands are unremarkable. Kidneys are symmetrical in size and enhancement. No hydronephrosis or hydroureter.  There is umbilical hernia containing fat measures 3.5 cm without evidence of acute  complication.  No small bowel obstruction. No ascites or free air. No adenopathy. The uterus is atrophic. Ovaries are unremarkable.  Normal appendix. No pericecal inflammation. The terminal ileum is unremarkable. There is mild thickening of the wall of the distal transverse colon and splenic flexure of the colon. Colitis cannot be excluded. The colon is empty.  Mild thickening of colonic wall in descending colon. Sigmoid colon is unremarkable. There is no distal colonic obstruction. Urinary bladder is unremarkable. Small nonspecific bilateral inguinal lymph nodes are noted. No destructive bony lesions are noted within pelvis.  Sagittal images of the spine are unremarkable.  Delayed renal images shows bilateral renal symmetrical excretion.  IMPRESSION: 1. Small hiatal hernia. There is contrast material within distal esophagus suspicious for gastroesophageal reflux.  2. There is mild thickening of colonic wall in transverse colon and hepatic flexure of the colon. Mild thickening of colonic wall in descending colon. Colitis cannot be excluded. Clinical correlation is necessary. 3. No mesenteric fluid collection. No small bowel obstruction. Normal appendix. No pericecal inflammation. 4. Small umbilical hernia containing fat without evidence of acute complication. 5. No hydronephrosis or hydroureter. Unremarkable uterus and adnexa.   Electronically Signed By: Lisa Mcdowell M.D. On: 12/16/2014 13:00   Past Surgical History (Tanisha A. Owens Shark, [place]; 12/18/2017 4:14 PM) Gallbladder Surgery - Open  Diagnostic Studies History (Tanisha A. Owens Shark, [place]; 12/18/2017 4:14 PM) Colonoscopy never Mammogram 1-3 years ago  Allergies (Tanisha A. Owens Shark, [place]; 12/18/2017 4:15 PM) No Known Drug Allergies [12/18/2017]:  Medication History (Tanisha A. Owens Shark, [place]; 12/18/2017 4:15 PM) Chauncey Cruel (10MG  Tablet, Oral) Active. Medications Reconciled  Social History (Tanisha A. Owens Shark, [place]; 12/18/2017 4:14  PM) No alcohol use No caffeine use No drug use Tobacco use Never smoker.  Family History (Tanisha A. Owens Shark, [place];  12/18/2017 4:14 PM) Diabetes Mellitus Daughter.  Pregnancy / Birth History (Tanisha A. Owens Shark, [place]; 12/18/2017 4:14 PM) Age at menarche 74 years. Age of menopause 40-55 Gravida 3 Maternal age 29-20 Para 3     Review of Systems (Tanisha A. Brown RMA; 12/18/2017 4:14 PM) General Not Present- Appetite Loss, Chills, Fatigue, Fever, Night Sweats, Weight Gain and Weight Loss. Skin Not Present- Change in Wart/Mole, Dryness, Hives, Jaundice, New Lesions, Non-Healing Wounds, Rash and Ulcer. HEENT Not Present- Earache, Hearing Loss, Hoarseness, Nose Bleed, Oral Ulcers, Ringing in the Ears, Seasonal Allergies, Sinus Pain, Sore Throat, Visual Disturbances, Wears glasses/contact lenses and Yellow Eyes. Respiratory Not Present- Bloody sputum, Chronic Cough, Difficulty Breathing, Snoring and Wheezing. Breast Not Present- Breast Mass, Breast Pain, Nipple Discharge and Skin Changes. Cardiovascular Not Present- Chest Pain, Difficulty Breathing Lying Down, Leg Cramps, Palpitations, Rapid Heart Rate, Shortness of Breath and Swelling of Extremities. Gastrointestinal Present- Abdominal Pain. Not Present- Bloating, Bloody Stool, Change in Bowel Habits, Chronic diarrhea, Constipation, Difficulty Swallowing, Excessive gas, Gets full quickly at meals, Hemorrhoids, Indigestion, Nausea, Rectal Pain and Vomiting. Female Genitourinary Not Present- Frequency, Nocturia, Painful Urination, Pelvic Pain and Urgency. Neurological Not Present- Decreased Memory, Fainting, Headaches, Numbness, Seizures, Tingling, Tremor, Trouble walking and Weakness. Psychiatric Not Present- Anxiety, Bipolar, Change in Sleep Pattern, Depression, Fearful and Frequent crying. Endocrine Not Present- Cold Intolerance, Excessive Hunger, Hair Changes, Heat Intolerance, Hot flashes and New Diabetes. Hematology Not Present- Blood  Thinners, Easy Bruising, Excessive bleeding, Gland problems, HIV and Persistent Infections.  Vitals (Tanisha A. Brown RMA; 12/18/2017 4:15 PM) 12/18/2017 4:15 PM Weight: 167.4 lb Height: 68in Body Surface Area: 1.9 m Body Mass Index: 25.45 kg/m  Temp.: 98.21F  Pulse: 88 (Regular)  P.OX: 99% (Room air) BP: 134/84 (Sitting, Left Arm, Standard)      Physical Exam Rodman Key K. Raliegh Scobie MD; 12/18/2017 6:07 PM)  The physical exam findings are as follows: Note:WDWN in NAD Eyes: Pupils equal, round; sclera anicteric HENT: Oral mucosa moist; good dentition Neck: No masses palpated, no thyromegaly Lungs: CTA bilaterally; normal respiratory effort CV: Regular rate and rhythm; no murmurs; extremities well-perfused with no edema Abd: +bowel sounds, soft, non-tender, no palpable organomegaly; protruding umbilical hernia with approximately 4 cm diameter hernia sac The hernia is reducible with a defect of about 2 cm. Skin: Warm, dry; no sign of jaundice Psychiatric - alert and oriented x 4; calm mood and affect    Assessment & Plan Rodman Key K. Adana Marik MD; 91/06/3844 6:59 PM)  UMBILICAL HERNIA WITHOUT OBSTRUCTION OR GANGRENE (K42.9)  Current Plans Schedule for Surgery - Umbilical hernia repair with mesh. The surgical procedure has been discussed with the patient. Potential risks, benefits, alternative treatments, and expected outcomes have been explained. All of the patient's questions at this time have been answered. The likelihood of reaching the patient's treatment goal is good. The patient understand the proposed surgical procedure and wishes to proceed.  Lisa Mcdowell. Georgette Dover, MD, [facility]  General/ Trauma Surgery Beeper [phone]  12/18/2017 6:07 PM

## 2018-01-16 ENCOUNTER — Encounter (HOSPITAL_BASED_OUTPATIENT_CLINIC_OR_DEPARTMENT_OTHER): Payer: Self-pay | Admitting: *Deleted

## 2018-01-16 ENCOUNTER — Other Ambulatory Visit: Payer: Self-pay

## 2018-01-17 NOTE — Progress Notes (Signed)
Ensure pre surgery drink given with instructions to complete by 0800 dos, surgical soap given with instructions, pt verbalized understanding. 

## 2018-01-23 ENCOUNTER — Ambulatory Visit (HOSPITAL_BASED_OUTPATIENT_CLINIC_OR_DEPARTMENT_OTHER)
Admission: RE | Admit: 2018-01-23 | Discharge: 2018-01-23 | Disposition: A | Discharge status: Home / Self Care | Payer: Self-pay | Attending: Surgery | Admitting: Surgery

## 2018-01-23 ENCOUNTER — Encounter (HOSPITAL_BASED_OUTPATIENT_CLINIC_OR_DEPARTMENT_OTHER)
Admission: RE | Disposition: A | Discharge status: Home / Self Care | Payer: Self-pay | Source: Home / Self Care | Attending: Surgery

## 2018-01-23 ENCOUNTER — Ambulatory Visit (HOSPITAL_BASED_OUTPATIENT_CLINIC_OR_DEPARTMENT_OTHER): Payer: Self-pay | Admitting: Anesthesiology

## 2018-01-23 ENCOUNTER — Encounter (HOSPITAL_BASED_OUTPATIENT_CLINIC_OR_DEPARTMENT_OTHER): Payer: Self-pay | Admitting: *Deleted

## 2018-01-23 ENCOUNTER — Other Ambulatory Visit: Payer: Self-pay

## 2018-01-23 DIAGNOSIS — Z9049 Acquired absence of other specified parts of digestive tract: Secondary | ICD-10-CM | POA: Insufficient documentation

## 2018-01-23 DIAGNOSIS — K429 Umbilical hernia without obstruction or gangrene: Secondary | ICD-10-CM | POA: Insufficient documentation

## 2018-01-23 DIAGNOSIS — Z833 Family history of diabetes mellitus: Secondary | ICD-10-CM | POA: Insufficient documentation

## 2018-01-23 DIAGNOSIS — Z86010 Personal history of colonic polyps: Secondary | ICD-10-CM | POA: Insufficient documentation

## 2018-01-23 DIAGNOSIS — K449 Diaphragmatic hernia without obstruction or gangrene: Secondary | ICD-10-CM | POA: Insufficient documentation

## 2018-01-23 DIAGNOSIS — K219 Gastro-esophageal reflux disease without esophagitis: Secondary | ICD-10-CM | POA: Insufficient documentation

## 2018-01-23 HISTORY — PX: UMBILICAL HERNIA REPAIR: SHX196

## 2018-01-23 HISTORY — PX: INSERTION OF MESH: SHX5868

## 2018-01-23 SURGERY — REPAIR, HERNIA, UMBILICAL, ADULT
Anesthesia: Regional | Site: Abdomen

## 2018-01-23 MED ORDER — FENTANYL CITRATE (PF) 100 MCG/2ML IJ SOLN
INTRAMUSCULAR | Status: AC
  Filled 2018-01-23: qty 2

## 2018-01-23 MED ORDER — LACTATED RINGERS IV SOLN
INTRAVENOUS | Status: DC
  Administered 2018-01-23: 11:00:00 via INTRAVENOUS

## 2018-01-23 MED ORDER — BUPIVACAINE-EPINEPHRINE 0.25% -1:200000 IJ SOLN
INTRAMUSCULAR | Status: DC | PRN
  Administered 2018-01-23: 10 mL

## 2018-01-23 MED ORDER — SCOPOLAMINE 1 MG/3DAYS TD PT72: 1.0000 | MEDICATED_PATCH | Freq: Once | TRANSDERMAL | Status: DC | PRN

## 2018-01-23 MED ORDER — CEFAZOLIN SODIUM-DEXTROSE 2-4 GM/100ML-% IV SOLN
2.0000 g | INTRAVENOUS | Status: AC
  Administered 2018-01-23: 2 g via INTRAVENOUS

## 2018-01-23 MED ORDER — DEXAMETHASONE SODIUM PHOSPHATE 10 MG/ML IJ SOLN
INTRAMUSCULAR | Status: AC
  Filled 2018-01-23: qty 1

## 2018-01-23 MED ORDER — FENTANYL CITRATE (PF) 100 MCG/2ML IJ SOLN
50.0000 ug | INTRAMUSCULAR | Status: AC | PRN
  Administered 2018-01-23 (×2): 25 ug via INTRAVENOUS
  Administered 2018-01-23: 50 ug via INTRAVENOUS

## 2018-01-23 MED ORDER — MIDAZOLAM HCL 2 MG/2ML IJ SOLN
1.0000 mg | INTRAMUSCULAR | Status: DC | PRN
  Administered 2018-01-23 (×2): 1 mg via INTRAVENOUS

## 2018-01-23 MED ORDER — GABAPENTIN 300 MG PO CAPS
300.0000 mg | ORAL_CAPSULE | ORAL | Status: AC
  Administered 2018-01-23: 300 mg via ORAL

## 2018-01-23 MED ORDER — ONDANSETRON HCL 4 MG/2ML IJ SOLN
INTRAMUSCULAR | Status: AC
  Filled 2018-01-23: qty 2

## 2018-01-23 MED ORDER — CEFAZOLIN SODIUM-DEXTROSE 2-4 GM/100ML-% IV SOLN
INTRAVENOUS | Status: AC
  Filled 2018-01-23: qty 100

## 2018-01-23 MED ORDER — FENTANYL CITRATE (PF) 100 MCG/2ML IJ SOLN
25.0000 ug | INTRAMUSCULAR | Status: DC | PRN
  Administered 2018-01-23: 25 ug via INTRAVENOUS

## 2018-01-23 MED ORDER — ONDANSETRON HCL 4 MG/2ML IJ SOLN: 4.0000 mg | Freq: Once | INTRAMUSCULAR | Status: DC | PRN

## 2018-01-23 MED ORDER — LIDOCAINE 2% (20 MG/ML) 5 ML SYRINGE
INTRAMUSCULAR | Status: DC | PRN
  Administered 2018-01-23: 80 mg via INTRAVENOUS

## 2018-01-23 MED ORDER — CHLORHEXIDINE GLUCONATE CLOTH 2 % EX PADS: 6.0000 | MEDICATED_PAD | Freq: Once | CUTANEOUS | Status: DC

## 2018-01-23 MED ORDER — ACETAMINOPHEN 500 MG PO TABS
ORAL_TABLET | ORAL | Status: AC
  Filled 2018-01-23: qty 2

## 2018-01-23 MED ORDER — LIDOCAINE 2% (20 MG/ML) 5 ML SYRINGE
INTRAMUSCULAR | Status: AC
  Filled 2018-01-23: qty 5

## 2018-01-23 MED ORDER — ONDANSETRON HCL 4 MG/2ML IJ SOLN
INTRAMUSCULAR | Status: DC | PRN
  Administered 2018-01-23: 4 mg via INTRAVENOUS

## 2018-01-23 MED ORDER — OXYCODONE HCL 5 MG PO TABS: 5.0000 mg | ORAL_TABLET | Freq: Four times a day (QID) | ORAL | 0 refills | Status: DC | PRN

## 2018-01-23 MED ORDER — GABAPENTIN 300 MG PO CAPS
ORAL_CAPSULE | ORAL | Status: AC
  Filled 2018-01-23: qty 1

## 2018-01-23 MED ORDER — PROPOFOL 10 MG/ML IV BOLUS
INTRAVENOUS | Status: DC | PRN
  Administered 2018-01-23: 130 mg via INTRAVENOUS

## 2018-01-23 MED ORDER — MIDAZOLAM HCL 2 MG/2ML IJ SOLN
INTRAMUSCULAR | Status: AC
  Filled 2018-01-23: qty 2

## 2018-01-23 MED ORDER — DEXAMETHASONE SODIUM PHOSPHATE 4 MG/ML IJ SOLN
INTRAMUSCULAR | Status: DC | PRN
  Administered 2018-01-23: 10 mg via INTRAVENOUS

## 2018-01-23 MED ORDER — ACETAMINOPHEN 500 MG PO TABS
1000.0000 mg | ORAL_TABLET | ORAL | Status: AC
  Administered 2018-01-23: 1000 mg via ORAL

## 2018-01-23 SURGICAL SUPPLY — 64 items
APPLICATOR COTTON TIP 6 STRL (MISCELLANEOUS) IMPLANT
APPLICATOR COTTON TIP 6IN STRL (MISCELLANEOUS)
BENZOIN TINCTURE PRP APPL 2/3 (GAUZE/BANDAGES/DRESSINGS) ×3 IMPLANT
BLADE CLIPPER SURG (BLADE) ×3 IMPLANT
BLADE HEX COATED 2.75 (ELECTRODE) ×3 IMPLANT
BLADE SURG 15 STRL LF DISP TIS (BLADE) ×1 IMPLANT
BLADE SURG 15 STRL SS (BLADE) ×2
CANISTER SUCT 1200ML W/VALVE (MISCELLANEOUS) IMPLANT
CHLORAPREP W/TINT 26ML (MISCELLANEOUS) ×3 IMPLANT
CLOSURE WOUND 1/2 X4 (GAUZE/BANDAGES/DRESSINGS) ×1
COVER BACK TABLE 60X90IN (DRAPES) ×3 IMPLANT
COVER MAYO STAND STRL (DRAPES) ×3 IMPLANT
COVER WAND RF STERILE (DRAPES) IMPLANT
DECANTER SPIKE VIAL GLASS SM (MISCELLANEOUS) IMPLANT
DRAPE LAPAROTOMY T 102X78X121 (DRAPES) ×3 IMPLANT
DRAPE UTILITY XL STRL (DRAPES) ×3 IMPLANT
DRSG TEGADERM 4X4.75 (GAUZE/BANDAGES/DRESSINGS) ×3 IMPLANT
ELECT REM PT RETURN 9FT ADLT (ELECTROSURGICAL) ×3
ELECTRODE REM PT RTRN 9FT ADLT (ELECTROSURGICAL) ×1 IMPLANT
GAUZE SPONGE 4X4 12PLY STRL LF (GAUZE/BANDAGES/DRESSINGS) ×2 IMPLANT
GLOVE BIO SURGEON STRL SZ 6.5 (GLOVE) ×1 IMPLANT
GLOVE BIO SURGEON STRL SZ7 (GLOVE) ×5 IMPLANT
GLOVE BIO SURGEONS STRL SZ 6.5 (GLOVE) ×1
GLOVE BIOGEL PI IND STRL 7.0 (GLOVE) IMPLANT
GLOVE BIOGEL PI IND STRL 7.5 (GLOVE) ×1 IMPLANT
GLOVE BIOGEL PI INDICATOR 7.0 (GLOVE) ×2
GLOVE BIOGEL PI INDICATOR 7.5 (GLOVE) ×4
GOWN STRL REUS W/ TWL LRG LVL3 (GOWN DISPOSABLE) ×2 IMPLANT
GOWN STRL REUS W/TWL LRG LVL3 (GOWN DISPOSABLE) ×6
MESH VENTRALEX ST 1-7/10 CRC S (Mesh General) ×2 IMPLANT
NDL HYPO 25X1 1.5 SAFETY (NEEDLE) ×1 IMPLANT
NDL SAFETY ECLIPSE 18X1.5 (NEEDLE) IMPLANT
NEEDLE HYPO 18GX1.5 SHARP (NEEDLE)
NEEDLE HYPO 25X1 1.5 SAFETY (NEEDLE) ×3 IMPLANT
NS IRRIG 1000ML POUR BTL (IV SOLUTION) IMPLANT
PACK BASIN DAY SURGERY FS (CUSTOM PROCEDURE TRAY) ×3 IMPLANT
PENCIL BUTTON HOLSTER BLD 10FT (ELECTRODE) ×3 IMPLANT
SLEEVE SCD COMPRESS KNEE MED (MISCELLANEOUS) ×3 IMPLANT
SPONGE GAUZE 2X2 8PLY STER LF (GAUZE/BANDAGES/DRESSINGS) ×1
SPONGE GAUZE 2X2 8PLY STRL LF (GAUZE/BANDAGES/DRESSINGS) ×2 IMPLANT
STAPLER VISISTAT 35W (STAPLE) IMPLANT
STRIP CLOSURE SKIN 1/2X4 (GAUZE/BANDAGES/DRESSINGS) ×2 IMPLANT
SUT MNCRL AB 4-0 PS2 18 (SUTURE) ×3 IMPLANT
SUT NOVA 0 T19/GS 22DT (SUTURE) ×2 IMPLANT
SUT NOVA NAB DX-16 0-1 5-0 T12 (SUTURE) ×2 IMPLANT
SUT NOVA NAB GS-21 1 T12 (SUTURE) IMPLANT
SUT PDS AB 0 CT 36 (SUTURE) IMPLANT
SUT PROLENE 0 CT 2 (SUTURE) IMPLANT
SUT SILK 3 0 TIES 17X18 (SUTURE)
SUT SILK 3-0 18XBRD TIE BLK (SUTURE) IMPLANT
SUT VIC AB 2-0 CT1 27 (SUTURE)
SUT VIC AB 2-0 CT1 TAPERPNT 27 (SUTURE) IMPLANT
SUT VIC AB 3-0 SH 27 (SUTURE) ×2
SUT VIC AB 3-0 SH 27X BRD (SUTURE) ×1 IMPLANT
SUT VIC AB 4-0 BRD 54 (SUTURE) IMPLANT
SUT VIC AB 4-0 SH 18 (SUTURE) IMPLANT
SUT VICRYL 4-0 PS2 18IN ABS (SUTURE) IMPLANT
SYR BULB 3OZ (MISCELLANEOUS) IMPLANT
SYR CONTROL 10ML LL (SYRINGE) ×3 IMPLANT
TOWEL GREEN STERILE FF (TOWEL DISPOSABLE) ×3 IMPLANT
TOWEL OR NON WOVEN STRL DISP B (DISPOSABLE) ×3 IMPLANT
TUBE CONNECTING 20'X1/4 (TUBING)
TUBE CONNECTING 20X1/4 (TUBING) IMPLANT
YANKAUER SUCT BULB TIP NO VENT (SUCTIONS) IMPLANT

## 2018-01-23 NOTE — Op Note (Signed)
Indications:  This is a 68 year old Spanish-speaking female who is status post laparoscopic cholecystectomy by Dr. Romona Curls at [facility] in 2012. In 2016, the patient presented to the emergency department at that hospital with abdominal pain. She underwent a CT scan on 12/16/14 that showed a small hiatal hernia, small umbilical hernia containing fat, and mild thickening of the transverse colon and hepatic flexure. Apparently the patient does not remember being told about her umbilical hernia. She underwent a colonoscopy by Dr. Oneida Alar in 2017 that showed several small polyps scattered throughout her colon. None of these were malignant. Umbilical hernia has become larger. She was recently seen in the emergency Department for severe pain at the umbilicus. Dr. Jeanell Sparrow was able to reduce the umbilical hernia. She is now referred to [place] for surgical evaluation.  Pre-operative diagnosis:  Umbilical hernia  Post-operative diagnosis:  Same  Procedure:  Umbilical hernia repair with mesh  Procedure Details  The patient was seen again in the Holding Room. The risks, benefits, complications, treatment options, and expected outcomes were discussed with the patient. The possibilities of reaction to medication, pulmonary aspiration, perforation of viscus, bleeding, recurrent infection, the need for additional procedures, and development of a complication requiring transfusion or further operation were discussed with the patient and/or family. There was concurrence with the proposed plan, and informed consent was obtained. The site of surgery was properly noted/marked. The patient was taken to the Operating Room, identified as [facility]cd[facility], and the procedure verified as umbilical hernia repair. A Time Out was held and the above information confirmed.  After an adequate level of general anesthesia was obtained, the patient's abdomen was prepped with Chloraprep and draped in sterile fashion.  We made a  transverse incision below the umbilicus.  Dissection was carried down to the hernia sac with cautery.  We dissected bluntly around the hernia sac down to the edge of the fascial defect.  We reduced the hernia sac back into the pre-peritoneal space.  The fascial defect measured 2 cm across.  We cleared the fascia in all directions.  A small Ventralex mesh was inserted into the pre-peritoneal space and was deployed.  The mesh was secured with four trans-fascial sutures of 0 Novofil.  The fascial defect was closed with multiple interrupted figure-of-eight 1 Novofil sutures.  The base of the umbilicus was tacked down with 3-0 Vicryl.  3-0 Vicryl was used to close the subcutaneous tissues and 4-0 Monocryl was used to close the skin.  Steri-strips and clean dressing were applied.  The patient was extubated and brought to the recovery room in stable condition.  All sponge, instrument, and needle counts were correct prior to closure and at the conclusion of the case.   Estimated Blood Loss: Minimal          Complications: None; patient tolerated the procedure well.         Disposition: PACU - hemodynamically stable.         Condition: stable  Imogene Burn. Georgette Dover, MD, [facility]  General/ Trauma Surgery Beeper [phone]  01/23/2018 12:28 PM

## 2018-01-23 NOTE — Anesthesia Postprocedure Evaluation (Signed)
Anesthesia Post Note  Patient: Lisa Mcdowell  Procedure(s) Performed: UMBILICAL HERNIA REPAIR WITH MESH (N/A Abdomen) INSERTION OF MESH (N/A Abdomen)     Patient location during evaluation: PACU Anesthesia Type: General Level of consciousness: sedated Pain management: pain level controlled Vital Signs Assessment: post-procedure vital signs reviewed and stable Respiratory status: spontaneous breathing and respiratory function stable Cardiovascular status: stable Postop Assessment: no apparent nausea or vomiting Anesthetic complications: no    Last Vitals:  Vitals:   01/23/18 1330 01/23/18 1345  BP: 140/86 (!) 150/80  Pulse: (!) 58 62  Resp: 13 16  Temp:  36.4 C  SpO2: 100% 100%    Last Pain:  Vitals:   01/23/18 1345  TempSrc:   PainSc: 3                  Bayan Kushnir DANIEL

## 2018-01-23 NOTE — Anesthesia Preprocedure Evaluation (Addendum)
Anesthesia Evaluation  Patient identified by MRN, date of birth, ID band Patient awake    Reviewed: Allergy & Precautions, NPO status , Patient's Chart, lab work & pertinent test results  Airway Mallampati: III  TM Distance: >3 FB Neck ROM: Full  Mouth opening: Limited Mouth Opening  Dental no notable dental hx. (+) Edentulous Upper, Edentulous Lower   Pulmonary neg pulmonary ROS,    Pulmonary exam normal breath sounds clear to auscultation       Cardiovascular negative cardio ROS Normal cardiovascular exam Rhythm:Regular Rate:Normal     Neuro/Psych negative neurological ROS  negative psych ROS   GI/Hepatic Neg liver ROS, GERD  ,  Endo/Other  negative endocrine ROS  Renal/GU negative Renal ROS  negative genitourinary   Musculoskeletal negative musculoskeletal ROS (+)   Abdominal   Peds  Hematology negative hematology ROS (+)   Anesthesia Other Findings Umbilical hernia  Reproductive/Obstetrics negative OB ROS                            Anesthesia Physical Anesthesia Plan  ASA: II  Anesthesia Plan: General and Regional   Post-op Pain Management:  Regional for Post-op pain   Induction: Intravenous  PONV Risk Score and Plan: 3 and Ondansetron, Dexamethasone and Midazolam  Airway Management Planned: LMA  Additional Equipment:   Intra-op Plan:   Post-operative Plan: Extubation in OR  Informed Consent: I have reviewed the patients History and Physical, chart, labs and discussed the procedure including the risks, benefits and alternatives for the proposed anesthesia with the patient or authorized representative who has indicated his/her understanding and acceptance.   Dental advisory given  Plan Discussed with: CRNA  Anesthesia Plan Comments:         Anesthesia Quick Evaluation

## 2018-01-23 NOTE — Transfer of Care (Signed)
Immediate Anesthesia Transfer of Care Note  Patient: Lisa Mcdowell  Procedure(s) Performed: UMBILICAL HERNIA REPAIR WITH MESH (N/A Abdomen) INSERTION OF MESH (N/A Abdomen)  Patient Location: PACU  Anesthesia Type:General  Level of Consciousness: sedated  Airway & Oxygen Therapy: Patient Spontanous Breathing and Patient connected to face mask oxygen  Post-op Assessment: Report given to RN and Post -op Vital signs reviewed and stable  Post vital signs: Reviewed and stable  Last Vitals:  Vitals Value Taken Time  BP 111/57 01/23/2018 12:35 PM  Temp    Pulse 62 01/23/2018 12:38 PM  Resp 12 01/23/2018 12:38 PM  SpO2 99 % 01/23/2018 12:38 PM  Vitals shown include unvalidated device data.  Last Pain:  Vitals:   01/23/18 1023  TempSrc: Oral  PainSc: 0-No pain      Patients Stated Pain Goal: 3 (64/40/34 7425)  Complications: No apparent anesthesia complications

## 2018-01-23 NOTE — H&P (Signed)
History of Present Illness  The patient is a 68 year old female who presents with an umbilical hernia.  Referred by Dr. Pattricia Boss for umbilical hernia  This is a 68 year old Spanish-speaking female who is status post laparoscopic cholecystectomy by Dr. Romona Curls at [facility] in 2012. In 2016, the patient presented to the emergency department at that hospital with abdominal pain. She underwent a CT scan on 12/16/14 that showed a small hiatal hernia, small umbilical hernia containing fat, and mild thickening of the transverse colon and hepatic flexure. Apparently the patient does not remember being told about her umbilical hernia. She underwent a colonoscopy by Dr. Oneida Alar in 2017 that showed several small polyps scattered throughout her colon. None of these were malignant. Umbilical hernia has become larger. She was recently seen in the emergency Department for severe pain at the umbilicus. Dr. Jeanell Sparrow was able to reduce the umbilical hernia. She is now referred to [place] for surgical evaluation.  CLINICAL DATA: Nausea, diarrhea, fever this morning, upper abdominal pain for 4 months  EXAM: CT ABDOMEN AND PELVIS WITH CONTRAST  TECHNIQUE: Multidetector CT imaging of the abdomen and pelvis was performed using the standard protocol following bolus administration of intravenous contrast.  CONTRAST: 160mL OMNIPAQUE IOHEXOL 300 MG/ML SOLN  COMPARISON: None.  FINDINGS: Small hiatal hernia is noted. There is contrast material in distal esophagus suspicious for gastroesophageal reflux disease. Mild fatty infiltration of the liver. The patient is status postcholecystectomy.  The lung bases are unremarkable.  The pancreas, spleen and adrenal glands are unremarkable. Kidneys are symmetrical in size and enhancement. No hydronephrosis or hydroureter.  There is umbilical hernia containing fat measures 3.5 cm without evidence of acute complication.  No small bowel  obstruction. No ascites or free air. No adenopathy. The uterus is atrophic. Ovaries are unremarkable.  Normal appendix. No pericecal inflammation. The terminal ileum is unremarkable. There is mild thickening of the wall of the distal transverse colon and splenic flexure of the colon. Colitis cannot be excluded. The colon is empty.  Mild thickening of colonic wall in descending colon. Sigmoid colon is unremarkable. There is no distal colonic obstruction. Urinary bladder is unremarkable. Small nonspecific bilateral inguinal lymph nodes are noted. No destructive bony lesions are noted within pelvis.  Sagittal images of the spine are unremarkable.  Delayed renal images shows bilateral renal symmetrical excretion.  IMPRESSION: 1. Small hiatal hernia. There is contrast material within distal esophagus suspicious for gastroesophageal reflux.  2. There is mild thickening of colonic wall in transverse colon and hepatic flexure of the colon. Mild thickening of colonic wall in descending colon. Colitis cannot be excluded. Clinical correlation is necessary. 3. No mesenteric fluid collection. No small bowel obstruction. Normal appendix. No pericecal inflammation. 4. Small umbilical hernia containing fat without evidence of acute complication. 5. No hydronephrosis or hydroureter. Unremarkable uterus and adnexa.   Electronically Signed By: Lahoma Crocker M.D. On: 12/16/2014 13:00   Past Surgical History  Gallbladder Surgery - Open  Diagnostic Studies History  Colonoscopy never Mammogram 1-3 years ago  Allergies No Known Drug Allergies  Medication History Zetia (10MG  Tablet, Oral) Active. Medications Reconciled  Social History  No alcohol use No caffeine use No drug use Tobacco use Never smoker.  Family History Diabetes Mellitus Daughter.  Pregnancy / Birth History  Age at menarche 50 years. Age of menopause 35-55 Gravida 3 Maternal age  21-20 Para 3     Review of Systems General Not Present- Appetite Loss, Chills, Fatigue, Fever, Night Sweats,  Weight Gain and Weight Loss. Skin Not Present- Change in Wart/Mole, Dryness, Hives, Jaundice, New Lesions, Non-Healing Wounds, Rash and Ulcer. HEENT Not Present- Earache, Hearing Loss, Hoarseness, Nose Bleed, Oral Ulcers, Ringing in the Ears, Seasonal Allergies, Sinus Pain, Sore Throat, Visual Disturbances, Wears glasses/contact lenses and Yellow Eyes. Respiratory Not Present- Bloody sputum, Chronic Cough, Difficulty Breathing, Snoring and Wheezing. Breast Not Present- Breast Mass, Breast Pain, Nipple Discharge and Skin Changes. Cardiovascular Not Present- Chest Pain, Difficulty Breathing Lying Down, Leg Cramps, Palpitations, Rapid Heart Rate, Shortness of Breath and Swelling of Extremities. Gastrointestinal Present- Abdominal Pain. Not Present- Bloating, Bloody Stool, Change in Bowel Habits, Chronic diarrhea, Constipation, Difficulty Swallowing, Excessive gas, Gets full quickly at meals, Hemorrhoids, Indigestion, Nausea, Rectal Pain and Vomiting. Female Genitourinary Not Present- Frequency, Nocturia, Painful Urination, Pelvic Pain and Urgency. Neurological Not Present- Decreased Memory, Fainting, Headaches, Numbness, Seizures, Tingling, Tremor, Trouble walking and Weakness. Psychiatric Not Present- Anxiety, Bipolar, Change in Sleep Pattern, Depression, Fearful and Frequent crying. Endocrine Not Present- Cold Intolerance, Excessive Hunger, Hair Changes, Heat Intolerance, Hot flashes and New Diabetes. Hematology Not Present- Blood Thinners, Easy Bruising, Excessive bleeding, Gland problems, HIV and Persistent Infections.  Vitals  Weight: 167.4 lb Height: 68in Body Surface Area: 1.9 m Body Mass Index: 25.45 kg/m  Temp.: 98.76F  Pulse: 88 (Regular)  P.OX: 99% (Room air) BP: 134/84 (Sitting, Left Arm, Standard)      Physical Exam   The physical exam  findings are as follows: Note:WDWN in NAD Eyes: Pupils equal, round; sclera anicteric HENT: Oral mucosa moist; good dentition Neck: No masses palpated, no thyromegaly Lungs: CTA bilaterally; normal respiratory effort CV: Regular rate and rhythm; no murmurs; extremities well-perfused with no edema Abd: +bowel sounds, soft, non-tender, no palpable organomegaly; protruding umbilical hernia with approximately 4 cm diameter hernia sac The hernia is reducible with a defect of about 2 cm. Skin: Warm, dry; no sign of jaundice Psychiatric - alert and oriented x 4; calm mood and affect    Assessment & Plan   UMBILICAL HERNIA WITHOUT OBSTRUCTION OR GANGRENE (K42.9)  Current Plans Schedule for Surgery - Umbilical hernia repair with mesh. The surgical procedure has been discussed with the patient. Potential risks, benefits, alternative treatments, and expected outcomes have been explained. All of the patient's questions at this time have been answered. The likelihood of reaching the patient's treatment goal is good. The patient understand the proposed surgical procedure and wishes to proceed.   Lisa Mcdowell. Georgette Dover, MD, [facility] Surgery  General/ Trauma Surgery Beeper [phone]  01/23/2018 11:15 AM

## 2018-01-23 NOTE — Discharge Instructions (Signed)
NO TYLENOL BEFORE  4:30pm Today!       CC[facility] _______[facility]  UMBILICAL HERNIA REPAIR: POST OP INSTRUCTIONS  Always review your discharge instruction sheet given to you by the facility where your surgery was performed. IF YOU HAVE DISABILITY OR FAMILY LEAVE FORMS, YOU MUST BRING THEM TO THE OFFICE FOR PROCESSING.   DO NOT GIVE THEM TO YOUR DOCTOR.  1. A  prescription for pain medication may be given to you upon discharge.  Take your pain medication as prescribed, if needed.  If narcotic pain medicine is not needed, then you may take acetaminophen (Tylenol) or ibuprofen (Advil) as needed. 2. Take your usually prescribed medications unless otherwise directed. If you need a refill on your pain medication, please contact your pharmacy.  They will contact our office to request authorization. Prescriptions will not be filled after 5 pm or on week-ends. 3. You should follow a light diet the first 24 hours after arrival home, such as soup and crackers, etc.  Be sure to include lots of fluids daily.  Resume your normal diet the day after surgery. 4.Most patients will experience some swelling and bruising around the umbilicus.  Ice packs and reclining will help.  Swelling and bruising can take several days to resolve.  5. It is common to experience some constipation if taking pain medication after surgery.  Increasing fluid intake and taking a stool softener (such as Colace) will usually help or prevent this problem from occurring.  A mild laxative (Milk of Magnesia or Miralax) should be taken according to package directions if there are no bowel movements after 48 hours. 6. Unless discharge instructions indicate otherwise, you may remove your bandages 24-48 hours after surgery, and you may shower at that time.  You may have steri-strips (small skin tapes) in place directly over the incision.  These strips should be left on the skin for 7-10 days.  7. ACTIVITIES:  You may resume  regular (light) daily activities beginning the next day--such as daily self-care, walking, climbing stairs--gradually increasing activities as tolerated.  You may have sexual intercourse when it is comfortable.  Refrain from any heavy lifting or straining until approved by your doctor.  a.You may drive when you are no longer taking prescription pain medication, you can comfortably wear a seatbelt, and you can safely maneuver your car and apply brakes. b.RETURN TO WORK:   _____________________________________________  8.You should see your doctor in the office for a follow-up appointment approximately 2-3 weeks after your surgery.  Make sure that you call for this appointment within a day or two after you arrive home to insure a convenient appointment time.  WHEN TO CALL YOUR DOCTOR: 1. Fever over 101.0 2. Inability to urinate 3. Nausea and/or vomiting 4. Extreme swelling or bruising 5. Continued bleeding from incision. 6. Increased pain, redness, or drainage from the incision  The clinic staff is available to answer your questions during regular business hours.  Please dont hesitate to call and ask to speak to one of the nurses for clinical concerns.  If you have a medical emergency, go to the nearest emergency room or call 911.  A surgeon from [facility] is always on call at the hospital   [address], [place], [place], [place]  [address] ?  [address], [place],    [phone] ? [phone] ? FAX [phone] Web site: [email or web address]      Post Anesthesia Home Care Instructions  Activity: Get  plenty of rest for the remainder of the day. A responsible individual must stay with you for 24 hours following the procedure.  For the next 24 hours, DO NOT: -Drive a car -Paediatric nurse -Drink alcoholic beverages -Take any medication unless instructed by your physician -Make any legal decisions or sign important  papers.  Meals: Start with liquid foods such as gelatin or soup. Progress to regular foods as tolerated. Avoid greasy, spicy, heavy foods. If nausea and/or vomiting occur, drink only clear liquids until the nausea and/or vomiting subsides. Call your physician if vomiting continues.  Special Instructions/Symptoms: Your throat may feel dry or sore from the anesthesia or the breathing tube placed in your throat during surgery. If this causes discomfort, gargle with warm salt water. The discomfort should disappear within 24 hours.  If you had a scopolamine patch placed behind your ear for the management of post- operative nausea and/or vomiting:  1. The medication in the patch is effective for 72 hours, after which it should be removed.  Wrap patch in a tissue and discard in the trash. Wash hands thoroughly with soap and water. 2. You may remove the patch earlier than 72 hours if you experience unpleasant side effects which may include dry mouth, dizziness or visual disturbances. 3. Avoid touching the patch. Wash your hands with soap and water after contact with the patch.

## 2018-01-23 NOTE — Anesthesia Procedure Notes (Signed)
Procedure Name: LMA Insertion Date/Time: 01/23/2018 11:46 AM Performed by: Maryella Shivers, CRNA Pre-anesthesia Checklist: Patient identified, Emergency Drugs available, Suction available and Patient being monitored Patient Re-evaluated:Patient Re-evaluated prior to induction Oxygen Delivery Method: Circle system utilized Preoxygenation: Pre-oxygenation with 100% oxygen Induction Type: IV induction Ventilation: Mask ventilation without difficulty LMA: LMA inserted LMA Size: 4.0 Number of attempts: 1 Airway Equipment and Method: Bite block Placement Confirmation: positive ETCO2 Tube secured with: Tape Dental Injury: Teeth and Oropharynx as per pre-operative assessment

## 2018-01-24 ENCOUNTER — Encounter (HOSPITAL_BASED_OUTPATIENT_CLINIC_OR_DEPARTMENT_OTHER): Payer: Self-pay | Admitting: Surgery

## 2018-03-31 ENCOUNTER — Encounter: Payer: Self-pay | Admitting: Gastroenterology

## 2018-08-01 ENCOUNTER — Emergency Department (HOSPITAL_COMMUNITY)
Admission: EM | Admit: 2018-08-01 | Discharge: 2018-08-02 | Disposition: A | Discharge status: Home / Self Care | Payer: Self-pay | Attending: Emergency Medicine | Admitting: Emergency Medicine

## 2018-08-01 ENCOUNTER — Other Ambulatory Visit: Payer: Self-pay

## 2018-08-01 ENCOUNTER — Encounter (HOSPITAL_COMMUNITY): Payer: Self-pay | Admitting: Emergency Medicine

## 2018-08-01 DIAGNOSIS — R51 Headache: Secondary | ICD-10-CM | POA: Insufficient documentation

## 2018-08-01 DIAGNOSIS — R21 Rash and other nonspecific skin eruption: Secondary | ICD-10-CM | POA: Insufficient documentation

## 2018-08-01 MED ORDER — DEXAMETHASONE 4 MG PO TABS
10.0000 mg | ORAL_TABLET | Freq: Once | ORAL | Status: AC
  Administered 2018-08-01: 10 mg via ORAL
  Filled 2018-08-01: qty 3

## 2018-08-01 MED ORDER — DIPHENHYDRAMINE HCL 25 MG PO CAPS
25.0000 mg | ORAL_CAPSULE | Freq: Once | ORAL | Status: AC
  Administered 2018-08-01: 25 mg via ORAL
  Filled 2018-08-01: qty 1

## 2018-08-01 NOTE — ED Triage Notes (Signed)
Patient speaks no [place]. Patient has daughter as an interpreter with her. Patient has severe rash on right arm and down her back. Patient also complains of headache and weakness that started today.

## 2018-08-02 MED ORDER — METHYLPREDNISOLONE 4 MG PO TBPK: ORAL_TABLET | ORAL | 0 refills | Status: DC

## 2018-08-02 MED ORDER — HYDROXYZINE HCL 25 MG PO TABS: 25.0000 mg | ORAL_TABLET | Freq: Three times a day (TID) | ORAL | 0 refills | Status: DC | PRN

## 2018-08-02 NOTE — Discharge Instructions (Addendum)
You were seen today for an ongoing rash.  Take medications as prescribed.  It is very important that you follow-up with dermatology.

## 2018-08-02 NOTE — ED Provider Notes (Signed)
[facility] EMERGENCY DEPARTMENT Provider Note   CSN: [id number] Arrival date & time: 08/01/18  2254     History   Chief Complaint Chief Complaint  Patient presents with  . Rash    headache    HPI Lisa Mcdowell is a 68 y.o. female.     HPI  This is a 68 year old female who presents with ongoing rash.  Patient's daughter provides majority of history.  Patient's daughter reports ongoing rash over the last several months.  She has seen both dermatology and an allergist.  She was started on topical steroids, antihistamines, and oral steroids.  Daughter reports that the doctors feel that the rash was related to hair dye.  She has not used hair dye in 1 month.  Daughter reports that some of the rash has improved but over the last 2 days she has had recurrence of rash over her arm, fingers, and back.  She has had worsening itching and pain.  She finished a course of steroids yesterday.  She denies any fevers.  However, today she is generally not felt well and has had a headache.  No new soaps or detergents.  No tick bites or exposures.  Past Medical History:  Diagnosis Date  . Cholelithiasis   . Gallstones 08/08/2010  . GERD (gastroesophageal reflux disease)    pt denies having GERD on day of surgery  . Hyperlipidemia   . Nausea & vomiting     Patient Active Problem List   Diagnosis Date Noted  . Encounter for screening colonoscopy 03/24/2015  . Essential tremor 02/06/2015  . Infectious colitis 12/16/2014  . Hyperlipidemia 10/28/2014  . GERD (gastroesophageal reflux disease) 10/28/2014  . Hemorrhoids 10/28/2014  . Gallstones 08/08/2010    Past Surgical History:  Procedure Laterality Date  . CHOLECYSTECTOMY  08/08/2010   Procedure: LAPAROSCOPIC CHOLECYSTECTOMY;  Surgeon: Scherry Ran;  Location: [facility];  Service: General;  Laterality: N/A;  . COLONOSCOPY N/A 04/15/2015   Procedure: COLONOSCOPY;  Surgeon: Danie Binder, MD;  Location: [facility];  Service:  Endoscopy;  Laterality: N/A;  1015 - moved to 10:00 - office to notify - interpreter scheduled, do NOT change time  . INSERTION OF MESH N/A 01/23/2018   Procedure: INSERTION OF MESH;  Surgeon: Donnie Mesa, MD;  Location: [place];  Service: General;  Laterality: N/A;  . UMBILICAL HERNIA REPAIR N/A 01/23/2018   Procedure: UMBILICAL HERNIA REPAIR WITH MESH;  Surgeon: Donnie Mesa, MD;  Location: [place];  Service: General;  Laterality: N/A;  . VARICOSE VEIN SURGERY  2011   [facility]     OB History   No obstetric history on file.      Home Medications    Prior to Admission medications   Medication Sig Start Date End Date Taking? Authorizing Provider  ezetimibe (ZETIA) 10 MG tablet Take 10 mg by mouth daily.    [provider]  hydrOXYzine (ATARAX/VISTARIL) 25 MG tablet Take 1 tablet (25 mg total) by mouth every 8 (eight) hours as needed for itching. 08/02/18   Horton, Barbette Hair, MD  methylPREDNISolone (MEDROL DOSEPAK) 4 MG TBPK tablet Take as directed on packet 08/02/18   Horton, Barbette Hair, MD  oxyCODONE (OXY IR/ROXICODONE) 5 MG immediate release tablet Take 1 tablet (5 mg total) by mouth every 6 (six) hours as needed for severe pain. 01/23/18   Donnie Mesa, MD    Family History Family History  Problem Relation Age of Onset  . Colon cancer  Neg Hx     Social History Social History   Tobacco Use  . Smoking status: Never Smoker  . Smokeless tobacco: Never Used  Substance Use Topics  . Alcohol use: No  . Drug use: No     Allergies   Patient has no known allergies.   Review of Systems Review of Systems  Constitutional: Negative for fever.  Respiratory: Negative for shortness of breath.   Cardiovascular: Negative for chest pain.  Gastrointestinal: Negative for abdominal pain, nausea and vomiting.  Genitourinary: Negative for dysuria.  Skin: Positive for rash.  Neurological: Positive for headaches.  All other systems reviewed  and are negative.    Physical Exam Updated Vital Signs BP (!) 142/84 (BP Location: Left Arm)   Pulse 92   Temp 99.5 F (37.5 C) (Oral)   Resp 18   Ht 1.676 m (5\' 6" )   Wt 72.6 kg   SpO2 99%   BMI 25.82 kg/m   Physical Exam Vitals signs and nursing note reviewed.  Constitutional:      Appearance: She is well-developed.  HENT:     Head: Normocephalic and atraumatic.     Nose: Nose normal.  Eyes:     Pupils: Pupils are equal, round, and reactive to light.  Neck:     Musculoskeletal: Neck supple.  Cardiovascular:     Rate and Rhythm: Normal rate and regular rhythm.     Heart sounds: Normal heart sounds.  Pulmonary:     Effort: Pulmonary effort is normal. No respiratory distress.     Breath sounds: No wheezing.  Musculoskeletal:     Right lower leg: No edema.     Left lower leg: No edema.  Skin:    General: Skin is warm and dry.     Findings: Rash present.     Comments: Blanching erythematous and hyperemic rash noted over the right proximal arm, bilateral buttock, left flank, and scattered over the bilateral hands, spares the palms and soles, blanching.  Patient also has some hyperemia around the scalp line.  No petechiae or pustules noted.  No vesicular lesions noted.  Rash crosses midline  Neurological:     Mental Status: She is alert and oriented to person, place, and time.  Psychiatric:        Mood and Affect: Mood normal.      ED Treatments / Results  Labs (all labs ordered are listed, but only abnormal results are displayed) Labs Reviewed - No data to display  EKG None  Radiology No results found.  Procedures Procedures (including critical care time)  Medications Ordered in ED Medications  diphenhydrAMINE (BENADRYL) capsule 25 mg (25 mg Oral Given 08/01/18 2357)  dexamethasone (DECADRON) tablet 10 mg (10 mg Oral Given 08/01/18 2358)     Initial Impression / Assessment and Plan / ED Course  I have reviewed the triage vital signs and the nursing  notes.  Pertinent labs & imaging results that were available during my care of the patient were reviewed by me and considered in my medical decision making (see chart for details).        Patient presents with a persistent rash.  She has seen both allergy and dermatology as an outpatient and is on multiple medications.  She is finished a steroid pack yesterday but has had recurrence of some of the rash.  She is overall nontoxic-appearing vital signs are reassuring.  No signs or symptoms of anaphylaxis.  Rash is very hyperemic but blanching in nature.  Could very well be consistent with contact dermatitis or allergy.  Low suspicion at this time for tickborne illness or infectious etiology.  Not consistent with shingles.  I discussed at length with the daughter and patient that she needs to continue to follow closely with dermatology.  She was dosed Decadron and Benadryl.  Will discharge with Atarax and Medrol Dosepak.  After history, exam, and medical workup I feel the patient has been appropriately medically screened and is safe for discharge home. Pertinent diagnoses were discussed with the patient. Patient was given return precautions.   Final Clinical Impressions(s) / ED Diagnoses   Final diagnoses:  Rash and nonspecific skin eruption    ED Discharge Orders         Ordered    methylPREDNISolone (MEDROL DOSEPAK) 4 MG TBPK tablet     08/02/18 0052    hydrOXYzine (ATARAX/VISTARIL) 25 MG tablet  Every 8 hours PRN     08/02/18 0052           Merryl Hacker, MD 08/02/18 [phone]

## 2018-08-10 ENCOUNTER — Encounter (HOSPITAL_COMMUNITY): Payer: Self-pay

## 2018-08-10 ENCOUNTER — Inpatient Hospital Stay (HOSPITAL_COMMUNITY)
Admission: EM | Admit: 2018-08-10 | Discharge: 2018-08-15 | DRG: 547 | Disposition: A | Discharge status: Home / Self Care | Payer: Medicaid Other | Attending: Internal Medicine | Admitting: Internal Medicine

## 2018-08-10 ENCOUNTER — Other Ambulatory Visit: Payer: Self-pay

## 2018-08-10 DIAGNOSIS — E876 Hypokalemia: Secondary | ICD-10-CM | POA: Diagnosis not present

## 2018-08-10 DIAGNOSIS — R740 Nonspecific elevation of levels of transaminase and lactic acid dehydrogenase [LDH]: Secondary | ICD-10-CM | POA: Diagnosis present

## 2018-08-10 DIAGNOSIS — K219 Gastro-esophageal reflux disease without esophagitis: Secondary | ICD-10-CM

## 2018-08-10 DIAGNOSIS — M3312 Other dermatopolymyositis with myopathy: Principal | ICD-10-CM | POA: Diagnosis present

## 2018-08-10 DIAGNOSIS — M6282 Rhabdomyolysis: Secondary | ICD-10-CM | POA: Diagnosis present

## 2018-08-10 DIAGNOSIS — E11649 Type 2 diabetes mellitus with hypoglycemia without coma: Secondary | ICD-10-CM | POA: Diagnosis not present

## 2018-08-10 DIAGNOSIS — R748 Abnormal levels of other serum enzymes: Secondary | ICD-10-CM | POA: Diagnosis present

## 2018-08-10 DIAGNOSIS — R21 Rash and other nonspecific skin eruption: Secondary | ICD-10-CM

## 2018-08-10 DIAGNOSIS — M3310 Other dermatopolymyositis, organ involvement unspecified: Secondary | ICD-10-CM | POA: Diagnosis present

## 2018-08-10 DIAGNOSIS — Z20828 Contact with and (suspected) exposure to other viral communicable diseases: Secondary | ICD-10-CM | POA: Diagnosis present

## 2018-08-10 DIAGNOSIS — E785 Hyperlipidemia, unspecified: Secondary | ICD-10-CM

## 2018-08-10 DIAGNOSIS — L944 Gottron's papules: Secondary | ICD-10-CM | POA: Diagnosis present

## 2018-08-10 LAB — COMPREHENSIVE METABOLIC PANEL
ALT: 109 U/L — ABNORMAL HIGH (ref 0–44)
AST: 374 U/L — ABNORMAL HIGH (ref 15–41)
Albumin: 3.2 g/dL — ABNORMAL LOW (ref 3.5–5.0)
Alkaline Phosphatase: 52 U/L (ref 38–126)
Anion gap: 9 (ref 5–15)
BUN: 18 mg/dL (ref 8–23)
CO2: 25 mmol/L (ref 22–32)
Calcium: 8.6 mg/dL — ABNORMAL LOW (ref 8.9–10.3)
Chloride: 100 mmol/L (ref 98–111)
Creatinine, Ser: 0.63 mg/dL (ref 0.44–1.00)
GFR calc Af Amer: >60 mL/min (ref >60)
GFR calc non Af Amer: >60 mL/min (ref >60)
Glucose, Bld: 104 mg/dL — ABNORMAL HIGH (ref 70–99)
Potassium: 4.1 mmol/L (ref 3.5–5.1)
Sodium: 134 mmol/L — ABNORMAL LOW (ref 135–145)
Total Bilirubin: 0.7 mg/dL (ref 0.3–1.2)
Total Protein: 7.1 g/dL (ref 6.5–8.1)

## 2018-08-10 LAB — URINALYSIS, ROUTINE W REFLEX MICROSCOPIC
Bacteria, UA: NONE SEEN
Bilirubin Urine: NEGATIVE
Glucose, UA: NEGATIVE mg/dL
Ketones, ur: NEGATIVE mg/dL
Nitrite: NEGATIVE
Protein, ur: NEGATIVE mg/dL
Specific Gravity, Urine: 1.019 (ref 1.005–1.030)
pH: 5 (ref 5.0–8.0)

## 2018-08-10 LAB — CBC WITH DIFFERENTIAL/PLATELET
Abs Immature Granulocytes: 0.04 10*3/uL (ref 0.00–0.07)
Basophils Absolute: 0 10*3/uL (ref 0.0–0.1)
Basophils Relative: 0 %
Eosinophils Absolute: 0.2 10*3/uL (ref 0.0–0.5)
Eosinophils Relative: 3 %
HCT: 45.3 % (ref 36.0–46.0)
Hemoglobin: 15.1 g/dL — ABNORMAL HIGH (ref 12.0–15.0)
Immature Granulocytes: 1 %
Lymphocytes Relative: 15 %
Lymphs Abs: 1 10*3/uL (ref 0.7–4.0)
MCH: 33.6 pg (ref 26.0–34.0)
MCHC: 33.3 g/dL (ref 30.0–36.0)
MCV: 100.9 fL — ABNORMAL HIGH (ref 80.0–100.0)
Monocytes Absolute: 0.3 10*3/uL (ref 0.1–1.0)
Monocytes Relative: 5 %
Neutro Abs: 5.3 10*3/uL (ref 1.7–7.7)
Neutrophils Relative %: 76 %
Platelets: 195 10*3/uL (ref 150–400)
RBC: 4.49 MIL/uL (ref 3.87–5.11)
RDW: 13 % (ref 11.5–15.5)
WBC: 6.9 10*3/uL (ref 4.0–10.5)
nRBC: 0 % (ref 0.0–0.2)

## 2018-08-10 LAB — GLUCOSE, CAPILLARY: Glucose-Capillary: 124 mg/dL — ABNORMAL HIGH (ref 70–99)

## 2018-08-10 LAB — CK: Total CK: 10282 U/L — ABNORMAL HIGH (ref 38–234)

## 2018-08-10 LAB — SARS CORONAVIRUS 2 BY RT PCR (HOSPITAL ORDER, PERFORMED IN ~~LOC~~ HOSPITAL LAB): SARS Coronavirus 2: NEGATIVE

## 2018-08-10 MED ORDER — ACETAMINOPHEN 325 MG PO TABS
650.0000 mg | ORAL_TABLET | Freq: Four times a day (QID) | ORAL | Status: DC | PRN
  Administered 2018-08-12: 650 mg via ORAL
  Filled 2018-08-10 (×3): qty 2

## 2018-08-10 MED ORDER — INSULIN ASPART 100 UNIT/ML ~~LOC~~ SOLN: 0.0000 [IU] | Freq: Every day | SUBCUTANEOUS | Status: DC

## 2018-08-10 MED ORDER — SODIUM CHLORIDE 0.9 % IV BOLUS
500.0000 mL | Freq: Once | INTRAVENOUS | Status: AC
  Administered 2018-08-10: 500 mL via INTRAVENOUS

## 2018-08-10 MED ORDER — PREDNISONE 20 MG PO TABS: 60.0000 mg | ORAL_TABLET | Freq: Every day | ORAL | Status: DC

## 2018-08-10 MED ORDER — SODIUM CHLORIDE 0.9 % IV BOLUS
1000.0000 mL | Freq: Once | INTRAVENOUS | Status: AC
  Administered 2018-08-10: 1000 mL via INTRAVENOUS

## 2018-08-10 MED ORDER — INSULIN ASPART 100 UNIT/ML ~~LOC~~ SOLN
0.0000 [IU] | Freq: Three times a day (TID) | SUBCUTANEOUS | Status: DC
  Administered 2018-08-11 – 2018-08-13 (×4): 2 [IU] via SUBCUTANEOUS
  Administered 2018-08-14: 3 [IU] via SUBCUTANEOUS
  Administered 2018-08-14: 2 [IU] via SUBCUTANEOUS

## 2018-08-10 MED ORDER — ACETAMINOPHEN 650 MG RE SUPP: 650.0000 mg | Freq: Four times a day (QID) | RECTAL | Status: DC | PRN

## 2018-08-10 MED ORDER — PREDNISONE 50 MG PO TABS
60.0000 mg | ORAL_TABLET | Freq: Every day | ORAL | Status: DC
  Administered 2018-08-10: 23:00:00 60 mg via ORAL
  Filled 2018-08-10 (×2): qty 1

## 2018-08-10 MED ORDER — SENNOSIDES-DOCUSATE SODIUM 8.6-50 MG PO TABS: 1.0000 | ORAL_TABLET | Freq: Every evening | ORAL | Status: DC | PRN

## 2018-08-10 MED ORDER — SODIUM CHLORIDE 0.9 % IV SOLN
INTRAVENOUS | Status: AC
  Administered 2018-08-10 – 2018-08-11 (×2): via INTRAVENOUS

## 2018-08-10 MED ORDER — PREDNISONE 20 MG PO TABS: 80.0000 mg | ORAL_TABLET | Freq: Every day | ORAL | Status: DC

## 2018-08-10 MED ORDER — ENOXAPARIN SODIUM 40 MG/0.4ML ~~LOC~~ SOLN
40.0000 mg | SUBCUTANEOUS | Status: DC
  Administered 2018-08-10 – 2018-08-14 (×5): 40 mg via SUBCUTANEOUS
  Filled 2018-08-10 (×5): qty 0.4

## 2018-08-10 NOTE — ED Notes (Signed)
ED TO INPATIENT HANDOFF REPORT  ED Nurse Name and Phone #:  Patty [phone]  S Name/Age/Gender Lisa Mcdowell 68 y.o. female Room/Bed: 045C/045C  Code Status   Code Status: Not on file  Home/SNF/Other Home Patient oriented to: self, place, time and situation Is this baseline? Yes   Triage Complete: Triage complete  Chief Complaint joint pain  Triage Note Pt from home w/ a c/o a rash for 4 months. One night she felt like bugs were in her bed and biting her all over. She was unable to find any bugs but then woke up with a rash on her scalp, ears, upper back, legs, and buttocks. She reports pain, burning, and itching. She has been to a dermatologist and has taken many meds for tx. Antihistamines make the symptoms worse. She has given a skin sample.    Allergies No Known Allergies  Level of Care/Admitting Diagnosis ED Disposition    ED Disposition Condition Comment   Admit  The patient appears reasonably stabilized for admission considering the current resources, flow, and capabilities available in the ED at this time, and I doubt any other [facility] requiring further screening and/or treatment in the ED prior to admission is  present.       B Medical/Surgery History Past Medical History:  Diagnosis Date  . Cholelithiasis   . Gallstones 08/08/2010  . GERD (gastroesophageal reflux disease)    pt denies having GERD on day of surgery  . Hyperlipidemia   . Nausea & vomiting    Past Surgical History:  Procedure Laterality Date  . CHOLECYSTECTOMY  08/08/2010   Procedure: LAPAROSCOPIC CHOLECYSTECTOMY;  Surgeon: Scherry Ran;  Location: [facility];  Service: General;  Laterality: N/A;  . COLONOSCOPY N/A 04/15/2015   Procedure: COLONOSCOPY;  Surgeon: Danie Binder, MD;  [facility];  Service: Endoscopy;  Laterality: N/A;  1015 - moved to 10:00 - office to notify - interpreter scheduled, do NOT change time  . INSERTION OF MESH N/A 01/23/2018   Procedure: INSERTION OF MESH;   Surgeon: Donnie Mesa, MD;  Location: [place];  Service: General;  Laterality: N/A;  . UMBILICAL HERNIA REPAIR N/A 01/23/2018   Procedure: UMBILICAL HERNIA REPAIR WITH MESH;  Surgeon: Donnie Mesa, MD;  Location: [place];  Service: General;  Laterality: N/A;  . [place]     A IV Location/Drains/Wounds Patient Lines/Drains/Airways Status   Active Line/Drains/Airways    Name:   Placement date:   Placement time:   Site:   Days:   Peripheral IV 08/10/18 Left Antecubital   08/10/18    1852    Antecubital   less than 1   Incision (Closed) 01/23/18 Abdomen Other (Comment)   01/23/18    1211     199          Intake/Output Last 24 hours  Intake/Output Summary (Last 24 hours) at 08/10/2018 2039 Last data filed at 08/10/2018 2000 Gross per 24 hour  Intake 500 ml  Output -  Net 500 ml    Labs/Imaging Results for orders placed or performed during the hospital encounter of 08/10/18 (from the past 48 hour(s))  Urinalysis, Routine w reflex microscopic     Status: Abnormal   Collection Time: 08/10/18  6:02 PM  Result Value Ref Range   Color, Urine YELLOW YELLOW   APPearance CLEAR CLEAR   Specific Gravity, Urine 1.019 1.005 - 1.030   pH 5.0 5.0 -  8.0   Glucose, UA NEGATIVE NEGATIVE mg/dL   Hgb urine dipstick SMALL (A) NEGATIVE   Bilirubin Urine NEGATIVE NEGATIVE   Ketones, ur NEGATIVE NEGATIVE mg/dL   Protein, ur NEGATIVE NEGATIVE mg/dL   Nitrite NEGATIVE NEGATIVE   Leukocytes,Ua TRACE (A) NEGATIVE   WBC, UA 0-5 0 - 5 WBC/hpf   Bacteria, UA NONE SEEN NONE SEEN   Mucus PRESENT     Comment: Performed at [facility] [address] [place], [place] [address]  Comprehensive metabolic panel     Status: Abnormal   Collection Time: 08/10/18  6:03 PM  Result Value Ref Range   Sodium 134 (L) 135 - 145 mmol/L   Potassium 4.1 3.5 - 5.1 mmol/L   Chloride 100 98 - 111 mmol/L   CO2 25 22 - 32 mmol/L   Glucose, Bld 104  (H) 70 - 99 mg/dL   BUN 18 8 - 23 mg/dL   Creatinine, Ser 0.63 0.44 - 1.00 mg/dL   Calcium 8.6 (L) 8.9 - 10.3 mg/dL   Total Protein 7.1 6.5 - 8.1 g/dL   Albumin 3.2 (L) 3.5 - 5.0 g/dL   AST 374 (H) 15 - 41 U/L   ALT 109 (H) 0 - 44 U/L   Alkaline Phosphatase 52 38 - 126 U/L   Total Bilirubin 0.7 0.3 - 1.2 mg/dL   GFR calc non Af Amer >60 >60 mL/min   GFR calc Af Amer >60 >60 mL/min   Anion gap 9 5 - 15    Comment: Performed at [facility], [place] [address], [place], [place] [address]  CBC with Differential     Status: Abnormal   Collection Time: 08/10/18  6:03 PM  Result Value Ref Range   WBC 6.9 4.0 - 10.5 K/uL   RBC 4.49 3.87 - 5.11 MIL/uL   Hemoglobin 15.1 (H) 12.0 - 15.0 g/dL   HCT 45.3 36.0 - 46.0 %   MCV 100.9 (H) 80.0 - 100.0 fL   MCH 33.6 26.0 - 34.0 pg   MCHC 33.3 30.0 - 36.0 g/dL   RDW 13.0 11.5 - 15.5 %   Platelets 195 150 - 400 K/uL   nRBC 0.0 0.0 - 0.2 %   Neutrophils Relative % 76 %   Neutro Abs 5.3 1.7 - 7.7 K/uL   Lymphocytes Relative 15 %   Lymphs Abs 1.0 0.7 - 4.0 K/uL   Monocytes Relative 5 %   Monocytes Absolute 0.3 0.1 - 1.0 K/uL   Eosinophils Relative 3 %   Eosinophils Absolute 0.2 0.0 - 0.5 K/uL   Basophils Relative 0 %   Basophils Absolute 0.0 0.0 - 0.1 K/uL   Immature Granulocytes 1 %   Abs Immature Granulocytes 0.04 0.00 - 0.07 K/uL    Comment: Performed at [facility] [address] [place], [place] [address]  CK     Status: Abnormal   Collection Time: 08/10/18  6:03 PM  Result Value Ref Range   Total CK 10,282 (H) 38 - 234 U/L    Comment: RESULTS CONFIRMED BY MANUAL DILUTION Performed at [facility], [place] [address] [place], [place] [address]    No results found.  Pending Labs Unresulted Labs (From admission, onward)   None      Vitals/Pain Today's Vitals   08/10/18 1645 08/10/18 1700 08/10/18 1730 08/10/18 1745  BP: 139/83 139/89 132/78 133/84  Pulse: (!) 102 96 92 93  Resp:      Temp:  TempSrc:       SpO2: 95% 97% 96% 96%  PainSc:        Isolation Precautions No active isolations  Medications Medications  sodium chloride 0.9 % bolus 500 mL (0 mLs Intravenous Stopped 08/10/18 2000)  sodium chloride 0.9 % bolus 1,000 mL (1,000 mLs Intravenous New Bag/Given 08/10/18 2004)    Mobility walks Low fall risk   Focused Assessments    R Recommendations: See Admitting Provider Note  Report given to:   Additional Notes:  Very very sweet. You will need a Optometrist.

## 2018-08-10 NOTE — ED Provider Notes (Addendum)
[facility] EMERGENCY DEPARTMENT Provider Note   CSN: [id number] Arrival date & time: 08/10/18  1517    History   Chief Complaint Chief Complaint  Patient presents with  . Rash    HPI Lisa Mcdowell is a 68 y.o. female.     The history is provided by the patient and medical records. No language interpreter was used.  Rash  Lisa Mcdowell is a 68 y.o. female who presents to the Emergency Department complaining of rash.  She developed rash and itching in April.  She has been seen by PCP(health department) twice, Derm about 6-7 times. The rash began on her arms, fingers.  About a week later the rash spread to her head.  Three to four weeks later the rash spread to her trunk.    She has joint pain, poor appetite, fatigue.  reffered to Psych. Denies fevers but she does sweat at times.  Denies CP, sob, abdominal, N/V/D, dysuria, hematuria.  She has a hx/o hyperlipidemia.    She has been treated with betamethasone, methylprednisolone, clobetasol, amitriptyline, hydroxyzine, doxepin, benadryl, tylenol with no significant improvement in sxs.  Daughter brings her in due to progressive pain, poor appetite, fatigue.   Past Medical History:  Diagnosis Date  . Cholelithiasis   . Gallstones 08/08/2010  . GERD (gastroesophageal reflux disease)    pt denies having GERD on day of surgery  . Hyperlipidemia   . Nausea & vomiting     Patient Active Problem List   Diagnosis Date Noted  . Encounter for screening colonoscopy 03/24/2015  . Essential tremor 02/06/2015  . Infectious colitis 12/16/2014  . Hyperlipidemia 10/28/2014  . GERD (gastroesophageal reflux disease) 10/28/2014  . Hemorrhoids 10/28/2014  . Gallstones 08/08/2010    Past Surgical History:  Procedure Laterality Date  . CHOLECYSTECTOMY  08/08/2010   Procedure: LAPAROSCOPIC CHOLECYSTECTOMY;  Surgeon: Scherry Ran;  [facility];  Service: General;  Laterality: N/A;  . COLONOSCOPY N/A 04/15/2015    Procedure: COLONOSCOPY;  Surgeon: Danie Binder, MD;  [facility];  Service: Endoscopy;  Laterality: N/A;  1015 - moved to 10:00 - office to notify - interpreter scheduled, do NOT change time  . INSERTION OF MESH N/A 01/23/2018   Procedure: INSERTION OF MESH;  Surgeon: Donnie Mesa, MD;  Location: [place];  Service: General;  Laterality: N/A;  . UMBILICAL HERNIA REPAIR N/A 01/23/2018   Procedure: UMBILICAL HERNIA REPAIR WITH MESH;  Surgeon: Donnie Mesa, MD;  Location: [facility];  Service: General;  Laterality: N/A;  . VARICOSE VEIN SURGERY  2011   [facility]     OB History   No obstetric history on file.      Home Medications    Prior to Admission medications   Medication Sig Start Date End Date Taking? Authorizing Provider  ezetimibe (ZETIA) 10 MG tablet Take 10 mg by mouth daily.    [provider]  hydrOXYzine (ATARAX/VISTARIL) 25 MG tablet Take 1 tablet (25 mg total) by mouth every 8 (eight) hours as needed for itching. 08/02/18   Horton, Barbette Hair, MD  methylPREDNISolone (MEDROL DOSEPAK) 4 MG TBPK tablet Take as directed on packet 08/02/18   Horton, Barbette Hair, MD  oxyCODONE (OXY IR/ROXICODONE) 5 MG immediate release tablet Take 1 tablet (5 mg total) by mouth every 6 (six) hours as needed for severe pain. 01/23/18   Donnie Mesa, MD    Family History Family History  Problem Relation Age of Onset  .  Colon cancer Neg Hx     Social History Social History   Tobacco Use  . Smoking status: Never Smoker  . Smokeless tobacco: Never Used  Substance Use Topics  . Alcohol use: No  . Drug use: No     Allergies   Patient has no known allergies.   Review of Systems Review of Systems  Skin: Positive for rash.  All other systems reviewed and are negative.    Physical Exam Updated Vital Signs BP 133/84   Pulse 93   Temp 98.7 F (37.1 C) (Oral)   Resp 20   SpO2 96%   Physical Exam Vitals signs and nursing note  reviewed.  Constitutional:      Appearance: She is well-developed.  HENT:     Head: Normocephalic and atraumatic.  Cardiovascular:     Rate and Rhythm: Normal rate and regular rhythm.     Heart sounds: No murmur.  Pulmonary:     Effort: Pulmonary effort is normal. No respiratory distress.     Breath sounds: Normal breath sounds.  Abdominal:     Palpations: Abdomen is soft.     Tenderness: There is no abdominal tenderness. There is no guarding or rebound.  Musculoskeletal:        General: No swelling or tenderness.  Skin:    General: Skin is warm and dry.     Capillary Refill: Capillary refill takes less than 2 seconds.     Comments: Extensive rash.  Large patches of erythema, scaling to hairline, ears, anterior to ears with blanching.  Patches to dorsal upper arms with erythema, excoriations and purpura.  Bilateral flanks with large erythematous plaques with local excoriation and purpura.  Spares distal arms, distal lower extremities, abdomen.    Neurological:     Mental Status: She is alert and oriented to person, place, and time.  Psychiatric:        Behavior: Behavior normal.      ED Treatments / Results  Labs (all labs ordered are listed, but only abnormal results are displayed) Labs Reviewed  COMPREHENSIVE METABOLIC PANEL - Abnormal; Notable for the following components:      Result Value   Sodium 134 (*)    Glucose, Bld 104 (*)    Calcium 8.6 (*)    Albumin 3.2 (*)    AST 374 (*)    ALT 109 (*)    All other components within normal limits  CBC WITH DIFFERENTIAL/PLATELET - Abnormal; Notable for the following components:   Hemoglobin 15.1 (*)    MCV 100.9 (*)    All other components within normal limits  URINALYSIS, ROUTINE W REFLEX MICROSCOPIC - Abnormal; Notable for the following components:   Hgb urine dipstick SMALL (*)    Leukocytes,Ua TRACE (*)    All other components within normal limits  CK - Abnormal; Notable for the following components:   Total CK  10,282 (*)    All other components within normal limits    EKG None  Radiology No results found.  Procedures Procedures (including critical care time)  Medications Ordered in ED Medications  sodium chloride 0.9 % bolus 500 mL (0 mLs Intravenous Stopped 08/10/18 2000)  sodium chloride 0.9 % bolus 1,000 mL (1,000 mLs Intravenous New Bag/Given 08/10/18 2004)     Initial Impression / Assessment and Plan / ED Course  I have reviewed the triage vital signs and the nursing notes.  Pertinent labs & imaging results that were available during my care of the patient  were reviewed by me and considered in my medical decision making (see chart for details).        Patient here for evaluation of progressive rash, poor oral intake. CK is markedly elevated. She was treated with IV fluid hydration. Medicine consulted for admission for rhabdomyolysis. Patient and daughter updated of findings of studies recommendation for admission and they are in agreement with treatment plan.  Final Clinical Impressions(s) / ED Diagnoses   Final diagnoses:  None    ED Discharge Orders    None       Quintella Reichert, MD 08/10/18 2036    Quintella Reichert, MD 08/10/18 2037

## 2018-08-10 NOTE — H&P (Addendum)
Date: 08/10/2018               Patient Name:  Lisa Mcdowell MRN: [id number]  DOB: 1950/06/29 Age / Sex: 68 y.o., female   PCP: Department, [place] Service: Internal Medicine Teaching Service         Attending Physician: Dr. Quintella Reichert, MD    First Contact: Dr. Sheppard Coil Pager: [phone]  Second Contact: Dr. Trilby Drummer Pager: [phone]       After Hours (After 5p/  First Contact Pager: [phone]  weekends / holidays): Second Contact Pager: [phone]   Chief Complaint: Rash  History of Present Illness:   Lisa Mcdowell is a 68 y/o female, with a PMHx of GERD and HLD, came to [facility] for a progressive, itchy rash. She was examined and evaluated with assistance of her daughter acting as interpreter. Her symptoms began in April 2020 when she developed an itchy rash that began with her hands and progressed up her body. She scheduled multiple appointments with her PCP and dermatologist. She had a skin biopsy that came back negative. She states that by June 2020 her rash had spread to her back, legs, and scalp. She also reports hair loss since her rash spread to her head and eyes. She also endorses that over the past three days she has progressively lost her appetite, she has difficulty walking due to progressive joint pain, pain over her rashes, and feels intermittent nausea. She has been prescribed multiple classes of topical ointments and systemic steroids with minimal improvement. She denies fever, vomiting, or chest pain.   During her ED course it was noted that her CK was 10,282 with elevated liver enzymes (AST 374, ALT 109).   Meds:  No outpatient medications have been marked as taking for the 08/10/18 encounter [facility] Encounter).     Allergies: Allergies as of 08/10/2018  . (No Known Allergies)   Past Medical History:  Diagnosis Date  . Cholelithiasis   . Gallstones 08/08/2010  . GERD (gastroesophageal reflux disease)    pt denies having GERD  on day of surgery  . Hyperlipidemia   . Nausea & vomiting     Family History:  - Lives at home with her daughter and son - eats a varied diet with lots of vegetables.   Social History:  - Denies smoking or vaping tobacco products, EtOH, and illicit drug use  Review of Systems: A complete ROS was negative except as per HPI.  Physical Exam: Blood pressure 133/84, pulse 93, temperature 98.7 F (37.1 C), temperature source Oral, resp. rate 20, SpO2 96 %. Physical Exam  Constitutional: She is well-developed, well-nourished, and in no distress. No distress.  HENT:  Head: Normocephalic and atraumatic.  Mouth/Throat: No oropharyngeal exudate.  Edema and erythematous rash noted on eyelids bilaterally, anterior surface of ears bilaterally, and along the scalp line. Patches of hair missing from her head, with loose hair noted on the bed sheets.  No ulcerations noted in the oropharynx.   Cardiovascular: Normal rate, regular rhythm, normal heart sounds and intact distal pulses. Exam reveals no gallop and no friction rub.  No murmur heard. Pulmonary/Chest: Effort normal. No respiratory distress. She exhibits no tenderness.  Crackles noted in the upper and lower R lung.  Abdominal: Soft. Bowel sounds are normal. She exhibits no distension and no mass. There is no abdominal tenderness. There is no rebound and no guarding.  Musculoskeletal:  General: No tenderness, deformity or edema.  Skin: Skin is dry. Rash noted. She is not diaphoretic. No pallor.  Erythematous rash that covers knees bilaterally and lateral aspect of the thighs bilaterally.  Blanching papules noted along the MCP joints bilaterally with rashes on the extensor surfaces of the arm, as well as the upper back.   Spares chest and abdomen.   See pictures below                Assessment & Plan by Problem: Active Problems:   * No active hospital problems. *   Assessment:  Lisa Mcdowell is a 68 y/o patient  that is being admitted for rhabdomyolysis.  Lisa Mcdowell presented to the ED for her profuse rash and was found to have elevated CK (10,282), elevated liver enzymes, and hypocalcemia (8.6). This may be secondary due to her progressive weakness, and muscle pain over her rash these past three days. She does express clinical manifestations of dermatomyositis, such as an erythematous rash which affects the hairline, , Gottron's sign, pain in her nail beds bilaterally,holster sign , and Gottron's papules. While this appears to be the primary diagnosis others should be ruled out such as polymyositis vs Lupus vs systemic sclerosis.   Rhabdomyolysis: Lisa Mcdowell presents with laboratory findings that are concerning for rhabdomyolysis. Her CK is 20,000 with elevated liver enzymes and hypocalcemia.  - She was given 1567mL NaCl 0.9% bolus in the ED - 200 mL/hour NaCl drip - Monitor CK levels Q6H  Diffuse Rash:  While her physical exam findings align with dermatomyositis, she states that her skin excision came back negative. We reached out to [facility] [phone]) for her records and were told follow up in the morning.   - Prednisone 60 mg tab PO    Dispo: Admit patient to Inpatient with expected length of stay greater than 2 midnights.  Signed: Maudie Mercury MD  08/10/2018, 8:26 PM  Pager: [phone]

## 2018-08-10 NOTE — Progress Notes (Signed)
Patient received from ED to room 6N10. Alert and oriented x4  Hispanic lady with daughter at bedside. Oriented to room and call bell. Denies pain at this time.

## 2018-08-10 NOTE — ED Triage Notes (Signed)
Pt from home w/ a c/o a rash for 4 months. One night she felt like bugs were in her bed and biting her all over. She was unable to find any bugs but then woke up with a rash on her scalp, ears, upper back, legs, and buttocks. She reports pain, burning, and itching. She has been to a dermatologist and has taken many meds for tx. Antihistamines make the symptoms worse. She has given a skin sample.

## 2018-08-11 DIAGNOSIS — R740 Nonspecific elevation of levels of transaminase and lactic acid dehydrogenase [LDH]: Secondary | ICD-10-CM | POA: Diagnosis present

## 2018-08-11 DIAGNOSIS — E119 Type 2 diabetes mellitus without complications: Secondary | ICD-10-CM

## 2018-08-11 DIAGNOSIS — L944 Gottron's papules: Secondary | ICD-10-CM | POA: Diagnosis present

## 2018-08-11 DIAGNOSIS — M3313 Other dermatomyositis without myopathy: Secondary | ICD-10-CM | POA: Diagnosis not present

## 2018-08-11 DIAGNOSIS — E785 Hyperlipidemia, unspecified: Secondary | ICD-10-CM | POA: Diagnosis present

## 2018-08-11 DIAGNOSIS — Z20828 Contact with and (suspected) exposure to other viral communicable diseases: Secondary | ICD-10-CM | POA: Diagnosis present

## 2018-08-11 DIAGNOSIS — M6282 Rhabdomyolysis: Secondary | ICD-10-CM | POA: Diagnosis present

## 2018-08-11 DIAGNOSIS — E876 Hypokalemia: Secondary | ICD-10-CM | POA: Diagnosis not present

## 2018-08-11 DIAGNOSIS — R748 Abnormal levels of other serum enzymes: Secondary | ICD-10-CM | POA: Diagnosis present

## 2018-08-11 DIAGNOSIS — E11649 Type 2 diabetes mellitus with hypoglycemia without coma: Secondary | ICD-10-CM | POA: Diagnosis not present

## 2018-08-11 DIAGNOSIS — M3310 Other dermatopolymyositis, organ involvement unspecified: Secondary | ICD-10-CM | POA: Diagnosis present

## 2018-08-11 DIAGNOSIS — M3312 Other dermatopolymyositis with myopathy: Secondary | ICD-10-CM | POA: Diagnosis present

## 2018-08-11 LAB — HEMOGLOBIN A1C
Hgb A1c MFr Bld: 6 % — ABNORMAL HIGH (ref 4.8–5.6)
Mean Plasma Glucose: 125.5 mg/dL

## 2018-08-11 LAB — COMPREHENSIVE METABOLIC PANEL
ALT: 93 U/L — ABNORMAL HIGH (ref 0–44)
AST: 318 U/L — ABNORMAL HIGH (ref 15–41)
Albumin: 2.6 g/dL — ABNORMAL LOW (ref 3.5–5.0)
Alkaline Phosphatase: 40 U/L (ref 38–126)
Anion gap: 8 (ref 5–15)
BUN: 10 mg/dL (ref 8–23)
CO2: 22 mmol/L (ref 22–32)
Calcium: 7.8 mg/dL — ABNORMAL LOW (ref 8.9–10.3)
Chloride: 107 mmol/L (ref 98–111)
Creatinine, Ser: 0.52 mg/dL (ref 0.44–1.00)
GFR calc Af Amer: >60 mL/min (ref >60)
GFR calc non Af Amer: >60 mL/min (ref >60)
Glucose, Bld: 152 mg/dL — ABNORMAL HIGH (ref 70–99)
Potassium: 3.6 mmol/L (ref 3.5–5.1)
Sodium: 137 mmol/L (ref 135–145)
Total Bilirubin: 0.6 mg/dL (ref 0.3–1.2)
Total Protein: 5.8 g/dL — ABNORMAL LOW (ref 6.5–8.1)

## 2018-08-11 LAB — GLUCOSE, CAPILLARY
Glucose-Capillary: 134 mg/dL — ABNORMAL HIGH (ref 70–99)
Glucose-Capillary: 114 mg/dL — ABNORMAL HIGH (ref 70–99)
Glucose-Capillary: 141 mg/dL — ABNORMAL HIGH (ref 70–99)
Glucose-Capillary: 179 mg/dL — ABNORMAL HIGH (ref 70–99)

## 2018-08-11 LAB — SEDIMENTATION RATE: Sed Rate: 73 mm/hr — ABNORMAL HIGH (ref 0–22)

## 2018-08-11 LAB — C-REACTIVE PROTEIN: CRP: 2.9 mg/dL — ABNORMAL HIGH (ref <1.0)

## 2018-08-11 LAB — CK
Total CK: 8763 U/L — ABNORMAL HIGH (ref 38–234)
Total CK: 10350 U/L — ABNORMAL HIGH (ref 38–234)
Total CK: 7447 U/L — ABNORMAL HIGH (ref 38–234)

## 2018-08-11 LAB — PROTIME-INR
INR: 1.1 (ref 0.8–1.2)
Prothrombin Time: 14 seconds (ref 11.4–15.2)

## 2018-08-11 LAB — TSH: TSH: 1.097 u[IU]/mL (ref 0.350–4.500)

## 2018-08-11 LAB — HIV ANTIBODY (ROUTINE TESTING W REFLEX): HIV Screen 4th Generation wRfx: NONREACTIVE

## 2018-08-11 MED ORDER — PREDNISONE 50 MG PO TABS: 70.0000 mg | ORAL_TABLET | Freq: Every day | ORAL | Status: DC

## 2018-08-11 MED ORDER — PREDNISONE 50 MG PO TABS
70.0000 mg | ORAL_TABLET | Freq: Every day | ORAL | Status: DC
  Administered 2018-08-11 – 2018-08-15 (×5): 70 mg via ORAL
  Filled 2018-08-11 (×5): qty 1

## 2018-08-11 MED ORDER — SODIUM CHLORIDE 0.9 % IV SOLN
INTRAVENOUS | Status: DC
  Administered 2018-08-11 – 2018-08-15 (×16): via INTRAVENOUS

## 2018-08-11 NOTE — Progress Notes (Signed)
Subjective: A spanish interpreter was contacted via phone to help facilitate communication. Patient soon on AM rounds today. She was examined while she was sitting in her room chair at the bedside. She recounts what brought her into the hospital. A couple of months, patient had unmitigated itchiness from a rash all over her body that felt as if animals were scratching her. She was not sure what caused her rash. In addition, she also has had an episode of losing her balance while walking. Patient is visibly distressed while discussing these events.  We discussed running test to confirm working diagnosis of dermatomyositis. Ensured patient that we are confident this treatment will help address all her issues. She notes that the pain in her joints has improved with medication. We also discussed that we are treating her with steroids and continued IV fluids.  Objective:  Vital signs in last 24 hours: Vitals:   08/10/18 1745 08/10/18 2225 08/10/18 2300 08/11/18 0511  BP: 133/84 (!) 156/89  120/78  Pulse: 93 (!) 103  69  Resp:  18  18  Temp:  98.6 F (37 C)  (!) 97.4 F (36.3 C)  TempSrc:  Oral  Oral  SpO2: 96% 100%  99%  Weight:   72.6 kg    Physical Exam Constitutional:      General: She is not in acute distress.    Appearance: Normal appearance. She is obese. She is not ill-appearing, toxic-appearing or diaphoretic.  HENT:     Head: Normocephalic and atraumatic.     Comments: Erythematous rash at the hairline    Mouth/Throat:     Pharynx: Oropharynx is clear. No oropharyngeal exudate or posterior oropharyngeal erythema.  Eyes:     General: No scleral icterus.       Right eye: No discharge.        Left eye: No discharge.     Extraocular Movements: Extraocular movements intact.  Cardiovascular:     Rate and Rhythm: Normal rate and regular rhythm.     Pulses: Normal pulses.     Heart sounds: Normal heart sounds. No murmur. No friction rub. No gallop.   Pulmonary:     Effort:  Pulmonary effort is normal. No respiratory distress.     Breath sounds: Normal breath sounds. No wheezing or rales.  Abdominal:     General: Abdomen is flat. Bowel sounds are normal. There is no distension.     Palpations: Abdomen is soft.     Tenderness: There is no abdominal tenderness. There is no guarding.  Musculoskeletal: Normal range of motion.        General: No swelling or tenderness.     Comments: Subjective LE weakness expressed by pt  Skin:    General: Skin is warm and dry.     Findings: Erythema (Dry erythematous skin lesions in the sun exposed areas of the hairline upper chest upper back and lower back. ) present.     Comments: Gottron's papules appreciated on bilateral hands.  Appears to be improved from pictures on admission  Neurological:     General: No focal deficit present.     Mental Status: She is alert and oriented to person, place, and time.  Psychiatric:        Mood and Affect: Mood normal.        Behavior: Behavior normal.        Thought Content: Thought content normal.        Judgment: Judgment normal.     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 The erythematous rash was present on her hairline, sun exposed skin areas of the upper chest and back, and lower back. She also has pathopneumonic Gottron's papules. Highly suspicious for dermatomyositis. She has initially got this rash worked up in the outpatient setting, where a skin biopsy was performed. However, we do not have access the skin biopsy results at this moment.   #Dermatomyositis: Ms. Hagin presentation of rash on the hairline, sun exposed  areas, as well as the presence of pathognomonic Gottron's papules in addition to muscle weakness and joint pain on admission is highly suspicious of dermatomyositis. She has had this worked up in the outpatient setting, where a skin biopsy was performed however we do not have access to these records at the moment.  We have reached out to the [facility] [phone]) for more information. We have left a message and are waiting for callback. - Start Prednisone at 1mg /kg dosing (70 mg) today. Will likely need this dose for 6 weeks. - F/u with [facility] for records of skin biopsy and other workup that has been done - Consider another immunomodulating agent such as methotrexate or hydroxychloroquine.  #Rhabdomyolysis: CK was 10,282 on admission, still elevated at  - She was given 1512mL NaCl 0.9% bolus in the ED - Continue NS 200 mL/hour infusion - Monitor CK levels Q6H  #Transaminitis: AST/ALT 374/109 on admission, now 318/93. 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  - Trend w/ daily BMP   #T2DM: Hgb A1c 6.0 on admission. - SSI   #FEN/GI #Hypocalcemia: 7.8 on admission, corrected level at low limit of normal 8.9 - Continue IVF as noted above  #DVT prophylaxis: Lovenox injection 40mg  daily  #Code status: Full #Dispo: Admit patient toInpatient with expected length of stay greater than 2 midnights.  Earlene Plater, MD Internal Medicine, PGY1 Pager: [phone]  08/11/2018,2:07 PM

## 2018-08-11 NOTE — Evaluation (Signed)
Occupational Therapy Evaluation Patient Details Name: Lisa Mcdowell MRN: [id number] DOB: August 07, 1950 Today's Date: 08/11/2018    History of Present Illness 68 yo spanish speaking female admitted with rash x 4 months and elevated liver enzymes. Pt admitted with Rhabdomyolysis and rule out polymositis vs lupus vs systemic sclerosis.  PMH: gallstone,umbilical hernia repair 07/3417   Clinical Impression   PT admitted with rhabdomyolysis. Pt currently with functional limitiations due to the deficits listed below (see OT problem list). Pt prior to rash independent with all adls. Pt currently min guard (A) with RW due to pain in joints and unsteady. Recommending that sandals have a heel strap to help with proper fit and decr fall risk.  Pt will benefit from skilled OT to increase their independence and safety with adls and balance to allow discharge Bath (pending progress).     Follow Up Recommendations  Home health OT    Equipment Recommendations  None recommended by OT    Recommendations for Other Services       Precautions / Restrictions Precautions Precautions: Fall Restrictions Weight Bearing Restrictions: No      Mobility Bed Mobility Overal bed mobility: Independent                Transfers Overall transfer level: Needs assistance Equipment used: Rolling walker (2 wheeled) Transfers: Sit to/from Stand Sit to Stand: Min guard         General transfer comment: pt relies on RW during transfer. pt given cues to not pick up RW.     Balance Overall balance assessment: Mild deficits observed, not formally tested                                         ADL either performed or assessed with clinical judgement   ADL Overall ADL's : Needs assistance/impaired Eating/Feeding: Independent   Grooming: Wash/dry hands;Supervision/safety Grooming Details (indicate cue type and reason): noted that patient has tremor to hands and reports this started with  rash 4 months ago Upper Body Bathing: Set up   Lower Body Bathing: Set up   Upper Body Dressing : Standing   Lower Body Dressing: Set up   Toilet Transfer: Min guard;RW           Functional mobility during ADLs: Min guard;Rolling walker General ADL Comments: pt has flip flops currently that are not well fitting and pt slides her heels out the back. patient reports daughter purchased sandals due to pain in feet. pt could benefit from a more fitted shoes. if sandals are more comfortable they need to have a strap on the heel to keep a better fit pt cleaning dentures with tooth brush at sink     Vision         Perception     Praxis      Pertinent Vitals/Pain Pain Assessment: Faces Faces Pain Scale: Hurts little more Pain Location: shoulders legs Pain Descriptors / Indicators: Discomfort Pain Intervention(s): Monitored during session;Repositioned     Hand Dominance Right   Extremity/Trunk Assessment Upper Extremity Assessment Upper Extremity Assessment: Generalized weakness   Lower Extremity Assessment Lower Extremity Assessment: Defer to PT evaluation   Cervical / Trunk Assessment Cervical / Trunk Assessment: Normal   Communication Communication Communication: Interpreter utilized   Cognition Arousal/Alertness: Awake/alert Behavior During Therapy: WFL for tasks assessed/performed Overall Cognitive Status: Within Functional Limits for tasks assessed  General Comments  noted to have tremors in hands that requires increase time due to under shooting    Exercises     Shoulder [place] expects to be discharged to:: Private residence Living Arrangements: Children;Spouse/significant other Available Help at Discharge: Family Type of Home: House Home Access: Level entry     Home Layout: One level     Bathroom Shower/Tub: Teacher, early years/pre: Standard      Home Equipment: Environmental consultant - 2 wheels;Kasandra Knudsen - single point   Additional Comments: family purchased DME because a fellow neighbor was going to throw it away. Son works but lives int he home. husband works nearby but most days patient is home alone      Prior Functioning/Environment Level of Independence: Needs Product/process development scientist / Transfers Assistance Needed: reports pain so severe not getting out of bed ADL's / Homemaking Assistance Needed: (A) with transfers to bathroom due to pain            OT Problem List: Decreased activity tolerance;Impaired balance (sitting and/or standing);Decreased safety awareness;Decreased knowledge of precautions;Decreased knowledge of use of DME or AE;Pain      OT Treatment/Interventions: Self-care/ADL training;Therapeutic exercise;Neuromuscular education;Energy conservation;DME and/or AE instruction;Manual therapy;Therapeutic activities;Patient/family education;Balance training    OT Goals(Current goals can be found in the care plan section) Acute Rehab OT Goals Patient Stated Goal: none stated this session OT Goal Formulation: With patient Time For Goal Achievement: 08/25/18 Potential to Achieve Goals: Good  OT Frequency: Min 2X/week   Barriers to D/C:            Co-evaluation              AM-PAC OT "6 Clicks" Daily Activity     Outcome Measure Help from another person eating meals?: None Help from another person taking care of personal grooming?: A Little Help from another person toileting, which includes using toliet, bedpan, or urinal?: A Little Help from another person bathing (including washing, rinsing, drying)?: A Little Help from another person to put on and taking off regular upper body clothing?: A Little Help from another person to put on and taking off regular lower body clothing?: A Little 6 Click Score: 19   End of Session Equipment Utilized During Treatment: Gait belt;Rolling walker Nurse Communication: Mobility  status;Precautions  Activity Tolerance: Patient tolerated treatment well Patient left: Other (comment)(up with PT in hall to assess balance further)  OT Visit Diagnosis: Unsteadiness on feet (R26.81)                Time: 3614-4315 OT Time Calculation (min): 21 min Charges:  OT General Charges $OT Visit: 1 Visit OT Evaluation $OT Eval Moderate Complexity: 1 Mod   Jeri Modena, OTR/L  Acute Rehabilitation Services Pager: [phone] Office: [phone] .   Jeri Modena 08/11/2018, 9:18 AM

## 2018-08-11 NOTE — Plan of Care (Signed)
  Problem: Health Behavior/Discharge Planning: Goal: Ability to manage health-related needs will improve Outcome: Progressing   

## 2018-08-11 NOTE — Evaluation (Signed)
Physical Therapy Evaluation Patient Details Name: Lisa Mcdowell MRN: [id number] DOB: 1950/12/26 Today's Date: 08/11/2018   History of Present Illness  68 yo spanish speaking female admitted with rash x 4 months and elevated liver enzymes. Pt admitted with Rhabdomyolysis and rule out polymositis vs lupus vs systemic sclerosis.  PMH: gallstone,umbilical hernia repair 0/8144  Clinical Impression  Pt admitted with above diagnosis. Pt currently with functional limitations due to the deficits listed below (see PT Problem List). Pt independent with bed mobility but balance deficits noted in standing, especially with dynamic activity. Pt ambulated 200' with RW with 3 episodes of stumbling due to catching her toe. She reports pain in thighs down to feet while walking and had 3/4 DOE by end of ambulation. Pt reports that itching that began in April is still present as well as UE intentional tremor that began at the same time.  Pt reports that symptoms came on suddenly one evening and have progressively worsened. Pt would benefit from follow up PT for balance training and strengthening. Pt will benefit from skilled PT to increase their independence and safety with mobility to allow discharge to the venue listed below.       Follow Up Recommendations Home health PT;Supervision for mobility/OOB    Equipment Recommendations  None recommended by PT    Recommendations for Other Services       Precautions / Restrictions Precautions Precautions: Fall Restrictions Weight Bearing Restrictions: No      Mobility  Bed Mobility Overal bed mobility: Independent                Transfers Overall transfer level: Needs assistance Equipment used: Rolling walker (2 wheeled) Transfers: Sit to/from Stand Sit to Stand: Min guard         General transfer comment: pt relies on RW during transfer. pt given cues to not pick up RW.   Ambulation/Gait Ambulation/Gait assistance: Min assist Gait Distance  (Feet): 200 Feet Assistive device: Rolling walker (2 wheeled) Gait Pattern/deviations: Step-through pattern;Decreased stride length Gait velocity: decreased Gait velocity interpretation: <1.8 ft/sec, indicate of risk for recurrent falls General Gait Details: 3/4 DOE by end of ambulation. stumbled 3 times with use of RW and min A to correct, low step height with frequent catching of toe. Wearing flip flop style shoes and discussed wearing of shoes with a back strap  Stairs            Wheelchair Mobility    Modified Rankin (Stroke Patients Only)       Balance Overall balance assessment: Needs assistance Sitting-balance support: No upper extremity supported Sitting balance-Leahy Scale: Normal     Standing balance support: No upper extremity supported Standing balance-Leahy Scale: Fair Standing balance comment: able to maintain static balance but when performing dynamic activity often needs to stabilize self with use of UE's                             Pertinent Vitals/Pain Pain Assessment: Faces Faces Pain Scale: Hurts little more Pain Location: shoulders legs Pain Descriptors / Indicators: Discomfort Pain Intervention(s): Limited activity within patient's tolerance;Monitored during session    Home Living Family/patient expects to be discharged to:: Private residence Living Arrangements: Children;Spouse/significant other Available Help at Discharge: Family Type of Home: House Home Access: Level entry     Home Layout: One level Home Equipment: Environmental consultant - 2 wheels;Kasandra Knudsen - single point Additional Comments: family purchased DME because a fellow neighbor was  going to throw it away. Son works but lives in  the home. husband works nearby but most days patient is home alone    Prior Function Level of Independence: Needs Water engineer / Transfers Assistance Needed: reports pain so severe not getting out of bed  ADL's / Homemaking Assistance Needed: (A) with  transfers to bathroom due to pain        Hand Dominance   Dominant Hand: Right    Extremity/Trunk Assessment   Upper Extremity Assessment Upper Extremity Assessment: Defer to OT evaluation    Lower Extremity Assessment Lower Extremity Assessment: Generalized weakness    Cervical / Trunk Assessment Cervical / Trunk Assessment: Normal  Communication   Communication: Interpreter utilized([employer]))  Cognition Arousal/Alertness: Awake/alert Behavior During Therapy: WFL for tasks assessed/performed Overall Cognitive Status: Within Functional Limits for tasks assessed                                 General Comments: somewhat tangential with speech      General Comments General comments (skin integrity, edema, etc.): temors noted B hands with fine motor activities, pt reports these began in April. She also reports that prior to that time she was completely normal and symptoms came on all of a sudden one evening with terrible itching and a feeling of animals running all over her. She reports continued full body itching despite numerous treatments    Exercises     Assessment/Plan    PT Assessment Patient needs continued PT services  PT Problem List Decreased strength;Decreased activity tolerance;Decreased balance;Decreased knowledge of use of DME;Decreased safety awareness;Decreased knowledge of precautions;Pain;Impaired sensation       PT Treatment Interventions DME instruction;Gait training;Therapeutic activities;Therapeutic exercise;Functional mobility training;Balance training;Neuromuscular re-education;Patient/family education    PT Goals (Current goals can be found in the Care Plan section)  Acute Rehab PT Goals Patient Stated Goal: find out what is causing this PT Goal Formulation: With patient Time For Goal Achievement: 08/25/18 Potential to Achieve Goals: Good Additional Goals Additional Goal #1: Pt will score >45 on [place] balance     Frequency Min 3X/week   Barriers to discharge Decreased caregiver support home alone most of the time    Co-evaluation               AM-PAC PT "6 Clicks" Mobility  Outcome Measure Help needed turning from your back to your side while in a flat bed without using bedrails?: None Help needed moving from lying on your back to sitting on the side of a flat bed without using bedrails?: None Help needed moving to and from a bed to a chair (including a wheelchair)?: A Little Help needed standing up from a chair using your arms (e.g., wheelchair or bedside chair)?: A Little Help needed to walk in hospital room?: A Little Help needed climbing 3-5 steps with a railing? : A Lot 6 Click Score: 19    End of Session Equipment Utilized During Treatment: Gait belt Activity Tolerance: Patient tolerated treatment well Patient left: in chair;with chair alarm set;with call bell/phone within reach Nurse Communication: Mobility status PT Visit Diagnosis: Unsteadiness on feet (R26.81);Muscle weakness (generalized) (M62.81)    Time: 6226-3335 PT Time Calculation (min) (ACUTE ONLY): 36 min   Charges:   PT Evaluation $PT Eval Moderate Complexity: 1 Mod          Leighton Roach, [place]  Pager [phone] Office  [place] 08/11/2018, 9:46 AM

## 2018-08-12 LAB — CBC
HCT: 34.9 % — ABNORMAL LOW (ref 36.0–46.0)
Hemoglobin: 11.6 g/dL — ABNORMAL LOW (ref 12.0–15.0)
MCH: 33.4 pg (ref 26.0–34.0)
MCHC: 33.2 g/dL (ref 30.0–36.0)
MCV: 100.6 fL — ABNORMAL HIGH (ref 80.0–100.0)
Platelets: 149 10*3/uL — ABNORMAL LOW (ref 150–400)
RBC: 3.47 MIL/uL — ABNORMAL LOW (ref 3.87–5.11)
RDW: 12.9 % (ref 11.5–15.5)
WBC: 5 10*3/uL (ref 4.0–10.5)
nRBC: 0 % (ref 0.0–0.2)

## 2018-08-12 LAB — COMPREHENSIVE METABOLIC PANEL
ALT: 89 U/L — ABNORMAL HIGH (ref 0–44)
AST: 287 U/L — ABNORMAL HIGH (ref 15–41)
Albumin: 2.4 g/dL — ABNORMAL LOW (ref 3.5–5.0)
Alkaline Phosphatase: 36 U/L — ABNORMAL LOW (ref 38–126)
Anion gap: 6 (ref 5–15)
BUN: 8 mg/dL (ref 8–23)
CO2: 23 mmol/L (ref 22–32)
Calcium: 8.1 mg/dL — ABNORMAL LOW (ref 8.9–10.3)
Chloride: 111 mmol/L (ref 98–111)
Creatinine, Ser: 0.49 mg/dL (ref 0.44–1.00)
GFR calc Af Amer: >60 mL/min (ref >60)
GFR calc non Af Amer: >60 mL/min (ref >60)
Glucose, Bld: 131 mg/dL — ABNORMAL HIGH (ref 70–99)
Potassium: 3.5 mmol/L (ref 3.5–5.1)
Sodium: 140 mmol/L (ref 135–145)
Total Bilirubin: 0.5 mg/dL (ref 0.3–1.2)
Total Protein: 5.3 g/dL — ABNORMAL LOW (ref 6.5–8.1)

## 2018-08-12 LAB — GLUCOSE, CAPILLARY
Glucose-Capillary: 117 mg/dL — ABNORMAL HIGH (ref 70–99)
Glucose-Capillary: 104 mg/dL — ABNORMAL HIGH (ref 70–99)
Glucose-Capillary: 133 mg/dL — ABNORMAL HIGH (ref 70–99)
Glucose-Capillary: 97 mg/dL (ref 70–99)

## 2018-08-12 LAB — HCV COMMENT:

## 2018-08-12 LAB — CK: Total CK: 7700 U/L — ABNORMAL HIGH (ref 38–234)

## 2018-08-12 LAB — HEPATITIS C ANTIBODY (REFLEX): HCV Ab: <0.1 s/co ratio (ref 0.0–0.9)

## 2018-08-12 MED ORDER — HYDROCORTISONE 1 % EX CREA
TOPICAL_CREAM | Freq: Four times a day (QID) | CUTANEOUS | Status: DC
  Administered 2018-08-12: 1 via TOPICAL
  Filled 2018-08-12: qty 28
  Filled 2018-08-12: qty 28.35

## 2018-08-12 NOTE — Progress Notes (Addendum)
Subjective: A spanish interpreter was contacted via phone to help facilitate communication. Patient seen at the bedside on rounds this morning.  Resting comfortably in the bedside chair.  She says that she feels much better today than she did when she came in.  The itching on her skin has decreased significantly and the pain in her joints is gone. She asked if this condition would end her life, and we reassured her that this was a very managable condition but is chronic, so she will need to be on medicine long term. She expresses that she is very thankful for the medical team.  She asked that we would call her daughter so that she can be updated.  Objective:  Vital signs in last 24 hours: Vitals:   08/11/18 1342 08/11/18 2059 08/12/18 0428 08/12/18 1537  BP: (!) 105/93 (!) 141/79 110/71 (!) 145/74  Pulse: 74 75 65 75  Resp: 19 18 16    Temp: 98.7 F (37.1 C) 97.6 F (36.4 C) 97.6 F (36.4 C) 98 F (36.7 C)  TempSrc: Oral Oral Oral Oral  SpO2: 100% 100% 99% 98%  Weight:       Physical Exam Vitals signs reviewed.  Constitutional:      General: She is not in acute distress.    Appearance: Normal appearance. She is obese. She is not ill-appearing, toxic-appearing or diaphoretic.  HENT:     Head: Normocephalic and atraumatic.  Eyes:     General: No scleral icterus.       Right eye: No discharge.        Left eye: No discharge.     Extraocular Movements: Extraocular movements intact.     Conjunctiva/sclera: Conjunctivae normal.  Cardiovascular:     Rate and Rhythm: Normal rate and regular rhythm.     Pulses: Normal pulses.     Heart sounds: Normal heart sounds. No murmur. No friction rub. No gallop.   Pulmonary:     Effort: Pulmonary effort is normal. No respiratory distress.     Breath sounds: Normal breath sounds. No wheezing or rales.  Abdominal:     General: Abdomen is flat. Bowel sounds are normal. There is no distension.     Palpations: Abdomen is soft.     Tenderness:  There is no abdominal tenderness. There is no guarding.  Musculoskeletal:        General: No swelling or deformity.     Right lower leg: No edema.     Left lower leg: No edema.  Skin:    General: Skin is warm.     Findings: Rash present.     Comments: Diffuse skin rash on sun exposed areas of chest and back decreased in size and intensity compared to yesterday.  Much improved.  Neurological:     General: No focal deficit present.     Mental Status: She is alert and oriented to person, place, and time.  Psychiatric:        Mood and Affect: Mood normal.        Behavior: Behavior normal.        Thought Content: Thought content normal.        Judgment: Judgment normal.    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 The erythematous rash was present on her hairline, sun exposed skin areas of the upper chest and back, and lower back. She also has pathopneumonic Gottron's papules. Highly suspicious for dermatomyositis. She has initially got this rash worked up in the outpatient setting, where a skin biopsy was performed. However, we do not have access the skin biopsy results at this moment.   #Dermatomyositis: Ms. Kleman presentation of rash on the hairline, sun exposed areas, as well as the presence of pathognomonic Gottron's papules in addition to muscle weakness and joint pain on admission is highly suspicious of dermatomyositis. She has had this worked up in the outpatient setting, where a skin biopsy was performed however we do not have access to these records at the moment.  We have reached out to the [facility] [phone]) for more information. We have  left a message and are waiting for callback. Of note, the patient has been seen by dermatology, Dr. Allyn Kenner, in the outpatient setting for these rashes on July 9 in [place]. Paper records indicate rash was presumed to be due to contact dermatitis and was treated with Xyzal, prednisone taper and PRN benadryl. - Prednisone at 1mg /kg dosing (70 mg). Will likely need this dose for 6 weeks. - F/u with [facility] for records of skin biopsy and other workup that has been done. Will continue to reach out to find records. - The patient will need close outpatient follow up upon discharge with either rheumatology of dermatology.   #Rhabdomyolysis: CK was 10,282 on admission, trending downward. 7,700 today. - Continue NS 200 mL/hour infusion - Monitor CK levels daily   #Transaminitis: AST/ALT 374/109 on admission, now 287/89. 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  - IVF as mentioned above - Trend w/ daily BMP   #T2DM: Hgb A1c 6.0 on admission. - SSI   #FEN/GI #Hypocalcemia: 7.8 on admission, corrected level at low limit of normal 8.9 - Continue IVF as noted above  #DVT prophylaxis: Lovenox injection 40mg  daily  #Code status: Full #Dispo:  PT recommends home health.  This is challenging given patient's uninsured status.  We will reach out to social work and case management for disposition.  Earlene Plater, MD Internal Medicine, PGY1 Pager: [phone]  08/12/2018,5:27 PM

## 2018-08-12 NOTE — Progress Notes (Signed)
Physical Therapy Treatment Patient Details Name: Lisa Mcdowell MRN: [id number] DOB: 04/11/1950 Today's Date: 08/12/2018    History of Present Illness 68 yo spanish speaking female admitted with rash x 4 months and elevated liver enzymes. Pt admitted with Rhabdomyolysis and rule out polymositis vs lupus vs systemic sclerosis.  PMH: gallstone,umbilical hernia repair 01/7614    PT Comments    Pt ambulated 500' today without AD, needed min A for occasional staggering gait but no overt LOB. HR 85 bpm, SpO2 97% on RA during ambulation. Worked on standing balance exercises with and without UE support. Pt reports less pain but continued itching today. PT will continue to follow.    Follow Up Recommendations  Home health PT;Supervision for mobility/OOB     Equipment Recommendations  None recommended by PT    Recommendations for Other Services       Precautions / Restrictions Precautions Precautions: Fall Restrictions Weight Bearing Restrictions: No    Mobility  Bed Mobility               General bed mobility comments: received in chair  Transfers Overall transfer level: Needs assistance Equipment used: None Transfers: Sit to/from Stand Sit to Stand: Supervision         General transfer comment: no physical assist needed but noted increased use of UE's on armrests  Ambulation/Gait Ambulation/Gait assistance: Min assist Gait Distance (Feet): 500 Feet Assistive device: None Gait Pattern/deviations: Step-through pattern;Decreased stride length;Staggering right;Staggering left;Drifts right/left Gait velocity: decreased Gait velocity interpretation: 1.31 - 2.62 ft/sec, indicative of limited community ambulator General Gait Details: pt requested to try without RW today, occasional stagger R and L but no full LOB and no stumbling today. Rest break at halfway vitals: SpO2 97%, HR 85 bpm   Stairs             Wheelchair Mobility    Modified Rankin (Stroke Patients  Only)       Balance Overall balance assessment: Needs assistance Sitting-balance support: No upper extremity supported Sitting balance-Leahy Scale: Normal     Standing balance support: No upper extremity supported Standing balance-Leahy Scale: Fair   Single Leg Stance - Right Leg: 2 Single Leg Stance - Left Leg: 2           High Level Balance Comments: worked on SL stance, turning in circle, tapping box on floor without crushing it (she was unable to do this), picking object up from floor, 6" fwd reach (also difficult for her), and squats with UE support x10            Cognition Arousal/Alertness: Awake/alert Behavior During Therapy: WFL for tasks assessed/performed Overall Cognitive Status: Within Functional Limits for tasks assessed                                        Exercises      General Comments General comments (skin integrity, edema, etc.): pt seemed to be feeling better today as well as being in better spirits      Pertinent Vitals/Pain Pain Assessment: No/denies pain    Home Living                      Prior Function            PT Goals (current goals can now be found in the care plan section) Acute Rehab PT Goals Patient Stated Goal: find  out what is causing this PT Goal Formulation: With patient Time For Goal Achievement: 08/25/18 Potential to Achieve Goals: Good Progress towards PT goals: Progressing toward goals    Frequency    Min 3X/week      PT Plan Current plan remains appropriate    Co-evaluation              AM-PAC PT "6 Clicks" Mobility   Outcome Measure  Help needed turning from your back to your side while in a flat bed without using bedrails?: None Help needed moving from lying on your back to sitting on the side of a flat bed without using bedrails?: None Help needed moving to and from a bed to a chair (including a wheelchair)?: A Little Help needed standing up from a chair using your  arms (e.g., wheelchair or bedside chair)?: A Little Help needed to walk in hospital room?: A Little Help needed climbing 3-5 steps with a railing? : A Little 6 Click Score: 20    End of Session Equipment Utilized During Treatment: Gait belt Activity Tolerance: Patient tolerated treatment well   Nurse Communication: Mobility status PT Visit Diagnosis: Unsteadiness on feet (R26.81);Muscle weakness (generalized) (M62.81)     Time: 4742-5956 PT Time Calculation (min) (ACUTE ONLY): 15 min  Charges:  $Gait Training: 8-22 mins                     Leighton Roach, [place]  Pager [phone] Office [place] 08/12/2018, 1:07 PM

## 2018-08-13 DIAGNOSIS — M3310 Other dermatopolymyositis, organ involvement unspecified: Secondary | ICD-10-CM

## 2018-08-13 LAB — COMPREHENSIVE METABOLIC PANEL
ALT: 95 U/L — ABNORMAL HIGH (ref 0–44)
AST: 296 U/L — ABNORMAL HIGH (ref 15–41)
Albumin: 2.3 g/dL — ABNORMAL LOW (ref 3.5–5.0)
Alkaline Phosphatase: 37 U/L — ABNORMAL LOW (ref 38–126)
Anion gap: 5 (ref 5–15)
BUN: 7 mg/dL — ABNORMAL LOW (ref 8–23)
CO2: 24 mmol/L (ref 22–32)
Calcium: 7.9 mg/dL — ABNORMAL LOW (ref 8.9–10.3)
Chloride: 111 mmol/L (ref 98–111)
Creatinine, Ser: 0.47 mg/dL (ref 0.44–1.00)
GFR calc Af Amer: >60 mL/min (ref >60)
GFR calc non Af Amer: >60 mL/min (ref >60)
Glucose, Bld: 101 mg/dL — ABNORMAL HIGH (ref 70–99)
Potassium: 3.2 mmol/L — ABNORMAL LOW (ref 3.5–5.1)
Sodium: 140 mmol/L (ref 135–145)
Total Bilirubin: 0.4 mg/dL (ref 0.3–1.2)
Total Protein: 5.2 g/dL — ABNORMAL LOW (ref 6.5–8.1)

## 2018-08-13 LAB — GLUCOSE, CAPILLARY
Glucose-Capillary: 65 mg/dL — ABNORMAL LOW (ref 70–99)
Glucose-Capillary: 69 mg/dL — ABNORMAL LOW (ref 70–99)
Glucose-Capillary: 111 mg/dL — ABNORMAL HIGH (ref 70–99)
Glucose-Capillary: 106 mg/dL — ABNORMAL HIGH (ref 70–99)
Glucose-Capillary: 148 mg/dL — ABNORMAL HIGH (ref 70–99)
Glucose-Capillary: 96 mg/dL (ref 70–99)

## 2018-08-13 LAB — CK: Total CK: 7240 U/L — ABNORMAL HIGH (ref 38–234)

## 2018-08-13 LAB — CBC
HCT: 34.5 % — ABNORMAL LOW (ref 36.0–46.0)
Hemoglobin: 11.5 g/dL — ABNORMAL LOW (ref 12.0–15.0)
MCH: 32.9 pg (ref 26.0–34.0)
MCHC: 33.3 g/dL (ref 30.0–36.0)
MCV: 98.6 fL (ref 80.0–100.0)
Platelets: 146 10*3/uL — ABNORMAL LOW (ref 150–400)
RBC: 3.5 MIL/uL — ABNORMAL LOW (ref 3.87–5.11)
RDW: 13 % (ref 11.5–15.5)
WBC: 6.9 10*3/uL (ref 4.0–10.5)
nRBC: 0 % (ref 0.0–0.2)

## 2018-08-13 MED ORDER — CLOBETASOL PROPIONATE 0.05 % EX OINT
TOPICAL_OINTMENT | Freq: Two times a day (BID) | CUTANEOUS | Status: DC
  Administered 2018-08-13: 1 via TOPICAL
  Administered 2018-08-13: 10:00:00 via TOPICAL
  Administered 2018-08-13: 1 via TOPICAL
  Administered 2018-08-14 – 2018-08-15 (×3): via TOPICAL
  Filled 2018-08-13 (×2): qty 15

## 2018-08-13 MED ORDER — GLUCOSE 40 % PO GEL
ORAL | Status: AC
  Filled 2018-08-13: qty 1

## 2018-08-13 MED ORDER — PANTOPRAZOLE SODIUM 40 MG PO TBEC
40.0000 mg | DELAYED_RELEASE_TABLET | Freq: Every day | ORAL | Status: DC
  Administered 2018-08-13 – 2018-08-15 (×3): 40 mg via ORAL
  Filled 2018-08-13 (×3): qty 1

## 2018-08-13 MED ORDER — POTASSIUM CHLORIDE CRYS ER 20 MEQ PO TBCR
40.0000 meq | EXTENDED_RELEASE_TABLET | Freq: Once | ORAL | Status: AC
  Administered 2018-08-13: 21:00:00 40 meq via ORAL
  Filled 2018-08-13: qty 2

## 2018-08-13 NOTE — Progress Notes (Signed)
Occupational Therapy Treatment Patient Details Name: Lisa Mcdowell MRN: [id number] DOB: January 27, 1950 Today's Date: 08/13/2018    History of present illness 68 yo spanish speaking female admitted with rash x 4 months and elevated liver enzymes. Pt admitted with Rhabdomyolysis and rule out polymositis vs lupus vs systemic sclerosis.  PMH: gallstone,umbilical hernia repair 02/7515   OT comments  All education is complete and patient indicates understanding.Pt is at adequate level from OT stand point to d/c home with family.   Follow Up Recommendations  Home health OT    Equipment Recommendations  None recommended by OT    Recommendations for Other Services      Precautions / Restrictions Precautions Precautions: Fall       Mobility Bed Mobility               General bed mobility comments: in chair ona rrival   Transfers Overall transfer level: Modified independent                    Balance                                           ADL either performed or assessed with clinical judgement   ADL Overall ADL's : Modified independent                                       General ADL Comments: completed shower simulation . edcuated on family(A) upon d/c. pt has hand held shower head but no seat. pt don doff shoes. completed toilet transfer and sink level grooming     Vision       Perception     Praxis      Cognition Arousal/Alertness: Awake/alert Behavior During Therapy: WFL for tasks assessed/performed Overall Cognitive Status: Within Functional Limits for tasks assessed                                          Exercises     Shoulder Instructions       General Comments      Pertinent Vitals/ Pain       Pain Assessment: No/denies pain  Home Living                                          Prior Functioning/Environment              Frequency  Min 2X/week        Progress Toward Goals  OT Goals(current goals can now be found in the care plan section)  Progress towards OT goals: Goals met/education completed, patient discharged from OT  Acute Rehab OT Goals Patient Stated Goal: thank you for helping  Time For Goal Achievement: 08/25/18  Plan All goals met and education completed, patient discharged from OT services    Co-evaluation                 AM-PAC OT "6 Clicks" Daily Activity     Outcome Measure   Help from another person eating meals?: None Help from another person taking care of personal  grooming?: None Help from another person toileting, which includes using toliet, bedpan, or urinal?: None Help from another person bathing (including washing, rinsing, drying)?: None Help from another person to put on and taking off regular upper body clothing?: None Help from another person to put on and taking off regular lower body clothing?: None 6 Click Score: 24    End of Session Equipment Utilized During Treatment: Rolling walker  OT Visit Diagnosis: Unsteadiness on feet (R26.81)   Activity Tolerance Patient tolerated treatment well   Patient Left in chair;with call bell/phone within reach;with chair alarm set   Nurse Communication Mobility status;Precautions        Time: 1241-1300 OT Time Calculation (min): 19 min  Charges: OT General Charges $OT Visit: 1 Visit OT Treatments $Self Care/Home Management : 8-22 mins   Jeri Modena, OTR/L  Acute Rehabilitation Services Pager: [phone] Office: [phone] .    Jeri Modena 08/13/2018, 1:30 PM

## 2018-08-13 NOTE — Progress Notes (Addendum)
Inpatient Diabetes Program Recommendations  AACE/ADA: New Consensus Statement on Inpatient Glycemic Control (2015)  Target Ranges:  Prepandial:   less than 140 mg/dL      Peak postprandial:   less than 180 mg/dL (1-2 hours)      Critically ill patients:  140 - 180 mg/dL   Lab Results  Component Value Date   GLUCAP 106 (H) 08/13/2018   HGBA1C 6.0 (H) 08/11/2018    Review of Glycemic Control Results for Lisa Mcdowell, Lisa Mcdowell (MRN [id number]) as of 08/13/2018 14:30  Ref. Range 08/12/2018 22:35 08/13/2018 07:58 08/13/2018 08:18 08/13/2018 08:44 08/13/2018 11:46  Glucose-Capillary Latest Ref Range: 70 - 99 mg/dL 97 65 (L) 69 (L) 111 (H) 106 (H)   Diabetes history: no history noted Outpatient Diabetes medications: none Current orders for Inpatient glycemic control: Novolog 0-15 units TID, Novolog 0-5 units QHS Prednisone 70 mg QAM Inpatient Diabetes Program Recommendations:    Received consult regarding steroid use and insulin per sliding scale for discharge. Of note, patient is not on insulin at home.   Consider carb modified diet.  Patient experienced a mild hypoglycemic event of 65 mg/dL following 2 units of Novolog the night prior.  Given current trends while inpatient, all within inpatient goals; post prandial at lunch time was 106 mg/dL.  In preparation for discharge, would recommend patient obtain a meter through [employer] and check CBGs 3 times per day and have close outpatient follow up. At this time, feel that sliding scale may pose a greater risk of hypoglycemia compared to the benefit. MD text paged.  Will plan to see patient to discuss diabetes further and ensure patient knows how to perform CBG checks.   Spoke with patient at bedside using an interpreter.  Reviewed patient's current A1c of 6%. Explained what a A1c is and what it measures. Also reviewed goal A1c with patient, importance of good glucose control @ home, and blood sugar goals. Reviewed patho of DM, role of pancreas,  impact of steroids to blood sugars, vascular changes and co-morbidies.  Reviewed in detail target goals, signs and symptoms of hypo vs hyper glycemia and interventions. Reviewed inpatient insulin needs.  Patient reports trying to eat healthy, denies drinking juice or eating large portions of carbohydrates.  Provided patient with instructions for checking blood sugars, [employer] and reviewed when to call MD and what to purchase at discharge.  Have paged on call MD in regards to current insulin needs and plan for discharge. After meeting with patient, do not feel sliding scale insulin would be the safest option for discharge. MD in agreement.  Thanks, Bronson Curb, MSN, RNC-OB Diabetes Coordinator [phone] (8a-5p)

## 2018-08-13 NOTE — Progress Notes (Signed)
Hypoglycemic Event  CBG: 65    Treatment: 8 oz juice/soda  Symptoms: None  Follow-up CBG: Time:0818, 0844 CBG Result:69, 111  Possible Reasons for Event: Unknown  Comments/MD notified:paged MD awaiting call back    Lisa Mcdowell

## 2018-08-13 NOTE — Plan of Care (Signed)
  Problem: Activity: Goal: Risk for activity intolerance will decrease Outcome: Progressing   Problem: Coping: Goal: Level of anxiety will decrease Outcome: Completed/Met   Problem: Elimination: Goal: Will not experience complications related to bowel motility Outcome: Progressing Goal: Will not experience complications related to urinary retention Outcome: Progressing   Problem: Pain Managment: Goal: General experience of comfort will improve Outcome: Progressing   Problem: Safety: Goal: Ability to remain free from injury will improve Outcome: Progressing   Problem: Skin Integrity: Goal: Risk for impaired skin integrity will decrease Outcome: Not Progressing

## 2018-08-13 NOTE — Progress Notes (Signed)
MD on call informed that patient is having itching and that the Hydrocortisone cream was not effective. Arthor Captain LPN

## 2018-08-13 NOTE — Progress Notes (Signed)
Subjective: Interpretor is used by telephone.  Patient has sadness in her voice , she has suffered with the pain and itching from the rash for sometime. She feels her feet and ankles are swollen and painful which isn't new. Team shares her diagnosis is called dermatomyositis and it should get better with steroids she is being given and therapy. Blood test show she is improving. I expect this will take several weeks to months for full recoevery. Reports her rashes are improving, not itching, but still red. She shows her rash on her bottom to the team. She would like to call her daughter now because she can not tell us who did her biopsy. The phone is given to Lisa Mcdowell who reports dermatology office in [place] is who did the biopsy. She was also seen by Lisa Mcdowell, at [facility] .  Objective:  Vital signs in last 24 hours: Vitals:   08/12/18 0428 08/12/18 1537 08/12/18 2026 08/13/18 0412  BP: 110/71 (!) 145/74 138/79 132/76  Pulse: 65 75 80 84  Resp: 16  18 18   Temp: 97.6 F (36.4 C) 98 F (36.7 C) 98.2 F (36.8 C) 98.5 F (36.9 C)  TempSrc: Oral Oral Oral Oral  SpO2: 99% 98% 100% 99%  Weight:       Physical Exam Vitals signs reviewed.  Constitutional:      General: She is not in acute distress.    Appearance: Normal appearance. She is obese. She is not ill-appearing, toxic-appearing or diaphoretic.  HENT:     Head: Normocephalic and atraumatic.  Eyes:     General: No scleral icterus.       Right eye: No discharge.        Left eye: No discharge.     Extraocular Movements: Extraocular movements intact.     Conjunctiva/sclera: Conjunctivae normal.  Cardiovascular:     Rate and Rhythm: Normal rate and regular rhythm.     Pulses: Normal pulses.     Heart sounds: Normal heart sounds. No murmur. No friction rub. No gallop.   Pulmonary:     Effort: Pulmonary effort is normal. No respiratory distress.     Breath sounds: Normal breath sounds. No wheezing or rales.    Abdominal:     General: Abdomen is flat. Bowel sounds are normal. There is no distension.     Palpations: Abdomen is soft.     Tenderness: There is no abdominal tenderness. There is no guarding.  Musculoskeletal:        General: No swelling.     Right lower leg: No edema.     Left lower leg: No edema.  Skin:    General: Skin is warm.     Coloration: Skin is not jaundiced.     Comments: Rashes improved overall. Erythematous rash on lower back and flanks.  Neurological:     General: No focal deficit present.     Mental Status: She is alert and oriented to person, place, and time.     Cranial Nerves: No cranial nerve deficit.  Psychiatric:        Mood and Affect: Mood normal.        Behavior: Behavior normal.        Thought Content: Thought content normal.        Judgment: Judgment normal.    Assessment/Plan:  Principal Problem:   Secondary rhabdomyolysis Active Problems:   Adult type dermatomyositis (HCC)   Elevated CK  In summary,Lisa Mcdowell is a 35 y/oF with  a hx of GERD, and HLD who presented to the ED with progressive lower extremity weakness, muscle pain, diffuse itchy rash, elevated CK, tramnsaminitis and hypocalcemia (8.6). The erythematous rash was present on her hairline, sun exposed skin areas of the upper chest and back, and lower back. She also haspathopneumonic Gottron's papules.Highly suspicious for dermatomyositis.She has initially got this rashworked up in the outpatient setting, where a skin biopsy was performed. However,we do not have accessthe skin biopsy resultsat this moment.   #Dermatomyositis: Lisa Mcdowell presentation of rash on the hairline, sun exposed areas, as well as the presence ofpathognomonic Gottron's papules in addition to muscle weakness and joint pain on admission is highly suspicious of dermatomyositis. She has had this worked up in the outpatient setting, where a skin biopsy was performed however we do not have access to these  recordsat the moment.We have reached out to [facility] [phone]) for more information, but they do not have her on records. We have also called [facility] in [place] for more info about the rash as well. Of note, the patient has been seen by dermatology, Lisa Mcdowell, in the outpatient setting for these rashes on July 9 in [place]. Paper records indicate rash was presumed to be due to contact dermatitis and was treated with Xyzal, prednisone taper and PRN benadryl. -Prednisoneat 1mg /kg dosing (70mg ).Will likely need this dose for 6 weeks. - Spoke with Lisa Mcdowell at [facility] for records of skin biopsy, and nothing is on file. He said he and his nurse will try to find records and will call if something comes up.   - The patient will need close outpatient follow up upon discharge with either rheumatology of dermatology.  - Started Protonix 40 mg daily since she will be on steroids for long term  #Rhabdomyolysis: CKwas 10,282 on admission, trending downward. 7,240 today. -Continue NS200 mL/hourinfusion - Monitor CK levels daily   #Transaminitis: AST/ALT 374/109 on admission, now 296/95. 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  - IVF as mentioned above - Trend w/ daily BMP   #T2DM: Hgb A1c 6.0 on admission. - SSI - Will need close f/u in outpatient setting for diabetes management, especially that we expect her to be on long term steroids  - Diabetes counseling  #FEN/GI #Hypocalcemia: 7.8 on admission, corrected level at low limit of normal 8.9 - Continue IVF as noted above  #DVT prophylaxis: Lovenox injection 40mg  daily  #Code status: Full #Dispo:  PT recommends home health. This is challenging given patient's uninsured status.  We will reach out to social work and case management for disposition.  Lisa Plater, MD Internal Medicine, PGY1 Pager: [phone]  08/13/2018,1:44 PM

## 2018-08-14 LAB — GLUCOSE, CAPILLARY
Glucose-Capillary: 57 mg/dL — ABNORMAL LOW (ref 70–99)
Glucose-Capillary: 89 mg/dL (ref 70–99)
Glucose-Capillary: 128 mg/dL — ABNORMAL HIGH (ref 70–99)
Glucose-Capillary: 177 mg/dL — ABNORMAL HIGH (ref 70–99)
Glucose-Capillary: 99 mg/dL (ref 70–99)

## 2018-08-14 LAB — COMPREHENSIVE METABOLIC PANEL
ALT: 118 U/L — ABNORMAL HIGH (ref 0–44)
AST: 345 U/L — ABNORMAL HIGH (ref 15–41)
Albumin: 2.5 g/dL — ABNORMAL LOW (ref 3.5–5.0)
Alkaline Phosphatase: 40 U/L (ref 38–126)
Anion gap: 6 (ref 5–15)
BUN: 5 mg/dL — ABNORMAL LOW (ref 8–23)
CO2: 26 mmol/L (ref 22–32)
Calcium: 8.1 mg/dL — ABNORMAL LOW (ref 8.9–10.3)
Chloride: 109 mmol/L (ref 98–111)
Creatinine, Ser: 0.51 mg/dL (ref 0.44–1.00)
GFR calc Af Amer: >60 mL/min (ref >60)
GFR calc non Af Amer: >60 mL/min (ref >60)
Glucose, Bld: 84 mg/dL (ref 70–99)
Potassium: 3.6 mmol/L (ref 3.5–5.1)
Sodium: 141 mmol/L (ref 135–145)
Total Bilirubin: 0.7 mg/dL (ref 0.3–1.2)
Total Protein: 5.5 g/dL — ABNORMAL LOW (ref 6.5–8.1)

## 2018-08-14 LAB — CBC
HCT: 37.1 % (ref 36.0–46.0)
Hemoglobin: 12.4 g/dL (ref 12.0–15.0)
MCH: 32.8 pg (ref 26.0–34.0)
MCHC: 33.4 g/dL (ref 30.0–36.0)
MCV: 98.1 fL (ref 80.0–100.0)
Platelets: 180 10*3/uL (ref 150–400)
RBC: 3.78 MIL/uL — ABNORMAL LOW (ref 3.87–5.11)
RDW: 13.2 % (ref 11.5–15.5)
WBC: 6.9 10*3/uL (ref 4.0–10.5)
nRBC: 0 % (ref 0.0–0.2)

## 2018-08-14 LAB — CK: Total CK: 5744 U/L — ABNORMAL HIGH (ref 38–234)

## 2018-08-14 MED ORDER — ALENDRONATE SODIUM 10 MG PO TABS: 10.0000 mg | ORAL_TABLET | Freq: Every day | ORAL | Status: DC

## 2018-08-14 MED ORDER — HYDROCORTISONE 1 % EX CREA
TOPICAL_CREAM | Freq: Every evening | CUTANEOUS | Status: DC | PRN
  Filled 2018-08-14 (×2): qty 28

## 2018-08-14 NOTE — Progress Notes (Signed)
Patient CBG: 57; Hypoglycemic protocol initiated. Patient is taking oral fluids was given apple juice. CBG rechecked

## 2018-08-14 NOTE — Progress Notes (Signed)
Tremors are note on neuro assessment which is new onset. Rash has appeared on hands and upper back and legs. Arthor Captain LPN

## 2018-08-14 NOTE — Discharge Instructions (Signed)
Contar carbohidratos y la diabetes  Por qu es importante el conteo de carbohidratos?   Contar las porciones de carbohidratos ayuda a Freight forwarder nivel de glucosa (azcar) en su sangre para que se sienta mejor.   El equilibrio [employer] carbohidratos que come y Best boy determina el nivel de glucosa que tendr en la sangre despus de comer.   Contar carbohidratos tambin le ayudar a planificar sus comidas.   Qu alimentos contienen carbohidratos?  Entre los alimentos con carbohidratos se incluyen:   Panes, galletas saladas y cereales   Pastas, arroz y Forensic psychologist (verduras) con almidn, como papas, elote (maz o Therapist, music) y Facilities manager (guisantes o arvejas)   Optometrist (habichuelas) y legumbres   [place], [place] de soya y Estate agent   Frutas y jugos de fruta   Dulces como pasteles, Administrator, helados, mermeladas y Geologist, engineering   Porciones de carbohidratos  Al planificar comidas para la diabetes, recuerde que un alimento con 1 porcin de carbohidratos contiene aproximadamente 15 gramos de carbohidratos:   Revise el tamao de las porciones con tazas y cucharas de medir o con una pesa de alimentos.   Lea los Datos de Nutricin en las etiquetas de los alimentos para saber cuntos gramos de carbohidratos contienen los alimentos que come.   Los [employer] de este folleto muestran porciones que contienen cerca de 15 gramos de carbohidratos. Copyright Academy of Nutrition and Dietetics Consejos para planificar sus comidas   Un Plan de Alimentacin indica cuntas porciones de carbohidratos consumir en sus comidas y refrigerios (snacks). Para muchos adultos es adecuado comer 3 a 5 porciones de carbohidratos en cada comida y de 1 a 2 porciones de carbohidratos, en cada refrigerio.   En un Plan de Alimentacin diaria saludable, la mayora de los carbohidratos provienen de:  o Al menos 6 porciones de frutas y vegetales sin almidn  o Al menos 6 porciones de Engineer, manufacturing, frijoles y  Photographer con almidn, con al menos 3 de estas porciones de granos integrales (enteros)  o Al menos 2 porciones de Air traffic controller o productos lcteos   Revise regularmente su nivel de glucosa en la sangre. Esto puede indicarle si necesita ajustar las horas a las que consume carbohidratos.   Comer alimentos que contienen [place], como granos [place], y comer muy pocos alimentos salados es bueno para su salud.   Coma 4 a 6 onzas de carne u otros alimentos con protenas (como hamburguesas de soya) cada da. Elija fuentes de protena bajas en grasa, como carne de res y de cerdo bajas en grasa, pollo, pescado, queso bajo en grasa o alimentos vegetarianos como la soya.   Coma algunas grasas saludables, como aceite de [place], de canola y nueces.   Coma muy pocas grasas saturadas. Estas grasas no son saludables y se Occupational psychologist, la crema y las carnes con mucha grasa, como el tocino (tocineta) y las salchichas o Photographer.   Coma muy pocas o nada de grasas trans. Estas grasas no son saludables y se encuentran en todos los alimentos que contienen aceites parcialmente hidrogenados en su lista de ingredientes.   Consejos para leer etiquetas  En los Datos de Nutricin de las etiquetas aparece una lista con el total de gramos de carbohidratos en una porcin estndar. La porcin estndar puede ser mayor o menor que 1 porcin de carbohidratos. Para saber cuntas porciones de carbohidratos hay en un alimento:   Primero mire el tamao de la porcin estndar de la [place].  Luego verifique el total de gramos de carbohidratos. Esta es la cantidad de carbohidratos en 1 porcin estndar. Divida el total de gramos de carbohidratos por 15. Este nmero equivale al nmero de porciones de carbohidratos en 1 porcin estndar. Recuerde: 1 porcin de carbohidratos equivale a 15 gramos de carbohidratos.   Nota: Puede ignorar los gramos de [employer] Datos de Nutricin, ya que estn incluidos en el total de  gramos de carbohidratos.   Listas de alimentos para el conteo de carbohidratos  1 porcin = cerca de 15 gramos de carbohidratos  Almidones   1 rebanada de pan (1 onza)   1 tortilla (6 pulgadas)    rosca de pan (bagel) grande (1 onza)   2 tortillas para taco (5 pulgadas)    pan para hamburguesa o para salchicha (hot dog) (3/4 onza)    taza de cereal listo para comer sin endulzar    taza de cereal cocido   1 taza de sopa a base de caldo   4-6 galletitas saladas   ? taza de pasta o arroz (cocidos)    taza de frijoles, chcharos, granos de elote, camotes (batatas, boniatos), calabaza (zapallo), pur de papas o papas hervidas (cocidos)    papa grande asada (3 onzas)    onza de pretzels, papitas o totopos (tortilla chips)   3 tazas de palomitas de maz (popcorn) (ya preparadas)   Frutas   1 fruta fresca pequea ( a 1 taza)    taza de fruta enlatada o congelada   17 uvas pequeas (3 onzas)   1 taza de meln, bayas (moras)    vaso de jugo de fruta   2 cucharadas de frutas secas (arndanos azules/blueberries, cerezas, arndanos rojos/cranberries, frutas surtidas, uvas pasas/pasitas)  [place]   1 taza de [employer] o reducida en grasa   1 taza de leche de soya   ? taza de yogur descremado endulzado con un edulcorante sin azcar (6 onzas)   Dulces y postres   pastel cuadrado de 2 pulgadas (sin betn/cobertura)   2 galletitas dulces (? onzas)    taza de helado o yogur congelado    taza de sorbete (sherbet) o nieve (sorbet)   1 cucharada de jarabe (sirope), mermelada, jalea, azcar o miel   2 cucharadas de jarabe bajo en caloras   Otros alimentos   Cuente 1 taza de vegetales crudos o  taza de vegetales sin almidn, cocidas, como porciones de alimentos con cero (0) carbohidratos o sin restriccin. Si come 3 o ms porciones en una comida, cuntelas como 1 porcin de carbohidratos.   Los alimentos que contienen menos de 20 caloras en cada  porcin tambin pueden contarse como porciones con cero carbohidratos o alimentos sin restriccin.   Cuente 1 taza de guiso (estofado) u otros alimentos combinados como 2 porciones de carbohidratos.    Contar carbohidratos y la diabetes: Ejemplo de men para 1 da [place]  1 pltano/banana pequeo (1 carbohidrato)   taza de hojuelas de maz (cornflakes) (1 carbohidrato)  1 taza de leche descremada o baja en grasa (1 carbohidrato)  1 rebanada de pan de trigo integral (1 carbohidrato)  1 cucharadita de margarina   [place]  2 onzas de rebanadas de [place]  2 rebanadas de pan de trigo integral (2 carbohidratos)  2 hojas de Acupuncturist  4 palitos de apio  4 palitos de zanahoria  1 manzana mediana (1 carbohidrato)  1 taza de leche descremada o baja en grasa (1 carbohidrato)   Refrigerio  2 cucharadas de uvas pasas/pasitas (1 carbohidrato)   onzas de mini pretzels sin sal (1 carbohidrato)   [place]  3 onzas de carne asada de res, magra   papa grande asada (2 carbohidratos)  1 cucharada de crema agria reducida en grasa   taza de ejotes/habichuelas verdes/chauchas  1 taza de ensalada de vegetales  1 cucharada de aderezo para ensaladas reducido en caloras  1 panecillo de trigo integral (1 carbohidrato)  1 cucharadita de margarina  1 taza de bolitas de meln (1 carbohidrato)   Refrigerio  6 onzas de yogur de frutas bajo en grasa, sin azcar (1 carbohidrato)  2 cucharadas de nueces sin sal

## 2018-08-14 NOTE — Progress Notes (Signed)
PHARMACIST - PHYSICIAN COMMUNICATION  CONCERNING: P&T Medication Policy Regarding Oral Bisphosphonates  RECOMMENDATION: Your order for alendronate (Fosamax), ibandronate (Boniva), or risedronate (Actonel) has been discontinued at this time.  If the patient's post-hospital medical condition warrants safe use of this class of drugs, please resume the pre-hospital regimen upon discharge.  DESCRIPTION:  Alendronate (Fosamax), ibandronate (Boniva), and risedronate (Actonel) can cause severe esophageal erosions in patients who are unable to remain upright at least 30 minutes after taking this medication.   Since brief interruptions in therapy are thought to have minimal impact on bone mineral density, the Pharmacy & Therapeutics Committee has established that bisphosphonate orders should be routinely discontinued during hospitalization.   To override this safety policy and permit administration of Boniva, Fosamax, or Actonel in the hospital, prescribers must write "DO NOT HOLD" in the comments section when placing the order for this class of medications.  Ashanti Ratti S. Kaylla Cobos, PharmD, BCPS Clinical Staff Pharmacist  

## 2018-08-14 NOTE — Progress Notes (Signed)
Inpatient Diabetes Program Recommendations  AACE/ADA: New Consensus Statement on Inpatient Glycemic Control (2015)  Target Ranges:  Prepandial:   less than 140 mg/dL      Peak postprandial:   less than 180 mg/dL (1-2 hours)      Critically ill patients:  140 - 180 mg/dL   Lab Results  Component Value Date   GLUCAP 89 08/14/2018   HGBA1C 6.0 (H) 08/11/2018    Review of Glycemic Control Results for PSALM, ARMAN (MRN [id number]) as of 08/14/2018 11:07  Ref. Range 08/13/2018 16:45 08/13/2018 21:53 08/14/2018 07:29 08/14/2018 07:54  Glucose-Capillary Latest Ref Range: 70 - 99 mg/dL 148 (H) 96 57 (L) 89   Diabetes history: no history noted Outpatient Diabetes medications: none Current orders for Inpatient glycemic control: Novolog 0-15 units TID, Novolog 0-5 units QHS Prednisone 70 mg QAM  Inpatient Diabetes Program Recommendations:      Noted hypoglycemic event this Am.  Consider discontinuing Novolog correction as patient continues to have hypoglycemia, even while in presence of steroids.   Additionally, consider carb modified diet.   Thanks, Bronson Curb, MSN, RNC-OB Diabetes Coordinator [phone] (8a-5p)

## 2018-08-14 NOTE — Progress Notes (Addendum)
Subjective: Patient was seen on morning rounds today. Did have asymptomatic episode of hypoglycemia this AM. Was given juice and resolved. Sitting up in the chair at the bedside.  She says she feels much better today.  She continues to have feelings of heaviness in her feet, however this improves when she gets up and walks around.  She also notes that she has some minor itchiness before going to sleep on her rash sites.  We offered anti-itch cream she she can apply before going to sleep. She is amendable to this.  Objective:  Vital signs in last 24 hours: Vitals:   08/13/18 1400 08/13/18 2158 08/14/18 0508 08/14/18 1327  BP: 120/78 138/65 (!) 151/82 139/83  Pulse: 82 71 90 92  Resp: 17 16 16    Temp: 98.4 F (36.9 C) 98.7 F (37.1 C) 98.4 F (36.9 C) (!) 97.4 F (36.3 C)  TempSrc: Oral Oral Oral Oral  SpO2: 100% 100% 100% 97%  Weight:       Physical Exam Constitutional:      General: She is not in acute distress.    Appearance: Normal appearance. She is obese. She is not ill-appearing, toxic-appearing or diaphoretic.  HENT:     Head: Normocephalic and atraumatic.  Eyes:     General: No scleral icterus.       Right eye: No discharge.        Left eye: No discharge.     Extraocular Movements: Extraocular movements intact.  Cardiovascular:     Rate and Rhythm: Normal rate and regular rhythm.     Pulses: Normal pulses.     Heart sounds: Normal heart sounds. No murmur. No friction rub. No gallop.   Pulmonary:     Effort: Pulmonary effort is normal. No respiratory distress.     Breath sounds: Normal breath sounds. No wheezing or rales.  Abdominal:     General: Abdomen is flat. Bowel sounds are normal. There is no distension.     Palpations: Abdomen is soft.     Tenderness: There is no abdominal tenderness. There is no right CVA tenderness or guarding.  Musculoskeletal:        General: No tenderness, deformity or signs of injury.     Right lower leg: No edema.     Left lower  leg: No edema.     Comments: Minor swelling of the RLE compared to L  Skin:    General: Skin is warm.     Coloration: Skin is not jaundiced.     Findings: Erythema (erythematous rash on bilateral lower back and flank. appears improved to previous examinations) present.  Neurological:     General: No focal deficit present.     Mental Status: She is alert and oriented to person, place, and time. Mental status is at baseline.  Psychiatric:        Mood and Affect: Mood normal.        Behavior: Behavior normal.        Thought Content: Thought content normal.        Judgment: Judgment normal.    Assessment/Plan:  Principal Problem:   Secondary rhabdomyolysis Active Problems:   Adult type dermatomyositis (HCC)   Elevated CK  In summary,Lisa Mcdowell is a 57 y/oF with a hx of GERD, and HLD who presented to the ED with progressive lower extremity weakness, muscle pain, diffuse itchy rash, elevated CK, tramnsaminitis and hypocalcemia (8.6). The erythematous rash was present on her hairline, sun exposed skin areas of  the upper chest and back, and lower back. She also haspathopneumonic Gottron's papules.Highly suspicious for dermatomyositis.   #Dermatomyositis: Ms. Mikrut presentation of rash on the hairline, sun exposed areas, as well as the presence ofpathognomonic Gottron's papules in addition to muscle weakness and joint pain on admission is highly suspicious of dermatomyositis. She has had this worked up in the outpatient setting, where a skin biopsy was performed in April 2020. Dr. Collene Mares from [facility] recited the biopsy results with dermatology was spongiotic dermatitis. She was diagnosed with contact dermatitis and treated with Xyzal, prednisone taper and PRN benadryl with minimal relief at that time.  -Continue Prednisoneat 1mg /kg dosing (70mg ).Will likely need this dose for 6 weeks. - Started Protonix 40 mg daily since she will be on steroids for long term -  Alendronate 10 mg daily  - In the setting of future prolonged steroid use and postmenopausal age we decided to initiate bisphosphonate therapy in attempt to decrease risk of skeletal injury in the future: particularly lumbar spine, hip fractures, an osteonecrosis of the femoral head - Hydrocortisone cream nightly PRN for itching  -The patient will need close outpatient follow up upon discharge with either rheumatology of dermatology.  #Rhabdomyolysis: CKwas 10,282 on admission,trending downward. 5,744 today. -Continue NS200 mL/hourinfusion - Monitor CK levelsdaily  #Transaminitis: AST/ALT 374/109 on admission, now 345/118. [id number] yesterday . 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  - IVF as mentioned above - Trend w/ daily BMP  - Consider GGT if AST/ALT continue to start trending upward. Low suspicion for hepatic injury at the moment.    #T2DM: Hgb A1c 6.0 on admission. - SSI - Will need close f/u in outpatient setting for diabetes management, especially that we expect her to be on long term steroids  - Diabetes counseling  #FEN/GI #Hypocalcemia: 7.8 on admission, corrected level elevated at 11 - Continue IVF as noted above - Monitor with CMP daily  #DVT prophylaxis: Lovenox injection 40mg  daily  #Code status: Full #Dispo:PT/OT recommends home health. This is challenging given patient's uninsured status. We will reach out to social work and case management for disposition.  Earlene Plater, MD Internal Medicine, PGY1 Pager: [phone]  08/14/2018,3:54 PM

## 2018-08-14 NOTE — Progress Notes (Signed)
Nutrition Brief Note RD working remotely.  RD consulted for diet education. Per chart review, Spanish interpretor needed. RD contacted interpretor services who telephoned patient room with RD on line. Patient did not answer phone on 2 occasions. RD to include education handout in discharge summary and mail a copy to the address listed for the patient.   Wt Readings from Last 15 Encounters:  08/10/18 72.6 kg  08/01/18 72.6 kg  01/23/18 75 kg  12/12/17 72.6 kg  06/08/15 76.2 kg  04/11/15 74.1 kg  03/24/15 74.3 kg  02/28/15 75.7 kg  01/27/15 73.5 kg  12/22/14 74.4 kg  12/16/14 74.8 kg  10/28/14 74.2 kg  07/02/14 74.8 kg  01/26/13 76.2 kg  08/08/10 64.9 kg    Body mass index is 25.82 kg/m. Patient meets criteria for overweight based on current BMI.   Current diet order is overweight, patient is consuming approximately 83% of meals at this time. Labs and medications reviewed.   No nutrition interventions warranted at this time. If nutrition issues arise, please consult RD.   Lajuan Lines, RD, LDN  After Hours/Weekend Pager: [phone]

## 2018-08-15 DIAGNOSIS — E876 Hypokalemia: Secondary | ICD-10-CM

## 2018-08-15 DIAGNOSIS — R7303 Prediabetes: Secondary | ICD-10-CM

## 2018-08-15 LAB — COMPREHENSIVE METABOLIC PANEL
ALT: 122 U/L — ABNORMAL HIGH (ref 0–44)
AST: 346 U/L — ABNORMAL HIGH (ref 15–41)
Albumin: 2.4 g/dL — ABNORMAL LOW (ref 3.5–5.0)
Alkaline Phosphatase: 38 U/L (ref 38–126)
Anion gap: 9 (ref 5–15)
BUN: 7 mg/dL — ABNORMAL LOW (ref 8–23)
CO2: 25 mmol/L (ref 22–32)
Calcium: 8.2 mg/dL — ABNORMAL LOW (ref 8.9–10.3)
Chloride: 104 mmol/L (ref 98–111)
Creatinine, Ser: 0.42 mg/dL — ABNORMAL LOW (ref 0.44–1.00)
GFR calc Af Amer: >60 mL/min (ref >60)
GFR calc non Af Amer: >60 mL/min (ref >60)
Glucose, Bld: 88 mg/dL (ref 70–99)
Potassium: 3.1 mmol/L — ABNORMAL LOW (ref 3.5–5.1)
Sodium: 138 mmol/L (ref 135–145)
Total Bilirubin: 0.7 mg/dL (ref 0.3–1.2)
Total Protein: 5.3 g/dL — ABNORMAL LOW (ref 6.5–8.1)

## 2018-08-15 LAB — CBC
HCT: 37.7 % (ref 36.0–46.0)
Hemoglobin: 12.7 g/dL (ref 12.0–15.0)
MCH: 33.4 pg (ref 26.0–34.0)
MCHC: 33.7 g/dL (ref 30.0–36.0)
MCV: 99.2 fL (ref 80.0–100.0)
Platelets: 189 10*3/uL (ref 150–400)
RBC: 3.8 MIL/uL — ABNORMAL LOW (ref 3.87–5.11)
RDW: 13.2 % (ref 11.5–15.5)
WBC: 7.3 10*3/uL (ref 4.0–10.5)
nRBC: 0 % (ref 0.0–0.2)

## 2018-08-15 LAB — GLUCOSE, CAPILLARY
Glucose-Capillary: 65 mg/dL — ABNORMAL LOW (ref 70–99)
Glucose-Capillary: 84 mg/dL (ref 70–99)

## 2018-08-15 LAB — CK: Total CK: 7921 U/L — ABNORMAL HIGH (ref 38–234)

## 2018-08-15 LAB — PHOSPHORUS: Phosphorus: 3.2 mg/dL (ref 2.5–4.6)

## 2018-08-15 MED ORDER — POTASSIUM CHLORIDE CRYS ER 20 MEQ PO TBCR
40.0000 meq | EXTENDED_RELEASE_TABLET | Freq: Once | ORAL | Status: AC
  Administered 2018-08-15: 40 meq via ORAL
  Filled 2018-08-15: qty 2

## 2018-08-15 MED ORDER — SENNOSIDES-DOCUSATE SODIUM 8.6-50 MG PO TABS: 1.0000 | ORAL_TABLET | Freq: Every evening | ORAL | 0 refills | Status: DC | PRN

## 2018-08-15 MED ORDER — CLOBETASOL PROPIONATE 0.05 % EX OINT: TOPICAL_OINTMENT | Freq: Two times a day (BID) | CUTANEOUS | 0 refills | Status: DC

## 2018-08-15 MED ORDER — HYDROCORTISONE 1 % EX CREA: TOPICAL_CREAM | Freq: Every evening | CUTANEOUS | 0 refills | Status: DC | PRN

## 2018-08-15 MED ORDER — POTASSIUM CHLORIDE CRYS ER 20 MEQ PO TBCR
40.0000 meq | EXTENDED_RELEASE_TABLET | Freq: Once | ORAL | Status: AC
  Administered 2018-08-15: 13:00:00 40 meq via ORAL

## 2018-08-15 MED ORDER — PREDNISONE 10 MG PO TABS: 70.0000 mg | ORAL_TABLET | Freq: Every day | ORAL | 0 refills | Status: DC

## 2018-08-15 MED ORDER — PANTOPRAZOLE SODIUM 40 MG PO TBEC: 40.0000 mg | DELAYED_RELEASE_TABLET | Freq: Every day | ORAL | 0 refills | Status: DC

## 2018-08-15 MED FILL — CLOBETASOL 0.05% OINTMENT: 0.05 | 15 days supply | Qty: 30 | Fill #0

## 2018-08-15 MED FILL — PANTOPRAZOLE SOD DR 40 MG T: 40 | 30 days supply | Qty: 30 | Fill #0

## 2018-08-15 MED FILL — SENNA S 8.6-50 MG TABS: 8.6-50 | 30 days supply | Qty: 30 | Fill #0

## 2018-08-15 MED FILL — HYDROCORTISONE 1% CREAM: 1 | 10 days supply | Qty: 28 | Fill #0

## 2018-08-15 MED FILL — predniSONE 10 MG TABS: 10 | 30 days supply | Qty: 210 | Fill #0

## 2018-08-15 NOTE — Progress Notes (Signed)
Pt cbg was 65 alert and oreinted and given snacks, rechecked cbg 84, will continue to monitor s/s of hypo/hyperglycemia.

## 2018-08-15 NOTE — Progress Notes (Signed)
Physical Therapy Treatment Patient Details Name: Lisa Mcdowell MRN: [id number] DOB: 03/26/1950 Today's Date: 08/15/2018    History of Present Illness 68 yo spanish speaking female admitted with rash x 4 months and elevated liver enzymes. Pt admitted with Rhabdomyolysis and rule out polymositis vs lupus vs systemic sclerosis.  PMH: gallstone,umbilical hernia repair 03/2669    PT Comments    Pt performed gait training and functional mobility during session this pm.  She is mildly unsteady with poor foot wear.  Pt educated on proper foot wear for home use to improve safety.  C/o pain in R quad during hip flexion.  Pt is to d/c home with support from family.  Ipad Stratus interpretation used Regino Schultze #[id number]).  Pt is progressing well.     Follow Up Recommendations  Home health PT;Supervision for mobility/OOB     Equipment Recommendations  None recommended by PT    Recommendations for Other Services       Precautions / Restrictions Precautions Precautions: Fall Restrictions Weight Bearing Restrictions: No    Mobility  Bed Mobility               General bed mobility comments: Pt seated in recliner on arrival.  Transfers Overall transfer level: Modified independent Equipment used: Rolling walker (2 wheeled) Transfers: Sit to/from Stand Sit to Stand: Modified independent (Device/Increase time)            Ambulation/Gait Ambulation/Gait assistance: Min guard Gait Distance (Feet): 350 Feet Assistive device: Rolling walker (2 wheeled) Gait Pattern/deviations: Step-through pattern;Decreased stride length;Staggering right;Staggering left;Drifts right/left Gait velocity: decreased   General Gait Details: Pt requested to use RW wearing slide on sandals.  Pt educated to wear shoes with a back to improve stability.   Cues for upper trunk control and forward gaze.   Stairs             Wheelchair Mobility    Modified Rankin (Stroke Patients Only)       Balance  Overall balance assessment: Needs assistance Sitting-balance support: No upper extremity supported Sitting balance-Leahy Scale: Normal       Standing balance-Leahy Scale: Poor Standing balance comment: able to maintain static balance but when performing dynamic activity often needs to stabilize self with use of UE's                            Cognition Arousal/Alertness: Awake/alert Behavior During Therapy: WFL for tasks assessed/performed Overall Cognitive Status: Within Functional Limits for tasks assessed                                        Exercises Total Joint Exercises Marching in Standing: AROM;Both;10 reps;Standing General Exercises - Lower Extremity Long Arc Quad: AROM;Both;10 reps;Seated Hip Flexion/Marching: Both;10 reps;Seated;AAROM Mini-Sqauts: Both;5 reps;Standing;AROM    General Comments        Pertinent Vitals/Pain Pain Assessment: Faces Faces Pain Scale: Hurts even more Pain Location: R quad Pain Descriptors / Indicators: Discomfort Pain Intervention(s): Monitored during session;Repositioned    Home Living                      Prior Function            PT Goals (current goals can now be found in the care plan section) Acute Rehab PT Goals Patient Stated Goal: thank you for helping  Potential  to Achieve Goals: Good    Frequency    Min 3X/week      PT Plan Current plan remains appropriate    Co-evaluation              AM-PAC PT "6 Clicks" Mobility   Outcome Measure  Help needed turning from your back to your side while in a flat bed without using bedrails?: None Help needed moving from lying on your back to sitting on the side of a flat bed without using bedrails?: None Help needed moving to and from a bed to a chair (including a wheelchair)?: A Little Help needed standing up from a chair using your arms (e.g., wheelchair or bedside chair)?: A Little Help needed to walk in hospital room?: A  Little Help needed climbing 3-5 steps with a railing? : A Little 6 Click Score: 20    End of Session Equipment Utilized During Treatment: Gait belt Activity Tolerance: Patient tolerated treatment well Patient left: in chair;with chair alarm set;with call bell/phone within reach Nurse Communication: Mobility status PT Visit Diagnosis: Unsteadiness on feet (R26.81);Muscle weakness (generalized) (M62.81)     Time: 1194-1740 PT Time Calculation (min) (ACUTE ONLY): 20 min  Charges:  $Gait Training: 8-22 mins                     Governor Rooks, PTA Acute Rehabilitation Services Pager [phone] Office [phone]     [name] Eli Hose 08/15/2018, 5:11 PM

## 2018-08-15 NOTE — Progress Notes (Signed)
Subjective: Patient resting in chair on exam. She reports her rash is much better. However her legs feel worse and they are heavy. The team shares her CK increased overnight and corresponds with how she feels. The plan today is to consult rheumatology at [facility] for advice.   Objective:  Vital signs in last 24 hours: Vitals:   08/14/18 0508 08/14/18 1327 08/14/18 2056 08/15/18 0423  BP: (!) 151/82 139/83 (!) 150/70 138/84  Pulse: 90 92 83 80  Resp: 16  15 17   Temp: 98.4 F (36.9 C) (!) 97.4 F (36.3 C) 98.4 F (36.9 C) 98.1 F (36.7 C)  TempSrc: Oral Oral Oral Oral  SpO2: 100% 97% 96% 95%  Weight:       Physical Exam Vitals signs reviewed.  Constitutional:      General: She is not in acute distress.    Appearance: Normal appearance. She is obese. She is not ill-appearing, toxic-appearing or diaphoretic.  HENT:     Head: Normocephalic and atraumatic.  Eyes:     General: No scleral icterus.       Right eye: No discharge.        Left eye: No discharge.     Extraocular Movements: Extraocular movements intact.  Cardiovascular:     Rate and Rhythm: Normal rate.     Pulses: Normal pulses.     Heart sounds: Normal heart sounds. No murmur. No friction rub. No gallop.   Pulmonary:     Effort: Pulmonary effort is normal. No respiratory distress.     Breath sounds: Normal breath sounds. No wheezing or rales.  Abdominal:     General: Abdomen is flat. Bowel sounds are normal. There is no distension.     Palpations: Abdomen is soft.     Tenderness: There is no abdominal tenderness. There is no guarding.  Musculoskeletal:        General: No swelling.     Right lower leg: Edema present.     Left lower leg: Edema present.     Comments: 2+ pitting edema in BL lower extremities  Skin:    General: Skin is warm.     Coloration: Skin is not jaundiced.     Findings: Erythema and rash present.     Comments: bilateral lower back erythematous rashes, improved from days prior    Neurological:     General: No focal deficit present.     Mental Status: She is alert and oriented to person, place, and time. Mental status is at baseline.  Psychiatric:        Mood and Affect: Mood normal.        Behavior: Behavior normal.        Thought Content: Thought content normal.        Judgment: Judgment normal.    Assessment/Plan:  Principal Problem:   Secondary rhabdomyolysis Active Problems:   Adult type dermatomyositis (HCC)   Elevated CK  In summary,Anisah Grinder is a 62 y/oF with a hx of GERD, and HLD who presented to the ED with progressive lower extremity weakness, muscle pain, diffuse itchy rash, elevated CK, tramnsaminitis and hypocalcemia (8.6). The erythematous rash was present on her hairline, sun exposed skin areas of the upper chest and back, and lower back. She also haspathopneumonic Gottron's papules.Highly suspicious for dermatomyositis.   #Dermatomyositis: Ms. Hendriks presentation of rash on the hairline, sun exposed areas, as well as the presence ofpathognomonic Gottron's papules in addition to muscle weakness and joint pain on admission is  highly suspicious of dermatomyositis. She has had this worked up in the outpatient setting, where a skin biopsy was performed in April 2020. Dr. Collene Mares from [facility] recited the biopsy results with dermatology was spongiotic dermatitis. She was diagnosed with contact dermatitis and treated with Xyzal, prednisone taper and PRN benadryl with minimal relief at that time.  -Continue Prednisoneat 1mg /kg dosing (70mg ).Will likely need this dose for 6 weeks. - Started Protonix 40 mg daily since she will be on steroids for long term - Hydrocortisone cream nightly PRN for itching  -The patient will need close outpatient follow up upon discharge with rheumatology  #Rhabdomyolysis: CKwas 10,282 on admission,trending downward. 5,744 yesterday and bumped up to 7,921 today. -Continue NS200 mL/hourinfusion -  Monitor CK levelsdaily  #Transaminitis: AST/ALT 374/109 on admission, now 345/118. SJG283/66 yesterday . 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  - IVF as mentioned above - Trend w/ daily BMP  - Consider GGT if AST/ALT continue to start trending upward. Low suspicion for hepatic injury at the moment.    #T2DM: Hgb A1c 6.0 on admission. - SSI - Will need close f/u in outpatient setting for diabetes management, especially that we expect her to be on long term steroids  - Diabetes counseling  #FEN/GI #Hypokalemia: Repleted today #Hypocalcemia: 7.8 on admission, corrected level elevated at 11 - Continue IVF as noted above - Monitor with CMP daily  #DVT prophylaxis: Lovenox injection 40mg  daily  #Code status: Full #Dispo:PT/OT recommends home health. This is challenging given patient's uninsured status. Talked to Dr. Carmin Muskrat with [facility] and will follow this patient upon discharge. They will set up appointment.  Earlene Plater, MD Internal Medicine, PGY1 Pager: [phone]  08/15/2018,5:16 PM

## 2018-08-15 NOTE — Discharge Summary (Addendum)
Name: Lisa Mcdowell MRN: [id number] DOB: 30-Dec-1950 68 y.o. PCP: Department, [facility]  Date of Admission: 08/10/2018  3:21 PM Date of Discharge: 08/15/18 Attending Physician: Oda Kilts, MD  Discharge Diagnosis: 1. Dermatomyositis 2. Prediabetes  Discharge Medications: Allergies as of 08/15/2018   No Known Allergies      Medication List     STOP taking these medications    methylPREDNISolone 4 MG Tbpk tablet Commonly known as: MEDROL DOSEPAK   oxyCODONE 5 MG immediate release tablet Commonly known as: Oxy IR/ROXICODONE       TAKE these medications    acetaminophen 325 MG tablet Commonly known as: TYLENOL Take 650 mg by mouth every 4 (four) hours as needed for mild pain.   amitriptyline 25 MG tablet Commonly known as: ELAVIL Take 25 mg by mouth at bedtime. Joint pain   augmented betamethasone dipropionate 0.05 % cream Commonly known as: DIPROLENE-AF Apply 1 application topically 2 (two) times a day. rash   betamethasone dipropionate 0.05 % ointment Commonly known as: DIPROLENE Apply 1 application topically 2 (two) times a day. Rash   citalopram 10 MG tablet Commonly known as: CELEXA Take 10 mg by mouth daily.   clobetasol ointment 0.05 % Commonly known as: TEMOVATE Apply topically 2 (two) times daily.   desonide 0.05 % cream Commonly known as: DESOWEN Apply 1 application topically 2 (two) times daily as needed for rash. itching   doxepin 25 MG capsule Commonly known as: SINEQUAN Take 25 mg by mouth 3 (three) times daily.   ezetimibe 10 MG tablet Commonly known as: ZETIA Take 10 mg by mouth daily.   hydrocortisone cream 1 % Apply topically at bedtime as needed for itching.   hydrOXYzine 25 MG tablet Commonly known as: ATARAX/VISTARIL Take 1 tablet (25 mg total) by mouth every 8 (eight) hours as needed for itching.   multivitamin with minerals tablet Take 1 tablet by mouth daily.   pantoprazole 40 MG  tablet Commonly known as: PROTONIX Take 1 tablet (40 mg total) by mouth daily. Start taking on: August 16, 2018   predniSONE 10 MG tablet Commonly known as: DELTASONE Take 7 tablets (70 mg total) by mouth daily with breakfast. Start taking on: August 16, 2018   senna-docusate 8.6-50 MG tablet Commonly known as: Senokot-S Take 1 tablet by mouth at bedtime as needed for mild constipation or moderate constipation.       Disposition and follow-up:   Lisa Mcdowell was discharged from [facility] in fair condition.  At the hospital follow up visit please address:  1. Dermatomyositis: Presented with diffuse erythematous rashes primarily in sun exposed areas, pathopneumonic Gottron's papules, elevated CK, transaminitis and muscle weakness. Responded well to IVF and steroids during hospitalization. Will need further work-up to rule out other rheumatologic or autoimmune conditions. Will also need to consider the long term effects of steroid use and consider addition of medications to reduce those risks.  2. Prediabetes: Hgb A1c 6.0 on admission. Sugars controlled with SSI.  3.  Labs / imaging needed at time of follow-up: CBC, CMP, CK, consider MRI guided muscle biopsy and EMG  4.  Pending labs/ test needing follow-up: ANA, Anti-Jo 1 antibody, Sjogrens syndrome nuclear antibodies (A & B)  Follow-up Appointments:    Spoke with Dr. [name], and rheumatology fellow at [facility], who will reach out to the patient and schedule follow-up appointment next week.  Hospital Course by problem list: 1. Dermatomyositis:  Presented to the  ED with progressive lower extremity weakness, muscle pain, and an itchy erythematous rash on the scalp, around the eyes, ears, across the upper back and arms, across the flanks, in a "holster" distribution, and Gottron's papules (see H and P for photos).  She reported the rash had been present for 4 months and she had been evaluated by a  dermatologist, the health department, and a physician and [facility]. She had a previous skin biopsy reportedly showing "spongiotic dermatitis".   Her labs were notable for an elevated CK to 10,282, elevated AST/ALT to 374/109.  She was given IV fluids and high-dose prednisone dosed at 1 mg/kg (total 70mg ). She responded well to this treatment with improvement in her itching and slight decrease in erythema. CK downtrended to 9833 but bumped up to 7921.   We discussed her case with Dr. [name], a rheumatology fellow at [facility], who explained that [place] may remain elevated over the next several weeks to months in dermatomyositis and can be managed from an outpatient setting. He said they would reach out to the patient themselves and schedule an appointment for close follow-up.  He did recommend additional evaluation with myositis autoantibodies, and consideration of EMG and MRI to guide muscle biopsy, but the patient was unwilling to stay longer for further evaluation.  - Discharged on Protonix 40mg  daily as prophylaxis for GI related side effects of long term steroid use - Consider bisphosphonate or SERM therapy to decrease risk of bone related side effects of long term steroid use.  2. Prediabetes: Hgb A1c 6.0 on admission.  Will likely need follow-up in outpatient setting for blood sugar management, given the fact that she will be on long-term steroids.  Diabetes education team was consulted during her stay.  Discharge Vitals:   BP 138/80 (BP Location: Left Arm)    Pulse 89    Temp 98 F (36.7 C) (Oral)    Resp 16    Wt 72.6 kg    SpO2 98%    BMI 25.82 kg/m   Pertinent Labs, Studies, and Procedures:   CBC Latest Ref Rng & Units 08/15/2018 08/14/2018 08/13/2018  WBC 4.0 - 10.5 K/uL 7.3 6.9 6.9  Hemoglobin 12.0 - 15.0 g/dL 12.7 12.4 11.5(L)  Hematocrit 36.0 - 46.0 % 37.7 37.1 34.5(L)  Platelets 150 - 400 K/uL 189 180 146(L)    CMP Latest Ref Rng & Units 08/15/2018 08/14/2018 08/13/2018    Glucose 70 - 99 mg/dL 88 84 101(H)  BUN 8 - 23 mg/dL 7(L) 5(L) 7(L)  Creatinine 0.44 - 1.00 mg/dL 0.42(L) 0.51 0.47  Sodium 135 - 145 mmol/L 138 141 140  Potassium 3.5 - 5.1 mmol/L 3.1(L) 3.6 3.2(L)  Chloride 98 - 111 mmol/L 104 109 111  CO2 22 - 32 mmol/L 25 26 24   Calcium 8.9 - 10.3 mg/dL 8.2(L) 8.1(L) 7.9(L)  Total Protein 6.5 - 8.1 g/dL 5.3(L) 5.5(L) 5.2(L)  Total Bilirubin 0.3 - 1.2 mg/dL 0.7 0.7 0.4  Alkaline Phos 38 - 126 U/L 38 40 37(L)  AST 15 - 41 U/L 346(H) 345(H) 296(H)  ALT 0 - 44 U/L 122(H) 118(H) 95(H)   CK trend: Ref range (38-234): 7921, 5744, 7240, 10282  Discharge Instructions:  [facility] will reach out you to schedule a follow up appointment.  Discharge Instructions     Call MD for:  difficulty breathing, headache or visual disturbances   Complete by: As directed    Call MD for:  extreme fatigue   Complete by: As  directed    Call MD for:  hives   Complete by: As directed    Call MD for:  persistant dizziness or light-headedness   Complete by: As directed    Call MD for:  persistant nausea and vomiting   Complete by: As directed    Call MD for:  severe uncontrolled pain   Complete by: As directed    Call MD for:  temperature >100.4   Complete by: As directed    Diet - low sodium heart healthy   Complete by: As directed    Increase activity slowly   Complete by: As directed       Signed: Earlene Plater, MD Internal Medicine, PGY1 Pager: [phone]  08/17/2018,10:54 AM

## 2018-08-15 NOTE — Plan of Care (Signed)
  Problem: Safety: Goal: Ability to remain free from injury will improve Outcome: Progressing   

## 2018-08-15 NOTE — Progress Notes (Signed)
Pt for discharge going home, discontinued peripheral IV line, pharmacy given her meds, no complain of pain, given health teachings, next appointment, given all her personal belongings, no s/s of distress at this time.

## 2018-08-16 LAB — ANA W/REFLEX IF POSITIVE: Anti Nuclear Antibody (ANA): NEGATIVE

## 2018-08-16 LAB — SJOGRENS SYNDROME-B EXTRACTABLE NUCLEAR ANTIBODY: SSB (La) (ENA) Antibody, IgG: <0.2 AI (ref 0.0–0.9)

## 2018-08-16 LAB — ANTI-JO 1 ANTIBODY, IGG: Anti JO-1: <0.2 AI (ref 0.0–0.9)

## 2018-08-16 LAB — SJOGRENS SYNDROME-A EXTRACTABLE NUCLEAR ANTIBODY: SSA (Ro) (ENA) Antibody, IgG: 0.2 AI (ref 0.0–0.9)

## 2018-08-25 ENCOUNTER — Other Ambulatory Visit: Payer: Self-pay

## 2018-08-25 ENCOUNTER — Inpatient Hospital Stay (HOSPITAL_COMMUNITY)
Admission: EM | Admit: 2018-08-25 | Discharge: 2018-09-22 | DRG: 500 | Disposition: A | Discharge status: Rehabilitation Facility | Payer: Medicaid Other | Attending: Student in an Organized Health Care Education/Training Program | Admitting: Student in an Organized Health Care Education/Training Program

## 2018-08-25 ENCOUNTER — Telehealth: Payer: Self-pay

## 2018-08-25 ENCOUNTER — Encounter (HOSPITAL_COMMUNITY): Payer: Self-pay | Admitting: *Deleted

## 2018-08-25 DIAGNOSIS — E785 Hyperlipidemia, unspecified: Secondary | ICD-10-CM | POA: Diagnosis present

## 2018-08-25 DIAGNOSIS — R748 Abnormal levels of other serum enzymes: Secondary | ICD-10-CM | POA: Diagnosis present

## 2018-08-25 DIAGNOSIS — R1312 Dysphagia, oropharyngeal phase: Secondary | ICD-10-CM | POA: Diagnosis present

## 2018-08-25 DIAGNOSIS — D696 Thrombocytopenia, unspecified: Secondary | ICD-10-CM | POA: Diagnosis present

## 2018-08-25 DIAGNOSIS — Z79899 Other long term (current) drug therapy: Secondary | ICD-10-CM | POA: Diagnosis not present

## 2018-08-25 DIAGNOSIS — K219 Gastro-esophageal reflux disease without esophagitis: Secondary | ICD-10-CM

## 2018-08-25 DIAGNOSIS — E876 Hypokalemia: Secondary | ICD-10-CM | POA: Diagnosis not present

## 2018-08-25 DIAGNOSIS — E119 Type 2 diabetes mellitus without complications: Secondary | ICD-10-CM

## 2018-08-25 DIAGNOSIS — Z7952 Long term (current) use of systemic steroids: Secondary | ICD-10-CM | POA: Diagnosis not present

## 2018-08-25 DIAGNOSIS — E669 Obesity, unspecified: Secondary | ICD-10-CM | POA: Diagnosis present

## 2018-08-25 DIAGNOSIS — Z20828 Contact with and (suspected) exposure to other viral communicable diseases: Secondary | ICD-10-CM | POA: Diagnosis present

## 2018-08-25 DIAGNOSIS — R7401 Elevation of levels of liver transaminase levels: Secondary | ICD-10-CM | POA: Diagnosis present

## 2018-08-25 DIAGNOSIS — K2961 Other gastritis with bleeding: Secondary | ICD-10-CM | POA: Diagnosis present

## 2018-08-25 DIAGNOSIS — M3320 Polymyositis, organ involvement unspecified: Principal | ICD-10-CM | POA: Diagnosis present

## 2018-08-25 DIAGNOSIS — M6282 Rhabdomyolysis: Secondary | ICD-10-CM | POA: Diagnosis present

## 2018-08-25 DIAGNOSIS — D649 Anemia, unspecified: Secondary | ICD-10-CM | POA: Diagnosis present

## 2018-08-25 DIAGNOSIS — Z6826 Body mass index (BMI) 26.0-26.9, adult: Secondary | ICD-10-CM

## 2018-08-25 DIAGNOSIS — R1313 Dysphagia, pharyngeal phase: Secondary | ICD-10-CM | POA: Diagnosis present

## 2018-08-25 DIAGNOSIS — T380X5A Adverse effect of glucocorticoids and synthetic analogues, initial encounter: Secondary | ICD-10-CM | POA: Diagnosis present

## 2018-08-25 DIAGNOSIS — L89312 Pressure ulcer of right buttock, stage 2: Secondary | ICD-10-CM | POA: Diagnosis not present

## 2018-08-25 DIAGNOSIS — R1310 Dysphagia, unspecified: Secondary | ICD-10-CM

## 2018-08-25 DIAGNOSIS — M3310 Other dermatopolymyositis, organ involvement unspecified: Secondary | ICD-10-CM | POA: Diagnosis present

## 2018-08-25 DIAGNOSIS — L8990 Pressure ulcer of unspecified site, unspecified stage: Secondary | ICD-10-CM | POA: Insufficient documentation

## 2018-08-25 DIAGNOSIS — Z9049 Acquired absence of other specified parts of digestive tract: Secondary | ICD-10-CM | POA: Diagnosis not present

## 2018-08-25 DIAGNOSIS — F419 Anxiety disorder, unspecified: Secondary | ICD-10-CM | POA: Diagnosis present

## 2018-08-25 DIAGNOSIS — K760 Fatty (change of) liver, not elsewhere classified: Secondary | ICD-10-CM | POA: Diagnosis present

## 2018-08-25 DIAGNOSIS — E1165 Type 2 diabetes mellitus with hyperglycemia: Secondary | ICD-10-CM | POA: Diagnosis present

## 2018-08-25 DIAGNOSIS — G479 Sleep disorder, unspecified: Secondary | ICD-10-CM | POA: Diagnosis present

## 2018-08-25 DIAGNOSIS — Z4659 Encounter for fitting and adjustment of other gastrointestinal appliance and device: Secondary | ICD-10-CM

## 2018-08-25 HISTORY — DX: Superficial mycosis, unspecified: B36.9

## 2018-08-25 LAB — CK: Total CK: 8335 U/L — ABNORMAL HIGH (ref 38–234)

## 2018-08-25 LAB — COMPREHENSIVE METABOLIC PANEL
ALT: 235 U/L — ABNORMAL HIGH (ref 0–44)
AST: 626 U/L — ABNORMAL HIGH (ref 15–41)
Albumin: 3 g/dL — ABNORMAL LOW (ref 3.5–5.0)
Alkaline Phosphatase: 42 U/L (ref 38–126)
Anion gap: 13 (ref 5–15)
BUN: 21 mg/dL (ref 8–23)
CO2: 23 mmol/L (ref 22–32)
Calcium: 8.7 mg/dL — ABNORMAL LOW (ref 8.9–10.3)
Chloride: 102 mmol/L (ref 98–111)
Creatinine, Ser: 0.57 mg/dL (ref 0.44–1.00)
GFR calc Af Amer: >60 mL/min (ref >60)
GFR calc non Af Amer: >60 mL/min (ref >60)
Glucose, Bld: 126 mg/dL — ABNORMAL HIGH (ref 70–99)
Potassium: 4.1 mmol/L (ref 3.5–5.1)
Sodium: 138 mmol/L (ref 135–145)
Total Bilirubin: 1 mg/dL (ref 0.3–1.2)
Total Protein: 6.4 g/dL — ABNORMAL LOW (ref 6.5–8.1)

## 2018-08-25 LAB — CBC WITH DIFFERENTIAL/PLATELET
Abs Immature Granulocytes: 0.14 10*3/uL — ABNORMAL HIGH (ref 0.00–0.07)
Basophils Absolute: 0 10*3/uL (ref 0.0–0.1)
Basophils Relative: 0 %
Eosinophils Absolute: 0.1 10*3/uL (ref 0.0–0.5)
Eosinophils Relative: 1 %
HCT: 44.9 % (ref 36.0–46.0)
Hemoglobin: 14.9 g/dL (ref 12.0–15.0)
Immature Granulocytes: 2 %
Lymphocytes Relative: 12 %
Lymphs Abs: 1 10*3/uL (ref 0.7–4.0)
MCH: 33.3 pg (ref 26.0–34.0)
MCHC: 33.2 g/dL (ref 30.0–36.0)
MCV: 100.2 fL — ABNORMAL HIGH (ref 80.0–100.0)
Monocytes Absolute: 0.4 10*3/uL (ref 0.1–1.0)
Monocytes Relative: 5 %
Neutro Abs: 6.8 10*3/uL (ref 1.7–7.7)
Neutrophils Relative %: 80 %
Platelets: 224 10*3/uL (ref 150–400)
RBC: 4.48 MIL/uL (ref 3.87–5.11)
RDW: 13.8 % (ref 11.5–15.5)
WBC: 8.4 10*3/uL (ref 4.0–10.5)
nRBC: 0 % (ref 0.0–0.2)

## 2018-08-25 MED ORDER — METHYLPREDNISOLONE SODIUM SUCC 125 MG IJ SOLR
60.0000 mg | INTRAMUSCULAR | Status: DC
  Administered 2018-08-25 – 2018-08-28 (×4): 60 mg via INTRAVENOUS
  Filled 2018-08-25 (×4): qty 2

## 2018-08-25 MED ORDER — ONDANSETRON HCL 4 MG PO TABS: 4.0000 mg | ORAL_TABLET | Freq: Four times a day (QID) | ORAL | Status: DC | PRN

## 2018-08-25 MED ORDER — ACETAMINOPHEN 325 MG PO TABS: 650.0000 mg | ORAL_TABLET | Freq: Four times a day (QID) | ORAL | Status: DC | PRN

## 2018-08-25 MED ORDER — SODIUM CHLORIDE 0.9 % IV SOLN
INTRAVENOUS | Status: DC
  Administered 2018-08-25 – 2018-08-26 (×2): via INTRAVENOUS

## 2018-08-25 MED ORDER — LACTATED RINGERS IV BOLUS
1000.0000 mL | Freq: Once | INTRAVENOUS | Status: AC
  Administered 2018-08-25: 1000 mL via INTRAVENOUS

## 2018-08-25 MED ORDER — INSULIN ASPART 100 UNIT/ML ~~LOC~~ SOLN
0.0000 [IU] | Freq: Three times a day (TID) | SUBCUTANEOUS | Status: DC
  Administered 2018-08-26: 1 [IU] via SUBCUTANEOUS
  Administered 2018-08-27 – 2018-08-29 (×3): 2 [IU] via SUBCUTANEOUS
  Administered 2018-08-31 – 2018-09-01 (×3): 1 [IU] via SUBCUTANEOUS
  Administered 2018-09-02 (×3): 2 [IU] via SUBCUTANEOUS
  Administered 2018-09-03 – 2018-09-04 (×5): 1 [IU] via SUBCUTANEOUS
  Administered 2018-09-05 – 2018-09-09 (×9): 2 [IU] via SUBCUTANEOUS
  Administered 2018-09-10: 1 [IU] via SUBCUTANEOUS
  Administered 2018-09-10 (×2): 2 [IU] via SUBCUTANEOUS

## 2018-08-25 MED ORDER — ENOXAPARIN SODIUM 40 MG/0.4ML ~~LOC~~ SOLN
40.0000 mg | SUBCUTANEOUS | Status: DC
  Administered 2018-08-25 – 2018-09-01 (×8): 40 mg via SUBCUTANEOUS
  Filled 2018-08-25 (×9): qty 0.4

## 2018-08-25 MED ORDER — ONDANSETRON HCL 4 MG/2ML IJ SOLN: 4.0000 mg | Freq: Four times a day (QID) | INTRAMUSCULAR | Status: DC | PRN

## 2018-08-25 MED ORDER — SODIUM CHLORIDE 0.9% FLUSH
3.0000 mL | Freq: Two times a day (BID) | INTRAVENOUS | Status: DC
  Administered 2018-08-25 – 2018-08-26 (×2): 3 mL via INTRAVENOUS

## 2018-08-25 MED ORDER — ACETAMINOPHEN 650 MG RE SUPP
650.0000 mg | Freq: Four times a day (QID) | RECTAL | Status: DC | PRN
  Filled 2018-08-25: qty 1

## 2018-08-25 NOTE — H&P (Signed)
Date: 08/25/2018               Patient Name:  Lisa Mcdowell MRN: [id number]  DOB: 01-04-1951 Age / Sex: 68 y.o., female   PCP: Department, [facility] Service: Internal Medicine Teaching Service         Attending Physician: Dr. Heber Bottineau, Rachel Moulds, DO    First Contact: Dr. Charleen Kirks  Pager: [phone]  Second Contact: Dr. Tarri Abernethy  Pager: [phone]       After Hours (After 5p/  First Contact Pager: [phone]  weekends / holidays): Second Contact Pager: [phone]   Chief Complaint: dysphagia  History of Present Illness: This is a 68 year old female with a PMH significant for dermatomysitis which appears to be a fairly new diagnosis for her. Patient was recently hospitalized for 7/26-7/31 for dermatomyositis and was subsequently treated with IV steroids.  She is discharged with p.o. prednisone.  She was post to follow-up with rheumatology on 8/18. Patient presents to the ED today for 4-day history of progressively worsening dysphagia.  This is worsened to the extent that the patient was unable to tolerate any food, liquids, medications p.o. today and yesterday.  Patient describes this as a feeling that the food is getting stuck in the back of her throat however she notes that she does not feel like she is having an issue initiating the swallow.  Patient also complains of leg pain after walking even a short distance.  This was a prior issue that was associated with the dermatomyositis diagnosis.  Patient denies any throat pain, voice change, shortness of breath, or chest pain.  Patient denies any change in the rash that she has over her eyes, on her extremities, or back.  She denies any prior issues with  dysphagia in the past and reports that she was eating and drinking like normal 1 week ago.  Meds:  Current Meds  Medication Sig  . Acetylcysteine (NAC PO) Take 1,000 mg by mouth 3 (three) times daily.  Marland Kitchen amitriptyline (ELAVIL) 25 MG tablet Take 25 mg by mouth at bedtime  as needed (muscle and joint pain).   . clobetasol ointment (TEMOVATE) 0.05 % Apply topically 2 (two) times daily. (Patient taking differently: Apply 1 application topically 2 (two) times daily as needed (itching). )  . doxepin (SINEQUAN) 25 MG capsule Take 25 mg by mouth 3 (three) times daily.  . hydrocortisone cream 1 % Apply topically at bedtime as needed for itching. (Patient taking differently: Apply 1 application topically at bedtime as needed for itching. )  . hydrOXYzine (ATARAX/VISTARIL) 25 MG tablet Take 1 tablet (25 mg total) by mouth every 8 (eight) hours as needed for itching.  . pantoprazole (PROTONIX) 40 MG tablet Take 1 tablet (40 mg total) by mouth daily.  . predniSONE (DELTASONE) 10 MG tablet Take 7 tablets (70 mg total) by mouth daily with breakfast.  . senna-docusate (SENOKOT-S) 8.6-50 MG tablet Take 1 tablet by mouth at bedtime as needed for mild constipation or moderate constipation.  . Vitamin D, Ergocalciferol, (DRISDOL) 1.25 MG (50000 UT) CAPS capsule Take 50,000 Units by mouth every Wednesday.     Allergies: Allergies as of 08/25/2018  . (No Known Allergies)   Past Medical History:  Diagnosis Date  . Cholelithiasis   . Dermatomycosis   . Gallstones 08/08/2010  . GERD (gastroesophageal reflux disease)    pt denies having GERD on day of surgery  .  Hyperlipidemia   . Nausea & vomiting     Family History:  Family History  Problem Relation Age of Onset  . Colon cancer Neg Hx      Social History: Denies tobacco and alcohol use.  Review of Systems: Review of Systems  Constitutional: Positive for malaise/fatigue. Negative for chills and fever.  HENT: Negative for sore throat.   Eyes: Negative for blurred vision and photophobia.  Respiratory: Negative for cough, shortness of breath and stridor.   Cardiovascular: Negative for chest pain, orthopnea and leg swelling.  Gastrointestinal: Negative for abdominal pain, nausea and vomiting.  Musculoskeletal: Positive  for joint pain and myalgias.  Skin: Positive for itching and rash.  Neurological: Negative for dizziness, speech change and weakness.  Endo/Heme/Allergies: Negative for polydipsia.  Psychiatric/Behavioral: Negative for depression.     Physical Exam: Blood pressure 132/78, pulse (!) 101, temperature 99.2 F (37.3 C), temperature source Oral, resp. rate 13, weight 71.5 kg, SpO2 100 %.  GENERAL: ill appearing HEENT: head atraumatic. No conjunctival injection. Nares patent. Small oropharynx but is patent. CARDIAC: heart RRR. No peripheral edema.  PULMONARY: acyanotic. Breathing comfortably on RA. No stridor. Lung sounds clear to auscultation. ABDOMEN: soft. Nontender to palpation.  Nondistended. No organomegaly appreciated. NEURO: CN II-XII grossly intact. SKIN: heliotropic rash. Raised rash to bilateral upper and lower extremities, buttocks and lower back. PSYCH: A/Ox3. 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   1. Dysphagia. Likely secondary to dermatomyositis. I believe this is a relatively new diagnosis as patient was just recently discharged from the hospitalization that revealed this diagnosis. At time of discharge, she was prescribed 70mg  prednisone daily which she reports to be taking up until the last 2 days when she was no longer able to swallow food or liquids. Additionally, she was to have an appointment at [facility] with rheumatology on 8/18. We may want to communicate with [facility] regarding further recs as well.  -CK is 8335 today. Per rheumatology note from prior admission, this may continue to remain elevated for some time even after starting treatment. The slight bump up from 7921  may be related to patient being unable to take prednisone the last couple of days. Renal function stable. Will repeat CK in AM. Will continue IVF. -LFTs elevated. Likely related to dermatomyositis -Patient will likely need upper endoscopy for evaluation of the dysphagia. Barium swallow would likely be low yield as patient is not coughing or having any signs of aspiration. ST consult placed. GI consult in AM. -CXR and EKG pending -solumedrol 60mg  q 24 hr -hold PO medications -NPO  2. Diabetes mellitus type 2. SSI  CODE STATUS: Diet: NPO IV fluids: 174mL/hr DVT for prophylaxis: lovenox Lines: IV    Dispo: Admit patient to Inpatient with expected length of stay greater than 2 midnights. Patient should be investigated for dermatomyositis 2/2 malignancy by receiving age appropriate cancer screenings.  Signed: Mitzi Hansen, MD 08/25/2018, 11:06 PM  Pager: [phone]

## 2018-08-25 NOTE — Telephone Encounter (Signed)
Returned call to pt's daughter Ms Jerilee Hoh  -no answer; left message to call the office.

## 2018-08-25 NOTE — Telephone Encounter (Signed)
Thank you. Yes, if she is having trouble swallowing, she should be evaluated urgently and likely will require admission. They could consider taking her to [facility] as she will likely need rheumatology consultation.

## 2018-08-25 NOTE — ED Notes (Signed)
ED TO INPATIENT HANDOFF REPORT  ED Nurse Name and Phone #: [phone] Threasa Beards, RN  S Name/Age/Gender Lisa Mcdowell 67 y.o. female Room/Bed: 037C/037C  Code Status   Code Status: Full Code  Home/SNF/Other Home Patient oriented to: self, place, time and situation Is this baseline? Yes   Triage Complete: Triage complete  Chief Complaint dermatomyostis-unable to eat-weakness  Triage Note Pt states this am she was unable to swallow, even water.  Denies pain or difficulty breathing.  Airway patent.     Allergies No Known Allergies  Level of Care/Admitting Diagnosis ED Disposition    ED Disposition Condition [facility] Area: [place] [[id number]]  Level of Care: Telemetry Medical [104]  Covid Evaluation: Asymptomatic Screening Protocol (No Symptoms)  Diagnosis: Dysphagia [585277]  Admitting Physician: Bosie Helper  Attending Physician: Lucious Groves [[id number]]  Estimated length of stay: past midnight tomorrow  Certification:: I certify this patient will need inpatient services for at least 2 midnights  PT Class (Do Not Modify): Inpatient [101]  PT Acc Code (Do Not Modify): Private [1]       B Medical/Surgery History Past Medical History:  Diagnosis Date  . Cholelithiasis   . Dermatomycosis   . Gallstones 08/08/2010  . GERD (gastroesophageal reflux disease)    pt denies having GERD on day of surgery  . Hyperlipidemia   . Nausea & vomiting    Past Surgical History:  Procedure Laterality Date  . CHOLECYSTECTOMY  08/08/2010   Procedure: LAPAROSCOPIC CHOLECYSTECTOMY;  Surgeon: Scherry Ran;  Location: [facility];  Service: General;  Laterality: N/A;  . COLONOSCOPY N/A 04/15/2015   Procedure: COLONOSCOPY;  Surgeon: Danie Binder, MD;  [facility];  Service: Endoscopy;  Laterality: N/A;  1015 - moved to 10:00 - office to notify - interpreter scheduled, do NOT change time  . INSERTION OF MESH N/A 01/23/2018   Procedure: INSERTION OF MESH;  Surgeon: Donnie Mesa, MD;  Location: [place];  Service: General;  Laterality: N/A;  . UMBILICAL HERNIA REPAIR N/A 01/23/2018   Procedure: UMBILICAL HERNIA REPAIR WITH MESH;  Surgeon: Donnie Mesa, MD;  Location: [place];  Service: General;  Laterality: N/A;  . [place]     A IV Location/Drains/Wounds Patient Lines/Drains/Airways Status   Active Line/Drains/Airways    Name:   Placement date:   Placement time:   Site:   Days:   Peripheral IV 08/13/18 Anterior;Right Forearm   08/13/18    1548    Forearm   12   Peripheral IV 08/25/18 Right   08/25/18    1719    -   less than 1   Incision (Closed) 01/23/18 Abdomen Other (Comment)   01/23/18    1211     214          Intake/Output Last 24 hours No intake or output data in the 24 hours ending 08/25/18 2028  Labs/Imaging Results for orders placed or performed during the hospital encounter of 08/25/18 (from the past 48 hour(s))  Comprehensive metabolic panel     Status: Abnormal   Collection Time: 08/25/18  5:20 PM  Result Value Ref Range   Sodium 138 135 - 145 mmol/L   Potassium 4.1 3.5 - 5.1 mmol/L   Chloride 102 98 - 111 mmol/L   CO2 23 22 - 32 mmol/L   Glucose, Bld 126 (H) 70 - 99 mg/dL  BUN 21 8 - 23 mg/dL   Creatinine, Ser 0.57 0.44 - 1.00 mg/dL   Calcium 8.7 (L) 8.9 - 10.3 mg/dL   Total Protein 6.4 (L) 6.5 - 8.1 g/dL   Albumin 3.0 (L) 3.5 - 5.0 g/dL   AST 626 (H) 15 - 41 U/L   ALT 235 (H) 0 - 44 U/L   Alkaline Phosphatase 42 38 - 126 U/L   Total Bilirubin 1.0 0.3 - 1.2 mg/dL   GFR calc non Af Amer >60 >60 mL/min   GFR calc Af Amer >60 >60 mL/min   Anion gap 13 5 - 15    Comment: Performed at [facility], [place] [address], [place], [place] [address]  CBC with Differential     Status: Abnormal   Collection Time: 08/25/18  5:20 PM  Result Value Ref Range   WBC 8.4 4.0 - 10.5 K/uL   RBC 4.48 3.87 - 5.11 MIL/uL    Hemoglobin 14.9 12.0 - 15.0 g/dL   HCT 44.9 36.0 - 46.0 %   MCV 100.2 (H) 80.0 - 100.0 fL   MCH 33.3 26.0 - 34.0 pg   MCHC 33.2 30.0 - 36.0 g/dL   RDW 13.8 11.5 - 15.5 %   Platelets 224 150 - 400 K/uL   nRBC 0.0 0.0 - 0.2 %   Neutrophils Relative % 80 %   Neutro Abs 6.8 1.7 - 7.7 K/uL   Lymphocytes Relative 12 %   Lymphs Abs 1.0 0.7 - 4.0 K/uL   Monocytes Relative 5 %   Monocytes Absolute 0.4 0.1 - 1.0 K/uL   Eosinophils Relative 1 %   Eosinophils Absolute 0.1 0.0 - 0.5 K/uL   Basophils Relative 0 %   Basophils Absolute 0.0 0.0 - 0.1 K/uL   Immature Granulocytes 2 %   Abs Immature Granulocytes 0.14 (H) 0.00 - 0.07 K/uL    Comment: Performed at [facility] [address] [place], [place] [address]  CK     Status: Abnormal   Collection Time: 08/25/18  6:39 PM  Result Value Ref Range   Total CK 8,335 (H) 38 - 234 U/L    Comment: RESULTS CONFIRMED BY MANUAL DILUTION Performed at [facility], [place] [address], [place], [place] [address]    No results found.  Pending Labs Unresulted Labs (From admission, onward)    Start     Ordered   08/26/18 1638  Basic metabolic panel  Tomorrow morning,   R     08/25/18 2021   08/26/18 0500  CK  Tomorrow morning,   R     08/25/18 2021   08/25/18 1743  SARS CORONAVIRUS 2 Nasal Swab Aptima Multi Swab  (Asymptomatic/Tier 2 Patients Labs)  Once,   STAT    Question Answer Comment  Is this test for diagnosis or screening Screening   Symptomatic for COVID-19 as defined by CDC No   Hospitalized for COVID-19 No   Admitted to ICU for COVID-19 No   Previously tested for COVID-19 Yes   Resident in a congregate (group) care setting No   Employed in healthcare setting No   Pregnant No      08/25/18 1742          Vitals/Pain Today's Vitals   08/25/18 1910 08/25/18 1915 08/25/18 1930 08/25/18 1945  BP: 121/66 127/70 138/67 138/66  Pulse:  94 93 92  Resp: 18 (!) 21 20 13   Temp:      TempSrc:  SpO2:  97% 96% 97%  PainSc:         Isolation Precautions No active isolations  Medications Medications  enoxaparin (LOVENOX) injection 40 mg (has no administration in time range)  sodium chloride flush (NS) 0.9 % injection 3 mL (has no administration in time range)  acetaminophen (TYLENOL) tablet 650 mg (has no administration in time range)    Or  acetaminophen (TYLENOL) suppository 650 mg (has no administration in time range)  ondansetron (ZOFRAN) tablet 4 mg (has no administration in time range)    Or  ondansetron (ZOFRAN) injection 4 mg (has no administration in time range)  insulin aspart (novoLOG) injection 0-9 Units (has no administration in time range)  lactated ringers bolus 1,000 mL (1,000 mLs Intravenous New Bag/Given 08/25/18 1721)    Mobility walks Low fall risk   Focused Assessments Neuro Assessment Handoff:  Swallow screen pass? N/A         Neuro Assessment:   Neuro Checks:      Last Documented NIHSS Modified Score:   Has TPA been given? No If patient is a Neuro Trauma and patient is going to OR before floor call report to [place] nurse: [phone] or [phone]     R Recommendations: See Admitting Provider Note  Report given to:   Additional Notes:  Pt speaks primarily spanish

## 2018-08-25 NOTE — ED Provider Notes (Signed)
I saw and evaluated the patient, reviewed the resident's note and I agree with the findings and plan.  EKG:    69 year old female presents with trouble swallowing times several days.  Denies any airway involvement.  Does have a history of dermatomyositis with recent hospitalization for same.  She appears clinically dehydrated.  We will hydrate here, check labs and likely admit   Lacretia Leigh, MD 08/25/18 [phone]

## 2018-08-25 NOTE — Telephone Encounter (Addendum)
Pt's daughter called back - stated her was recently discharged from the hospital. And she cannot eat or drink so she cannot take her medications. And her legs are weak; almost had a fall on Saturday. Offered/first available appt for tomorrow  but daughter thinks she needs to be seen this morning so she will take her to the ER.

## 2018-08-25 NOTE — Telephone Encounter (Signed)
Pt's daughter requesting to speak with a nurse, states her mother is having difficulty swallowing food. Please call back.

## 2018-08-25 NOTE — ED Provider Notes (Signed)
[facility] EMERGENCY DEPARTMENT Provider Note   CSN: [id number] Arrival date & time: 08/25/18  1209    History   Chief Complaint Chief Complaint  Patient presents with  . Dysphagia    HPI SACOYA MCGOURTY is a 68 y.o. female with recent diagnosis of dermatomyositis presented to the ED complaining of progressively worsening dysphagia over the past 3 to 4 days.  Patient reports her symptoms began with difficulty swallowing solids and have since progressed to the point where she cannot tolerate liquids by mouth.  Patient reports she is unable to take her medications that were prescribed to her during her recent admission.  Patient reports she has been unable to eat or drink anything for the past 2 days.  Patient denies having any difficulty breathing.  She describes her difficulty swallowing as feeling like the food will not go down when she tries to initiate the swallow.  Patient is also complaining of the proximal muscle pain, weakness, and rash from her recently diagnosed dermatomyositis.  She denies any nausea or vomiting.  She denies any fever, chills, cough, shortness of breath, voice changes, or any other complaints.     The history is provided by the patient and the spouse. A language interpreter was used.    Past Medical History:  Diagnosis Date  . Cholelithiasis   . Dermatomycosis   . Gallstones 08/08/2010  . GERD (gastroesophageal reflux disease)    pt denies having GERD on day of surgery  . Hyperlipidemia   . Nausea & vomiting     Patient Active Problem List   Diagnosis Date Noted  . Transaminitis 08/26/2018  . Dysphagia 08/25/2018  . 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 Encounter for screening colonoscopy 03/24/2015  . Essential tremor 02/06/2015  . Hyperlipidemia 10/28/2014  . GERD (gastroesophageal reflux disease) 10/28/2014  . Hemorrhoids 10/28/2014  . Gallstones 08/08/2010    Past Surgical  History:  Procedure Laterality Date  . CHOLECYSTECTOMY  08/08/2010   Procedure: LAPAROSCOPIC CHOLECYSTECTOMY;  Surgeon: Scherry Ran;  [facility];  Service: General;  Laterality: N/A;  . COLONOSCOPY N/A 04/15/2015   Procedure: COLONOSCOPY;  Surgeon: Danie Binder, MD;  [facility];  Service: Endoscopy;  Laterality: N/A;  1015 - moved to 10:00 - office to notify - interpreter scheduled, do NOT change time  . INSERTION OF MESH N/A 01/23/2018   Procedure: INSERTION OF MESH;  Surgeon: Donnie Mesa, MD;  Location: [place];  Service: General;  Laterality: N/A;  . UMBILICAL HERNIA REPAIR N/A 01/23/2018   Procedure: UMBILICAL HERNIA REPAIR WITH MESH;  Surgeon: Donnie Mesa, MD;  Location: [place];  Service: General;  Laterality: N/A;  . VARICOSE VEIN SURGERY  2011   [facility]     OB History   No obstetric history on file.      Home Medications    Prior to Admission medications   Medication Sig Start Date End Date Taking? Authorizing Provider  Acetylcysteine (NAC PO) Take 1,000 mg by mouth 3 (three) times daily.   Yes [provider]  amitriptyline (ELAVIL) 25 MG tablet Take 25 mg by mouth at bedtime as needed (muscle and joint pain).  08/06/18  Yes [provider]  clobetasol ointment (TEMOVATE) 0.05 % Apply topically 2 (two) times daily. Patient taking differently: Apply 1 application topically 2 (two) times daily as needed (itching).  08/15/18  Yes Earlene Plater, MD  doxepin [facility]) 25  MG capsule Take 25 mg by mouth 3 (three) times daily. 07/11/18  Yes [provider]  hydrocortisone cream 1 % Apply topically at bedtime as needed for itching. Patient taking differently: Apply 1 application topically at bedtime as needed for itching.  08/15/18  Yes Earlene Plater, MD  hydrOXYzine (ATARAX/VISTARIL) 25 MG tablet Take 1 tablet (25 mg total) by mouth every 8 (eight) hours as needed for itching. 08/02/18  Yes  Horton, Barbette Hair, MD  pantoprazole (PROTONIX) 40 MG tablet Take 1 tablet (40 mg total) by mouth daily. 08/16/18  Yes Earlene Plater, MD  predniSONE (DELTASONE) 10 MG tablet Take 7 tablets (70 mg total) by mouth daily with breakfast. 08/16/18  Yes Earlene Plater, MD  senna-docusate (SENOKOT-S) 8.6-50 MG tablet Take 1 tablet by mouth at bedtime as needed for mild constipation or moderate constipation. 08/15/18 09/14/18 Yes Earlene Plater, MD  Vitamin D, Ergocalciferol, (DRISDOL) 1.25 MG (50000 UT) CAPS capsule Take 50,000 Units by mouth every Wednesday.   Yes [provider]    Family History Family History  Problem Relation Age of Onset  . Colon cancer Neg Hx     Social History Social History   Tobacco Use  . Smoking status: Never Smoker  . Smokeless tobacco: Never Used  Substance Use Topics  . Alcohol use: No  . Drug use: No     Allergies   Patient has no known allergies.   Review of Systems Review of Systems  Constitutional: Negative for chills and fever.  HENT: Positive for trouble swallowing. Negative for ear pain, sore throat and voice change.   Eyes: Negative for pain and visual disturbance.  Respiratory: Negative for cough and shortness of breath.   Cardiovascular: Negative for chest pain and palpitations.  Gastrointestinal: Negative for abdominal pain and vomiting.  Genitourinary: Negative for dysuria and hematuria.  Musculoskeletal: Positive for myalgias. Negative for arthralgias and back pain.  Skin: Positive for rash. Negative for color change.  Neurological: Positive for weakness. Negative for seizures and syncope.  All other systems reviewed and are negative.    Physical Exam Updated Vital Signs BP 127/65 (BP Location: Left Arm)   Pulse 87   Temp 98.6 F (37 C) (Oral)   Resp 13   Wt 71.5 kg   SpO2 96%   BMI 25.44 kg/m   Physical Exam Vitals signs and nursing note reviewed.  Constitutional:      General: She is not in acute  distress.    Appearance: She is obese. She is not ill-appearing, toxic-appearing or diaphoretic.  HENT:     Head: Normocephalic and atraumatic.     Nose: Nose normal. No congestion or rhinorrhea.     Mouth/Throat:     Mouth: Mucous membranes are moist.     Pharynx: Oropharynx is clear. No oropharyngeal exudate or posterior oropharyngeal erythema.  Eyes:     Extraocular Movements: Extraocular movements intact.     Conjunctiva/sclera: Conjunctivae normal.     Pupils: Pupils are equal, round, and reactive to light.  Neck:     Musculoskeletal: Normal range of motion and neck supple. No muscular tenderness.  Cardiovascular:     Rate and Rhythm: Normal rate and regular rhythm.     Pulses: Normal pulses.     Heart sounds: Normal heart sounds. No murmur. No friction rub. No gallop.   Pulmonary:     Effort: Pulmonary effort is normal. No respiratory distress.     Breath sounds: Normal breath sounds. No stridor. No wheezing,  rhonchi or rales.  Chest:     Chest wall: No tenderness.  Abdominal:     General: Abdomen is flat. There is no distension.     Palpations: Abdomen is soft.     Tenderness: There is no abdominal tenderness. There is no guarding or rebound.  Musculoskeletal: Normal range of motion.        General: No swelling, tenderness, deformity or signs of injury.  Skin:    General: Skin is warm and dry.     Findings: Rash (Diffuse rash to face, upper extremities, and trunk) present.  Neurological:     General: No focal deficit present.     Mental Status: She is alert and oriented to person, place, and time. Mental status is at baseline.     Cranial Nerves: No cranial nerve deficit.     Sensory: No sensory deficit.     Motor: No weakness.      ED Treatments / Results  Labs (all labs ordered are listed, but only abnormal results are displayed) Labs Reviewed  COMPREHENSIVE METABOLIC PANEL - Abnormal; Notable for the following components:      Result Value   Glucose, Bld 126  (*)    Calcium 8.7 (*)    Total Protein 6.4 (*)    Albumin 3.0 (*)    AST 626 (*)    ALT 235 (*)    All other components within normal limits  CBC WITH DIFFERENTIAL/PLATELET - Abnormal; Notable for the following components:   MCV 100.2 (*)    Abs Immature Granulocytes 0.14 (*)    All other components within normal limits  CK - Abnormal; Notable for the following components:   Total CK 8,335 (*)    All other components within normal limits  SARS CORONAVIRUS 2  CK  COMPREHENSIVE METABOLIC PANEL  HEPATITIS B CORE ANTIBODY, TOTAL  HEPATITIS B SURFACE ANTIBODY, QUANTITATIVE  HEPATITIS B SURFACE ANTIGEN  HEPATITIS B CORE ANTIBODY, IGM  PROTIME-INR    EKG None  Radiology No results found.  Procedures Procedures (including critical care time)    Initial Impression / Assessment and Plan / ED Course  I have reviewed the triage vital signs and the nursing notes.  Pertinent labs & imaging results that were available during my care of the patient were reviewed by me and considered in my medical decision making (see chart for details).        TOMESHIA PIZZI is a 68 y.o. female with recent diagnosis of dermatomyositis presented to the ED complaining of progressively worsening dysphagia over the past 3 to 4 days.  Patient is unable to tolerate solids or liquids at this time.  On arrival to the ED, she is tachycardic to the 120s and appears volume depleted.  There is no concern for airway involvement based on my assessment.  Patient was given 1 L of LR with improvement of her tachycardia.  CMP significant for elevation of AST to 626 and ALT to 235.  CBC is unremarkable.  CK elevated at 8335.  Patient's LFT elevation and CK elevation are similar to prior admission for dermatomyositis.  Medicine was contacted for admission for further management of patient's dermatomyositis and dysphagia.  Patient is unable to tolerate her medications or any p.o. intake at home.  Patient likely will  require either an esophagram or EGD while inpatient.  Patient was admitted to medicine.  Final Clinical Impressions(s) / ED Diagnoses   Final diagnoses:  Dysphagia  Transaminitis    ED Discharge  Orders    None       Candie Chroman, MD 08/26/18 0228    Lacretia Leigh, MD 08/26/18 [phone]

## 2018-08-25 NOTE — ED Triage Notes (Signed)
Pt states this am she was unable to swallow, even water.  Denies pain or difficulty breathing.  Airway patent.

## 2018-08-26 ENCOUNTER — Ambulatory Visit: Payer: Self-pay

## 2018-08-26 ENCOUNTER — Inpatient Hospital Stay (HOSPITAL_COMMUNITY): Payer: Medicaid Other

## 2018-08-26 DIAGNOSIS — R7989 Other specified abnormal findings of blood chemistry: Secondary | ICD-10-CM

## 2018-08-26 DIAGNOSIS — R1310 Dysphagia, unspecified: Secondary | ICD-10-CM

## 2018-08-26 DIAGNOSIS — M6282 Rhabdomyolysis: Secondary | ICD-10-CM

## 2018-08-26 DIAGNOSIS — M3310 Other dermatopolymyositis, organ involvement unspecified: Secondary | ICD-10-CM

## 2018-08-26 DIAGNOSIS — R7401 Elevation of levels of liver transaminase levels: Secondary | ICD-10-CM | POA: Diagnosis present

## 2018-08-26 DIAGNOSIS — R748 Abnormal levels of other serum enzymes: Secondary | ICD-10-CM

## 2018-08-26 LAB — COMPREHENSIVE METABOLIC PANEL
ALT: 191 U/L — ABNORMAL HIGH (ref 0–44)
AST: 529 U/L — ABNORMAL HIGH (ref 15–41)
Albumin: 2.3 g/dL — ABNORMAL LOW (ref 3.5–5.0)
Alkaline Phosphatase: 39 U/L (ref 38–126)
Anion gap: 12 (ref 5–15)
BUN: 18 mg/dL (ref 8–23)
CO2: 23 mmol/L (ref 22–32)
Calcium: 8.1 mg/dL — ABNORMAL LOW (ref 8.9–10.3)
Chloride: 104 mmol/L (ref 98–111)
Creatinine, Ser: 0.49 mg/dL (ref 0.44–1.00)
GFR calc Af Amer: >60 mL/min (ref >60)
GFR calc non Af Amer: >60 mL/min (ref >60)
Glucose, Bld: 139 mg/dL — ABNORMAL HIGH (ref 70–99)
Potassium: 4.1 mmol/L (ref 3.5–5.1)
Sodium: 139 mmol/L (ref 135–145)
Total Bilirubin: 0.9 mg/dL (ref 0.3–1.2)
Total Protein: 5.4 g/dL — ABNORMAL LOW (ref 6.5–8.1)

## 2018-08-26 LAB — GLUCOSE, CAPILLARY
Glucose-Capillary: 97 mg/dL (ref 70–99)
Glucose-Capillary: 132 mg/dL — ABNORMAL HIGH (ref 70–99)
Glucose-Capillary: 111 mg/dL — ABNORMAL HIGH (ref 70–99)
Glucose-Capillary: 76 mg/dL (ref 70–99)
Glucose-Capillary: 90 mg/dL (ref 70–99)

## 2018-08-26 LAB — PROTIME-INR
INR: 1 (ref 0.8–1.2)
Prothrombin Time: 13.5 seconds (ref 11.4–15.2)

## 2018-08-26 LAB — CK: Total CK: 7345 U/L — ABNORMAL HIGH (ref 38–234)

## 2018-08-26 LAB — SARS CORONAVIRUS 2 (TAT 6-24 HRS): SARS Coronavirus 2: NEGATIVE

## 2018-08-26 MED ORDER — DEXTROSE-NACL 5-0.9 % IV SOLN
INTRAVENOUS | Status: DC
  Administered 2018-08-26 – 2018-08-29 (×6): via INTRAVENOUS

## 2018-08-26 NOTE — Evaluation (Signed)
Clinical/Bedside Swallow Evaluation Patient Details  Name: Lisa Mcdowell MRN: [id number] Date of Birth: 02-02-1950  Today's Date: 08/26/2018 Time: SLP Start Time (ACUTE ONLY): 0900 SLP Stop Time (ACUTE ONLY): 0927 SLP Time Calculation (min) (ACUTE ONLY): 27 min  Past Medical History:  Past Medical History:  Diagnosis Date  . Cholelithiasis   . Dermatomycosis   . Gallstones 08/08/2010  . GERD (gastroesophageal reflux disease)    pt denies having GERD on day of surgery  . Hyperlipidemia   . Nausea & vomiting    Past Surgical History:  Past Surgical History:  Procedure Laterality Date  . CHOLECYSTECTOMY  08/08/2010   Procedure: LAPAROSCOPIC CHOLECYSTECTOMY;  Surgeon: Scherry Ran;  [facility];  Service: General;  Laterality: N/A;  . COLONOSCOPY N/A 04/15/2015   Procedure: COLONOSCOPY;  Surgeon: Danie Binder, MD;  [facility];  Service: Endoscopy;  Laterality: N/A;  1015 - moved to 10:00 - office to notify - interpreter scheduled, do NOT change time  . INSERTION OF MESH N/A 01/23/2018   Procedure: INSERTION OF MESH;  Surgeon: Donnie Mesa, MD;  Location: [place];  Service: General;  Laterality: N/A;  . UMBILICAL HERNIA REPAIR N/A 01/23/2018   Procedure: UMBILICAL HERNIA REPAIR WITH MESH;  Surgeon: Donnie Mesa, MD;  Location: [place];  Service: General;  Laterality: N/A;  . VARICOSE VEIN SURGERY  2011   [facility]   HPI:  This is a 68 year old female with a PMH significant for dermatomysitis which appears to be a fairly new diagnosis for her. Patient was recently hospitalized for 7/26-7/31 for dermatomyositis and was subsequently treated with IV steroids.  She is discharged with p.o. prednisone.  Patient presented to the ED with a 4 day h/o progressively worsening dysphagia characterized by globus.    Assessment / Plan / Recommendation Clinical Impression  Patient presents with evidence of a pharyngeal and suspected esophageal  phase dysphagia characterized by c/o throat swelling and globus across po trials. Patient with excessive lingual pumping but no obvious evidence of a pharyngeal swallow initiated with po trials, instead expectorating all boluses provided. Patient's vocal quality however is clear and patient able to initiate a dry swallow (with some difficulty) indicating that patient's swallow is intact enough to handle secretions. Discussed with MD. Unable to determine at beside exact origin of dysphagia. Suspect that paitent is experiencing some combination of edema (pharyngeal, esophageal) and possible muscle weakness to contribute. MBS will be beneficial to assist in determining exact origin of difficulty. Cannot be completed today but will plan for MBS 8/12 am. Would allow small amounts of ice chips after oral care to facilitate hydration of oral and pharyngeal mucosa pending MBS completion. Per MD, plans for for potential EGD as well which will be helpful with diagnosis. Will f/u.  SLP Visit Diagnosis: Dysphagia, pharyngoesophageal phase (R13.14)    Aspiration Risk  Risk for inadequate nutrition/hydration(TBD)    Diet Recommendation NPO;Ice chips PRN after oral care   Medication Administration: Via alternative means    Other  Recommendations Oral Care Recommendations: Oral care QID;Oral care prior to ice chip/H20   Follow up Recommendations (TBD)             Prognosis Prognosis for Safe Diet Advancement: Fair      Swallow Study   General HPI: This is a 68 year old female with a PMH significant for dermatomysitis which appears to be a fairly new diagnosis for her. Patient was recently hospitalized for  7/26-7/31 for dermatomyositis and was subsequently treated with IV steroids.  She is discharged with p.o. prednisone.  Patient presented to the ED with a 4 day h/o progressively worsening dysphagia characterized by globus.  Type of Study: Bedside Swallow Evaluation Previous Swallow Assessment: NONE  NOTED Diet Prior to this Study: NPO Temperature Spikes Noted: No Respiratory Status: Room air History of Recent Intubation: No Behavior/Cognition: Alert;Cooperative;Pleasant mood Oral Cavity Assessment: Dry;Other (comment)(white lingual coating, partially cleared with ice chips) Oral Care Completed by SLP: Recent completion by staff Oral Cavity - Dentition: Dentures, top;Dentures, bottom(not in place for evaluation) Vision: Functional for self-feeding Self-Feeding Abilities: Able to feed self Patient Positioning: Upright in bed Baseline Vocal Quality: Normal Volitional Cough: Strong Volitional Swallow: Able to elicit(with difficulty)    Oral/Motor/Sensory Function Overall Oral Motor/Sensory Function: Within functional limits   Ice Chips Ice chips: Impaired Presentation: Spoon Pharyngeal Phase Impairments: Unable to trigger swallow   Thin Liquid Thin Liquid: Impaired Presentation: Spoon Pharyngeal  Phase Impairments: Unable to trigger swallow    Nectar Thick Nectar Thick Liquid: Not tested   Honey Thick Honey Thick Liquid: Not tested   Puree Puree: Impaired Presentation: Spoon Pharyngeal Phase Impairments: Unable to trigger swallow   Solid     Solid: Not tested     Gabriel Rainwater MA, CCC-SLP   [place] Berkel Meryl 08/26/2018,9:34 AM

## 2018-08-26 NOTE — Progress Notes (Signed)
Subjective:   Patient states she continues to have difficulty swallowing that began 4 days ago and has progressed to the point she can no longer swallow food or liquids. This became very concerning when she could no longer take her medication.  She denies pain with swallowing, sore throat, shortness of breath, chest pain.  She is also still have muscle weakness and pain is exacerbated by movement, especially in the left upper extremity and right lower extremity.  She is unable to comb her hair with left upper extremity but can with her right.  She notes she can walk up the stairs at home although very slowly.  Objective:  Vital signs in last 24 hours: Vitals:   08/26/18 0012 08/26/18 0410 08/26/18 0855 08/26/18 1245  BP: 127/65 120/72 124/77 124/71  Pulse: 87 80 80 89  Resp: 13 13 20 18   Temp: 98.6 F (37 C) 98.1 F (36.7 C) 98.1 F (36.7 C) 98 F (36.7 C)  TempSrc: Oral Oral Oral Oral  SpO2: 96% 97% 98% 98%  Weight:       Physical Exam Vitals signs and nursing note reviewed.  Constitutional:      Appearance: She is normal weight.  HENT:     Head: Normocephalic and atraumatic.  Cardiovascular:     Rate and Rhythm: Normal rate and regular rhythm.     Heart sounds: No murmur.  Pulmonary:     Effort: Pulmonary effort is normal. No respiratory distress.     Breath sounds: No wheezing or rales.  Abdominal:     General: Bowel sounds are normal.     Palpations: Abdomen is soft.  Musculoskeletal:     Comments: Upper extremities: Proximal muscle weakness more notable in the left extremity.  Her distal muscles are 5 out of 5 bilaterally.  Lower extremities: Distal muscle strength 5 out of 5 bilaterally.  Proximal muscle strength will need to be reevaluated.  Skin:    General: Skin is warm and dry.  Neurological:     General: No focal deficit present.     Mental Status: She is alert.    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  She describes a feeling as though item would not go down, may get stuck.  In light of her recent diagnosis of dermatomyositis with significant muscle involvement, likely that dysphagia is connected.  Per up-to-date, dysphasia is more common to present with dermatomyositis and older populations.  In addition, when dermatomyositis presents with dysphasia, there is a higher incidence of malignancy.  SLP is on board and evaluated patient today, and noted difficulty differentiating between pharyngeal versus esophageal dysphasia due to patient unable to tolerate trials.  They have planned a modified barium swallow for tomorrow morning.  Additionally, patient will benefit from an EGD for further evaluation.  GI has been consulted.  For further clarification and their recommendations, will reach out to rheumatologist at [facility] tomorrow morning.  - GI consulted  - MBS tomorrow AM  #Secondary Rhadomyolysis #Elevated LFTs Secondary to dermatomyositis.  Elevated CK may be contributing significantly to elevated AST.  Worsened by patient's inability to take prednisone the last few days.  Abdominal ultrasound showed hepatic steatosis, no other abnormal findings.  Will resume steroid treatment with Solu-Medrol.   - Trend LFTs - Trend  CK - IV Fluids      Dispo: Anticipated discharge pending medical improvement  Dr. Jose Persia Internal Medicine PGY-1  Pager: [phone] 08/26/2018, 4:33 PM

## 2018-08-26 NOTE — Consult Note (Signed)
Referring Provider:  Dr. Joni Reining Primary Care Physician:  Department, [facility] Primary Gastroenterologist:  Dr. Oneida Alar  Reason for Consultation:  Dysphagia  HPI: Lisa Mcdowell is a 68 y.o. female who was recently diagnosed with dermatomyositis.  She was hospitalized for this in July and started on IV steroids.  Was discharged home on p.o. steroids.  She presented back to [facility] on August 10 with complaints of dysphasia that began 3 to 4 days prior to admission and had significantly worsened during that short time period.  At this point she is not able to swallow much by mouth at all including sips of water.  She has now been restarted back on IV steroids.  Had a bedside swallow study today and speech and language pathology is planning a modified barium swallow study for tomorrow.  Also noted to have significantly elevated LFTs, AST and ALT.   Past Medical History:  Diagnosis Date   Cholelithiasis    Dermatomycosis    Gallstones 08/08/2010   GERD (gastroesophageal reflux disease)    pt denies having GERD on day of surgery   Hyperlipidemia    Nausea & vomiting     Past Surgical History:  Procedure Laterality Date   CHOLECYSTECTOMY  08/08/2010   Procedure: LAPAROSCOPIC CHOLECYSTECTOMY;  Surgeon: Scherry Ran;  Location: [facility] ORS;  Service: General;  Laterality: N/A;   COLONOSCOPY N/A 04/15/2015   Procedure: COLONOSCOPY;  Surgeon: Danie Binder, MD;  Location: [facility] ENDO SUITE;  Service: Endoscopy;  Laterality: N/A;  1015 - moved to 10:00 - office to notify - interpreter scheduled, do NOT change time   [place] N/A 01/23/2018   Procedure: INSERTION OF MESH;  Surgeon: Donnie Mesa, MD;  Location: [place];  Service: General;  Laterality: N/A;   UMBILICAL HERNIA REPAIR N/A 01/23/2018   Procedure: UMBILICAL HERNIA REPAIR WITH MESH;  Surgeon: Donnie Mesa, MD;  Location: [place];  Service: General;   Laterality: N/A;   [place]    Prior to Admission medications   Medication Sig Start Date End Date Taking? Authorizing Provider  Acetylcysteine (NAC PO) Take 1,000 mg by mouth 3 (three) times daily.   Yes [provider]  amitriptyline (ELAVIL) 25 MG tablet Take 25 mg by mouth at bedtime as needed (muscle and joint pain).  08/06/18  Yes [provider]  clobetasol ointment (TEMOVATE) 0.05 % Apply topically 2 (two) times daily. Patient taking differently: Apply 1 application topically 2 (two) times daily as needed (itching).  08/15/18  Yes Earlene Plater, MD  doxepin (SINEQUAN) 25 MG capsule Take 25 mg by mouth 3 (three) times daily. 07/11/18  Yes [provider]  hydrocortisone cream 1 % Apply topically at bedtime as needed for itching. Patient taking differently: Apply 1 application topically at bedtime as needed for itching.  08/15/18  Yes Earlene Plater, MD  hydrOXYzine (ATARAX/VISTARIL) 25 MG tablet Take 1 tablet (25 mg total) by mouth every 8 (eight) hours as needed for itching. 08/02/18  Yes Horton, Barbette Hair, MD  pantoprazole (PROTONIX) 40 MG tablet Take 1 tablet (40 mg total) by mouth daily. 08/16/18  Yes Earlene Plater, MD  predniSONE (DELTASONE) 10 MG tablet Take 7 tablets (70 mg total) by mouth daily with breakfast. 08/16/18  Yes Earlene Plater, MD  senna-docusate (SENOKOT-S) 8.6-50 MG tablet Take 1 tablet by mouth at bedtime as needed for mild constipation or moderate constipation. 08/15/18 09/14/18  Yes Earlene Plater, MD  Vitamin D, Ergocalciferol, (DRISDOL) 1.25 MG (50000 UT) CAPS capsule Take 50,000 Units by mouth every Wednesday.   Yes [provider]    Current Facility-Administered Medications  Medication Dose Route Frequency Provider Last Rate Last Dose   0.9 %  sodium chloride infusion   Intravenous Continuous Welford Roche, MD 100 mL/hr at 08/26/18 1020     acetaminophen (TYLENOL)  suppository 650 mg  650 mg Rectal Q6H PRN Welford Roche, MD       enoxaparin (LOVENOX) injection 40 mg  40 mg Subcutaneous Q24H Welford Roche, MD   40 mg at 08/25/18 2212   insulin aspart (novoLOG) injection 0-9 Units  0-9 Units Subcutaneous TID WC Welford Roche, MD   1 Units at 08/26/18 0806   methylPREDNISolone sodium succinate (SOLU-MEDROL) 125 mg/2 mL injection 60 mg  60 mg Intravenous Q24H Welford Roche, MD   60 mg at 08/25/18 2213   ondansetron (ZOFRAN) tablet 4 mg  4 mg Oral Q6H PRN Welford Roche, MD       Or   ondansetron (ZOFRAN) injection 4 mg  4 mg Intravenous Q6H PRN Santos-Sanchez, Merlene Morse, MD       sodium chloride flush (NS) 0.9 % injection 3 mL  3 mL Intravenous Q12H Welford Roche, MD   3 mL at 08/26/18 1021    Allergies as of 08/25/2018   (No Known Allergies)    Family History  Problem Relation Age of Onset   Colon cancer Neg Hx     Social History   Socioeconomic History   Marital status: Married    Spouse name: Not on file   Number of children: Not on file   Years of education: Not on file   Highest education level: Not on file  Occupational History   Not on file  Social Needs   Financial resource strain: Not on file   Food insecurity    Worry: Not on file    Inability: Not on file   Transportation needs    Medical: Not on file    Non-medical: Not on file  Tobacco Use   Smoking status: Never Smoker   Smokeless tobacco: Never Used  Substance and Sexual Activity   Alcohol use: No   Drug use: No   Sexual activity: Not on file  Lifestyle   Physical activity    Days per week: Not on file    Minutes per session: Not on file   Stress: Not on file  Relationships   Social connections    Talks on phone: Not on file    Gets together: Not on file    Attends religious service: Not on file    Active member of club or organization: Not on file    Attends meetings of clubs or  organizations: Not on file    Relationship status: Not on file   Intimate partner violence    Fear of current or ex partner: Not on file    Emotionally abused: Not on file    Physically abused: Not on file    Forced sexual activity: Not on file  Other Topics Concern   Not on file  Social History Narrative   Not on file    Review of Systems: ROS is O/W negative except as mentioned in HPI.  Physical Exam: Vital signs in last 24 hours: Temp:  [98 F (36.7 C)-99.2 F (37.3 C)] 98 F (36.7 C) (08/11 1245) Pulse Rate:  [80-104] 89 (08/11 1245) Resp:  [  13-21] 18 (08/11 1245) BP: (119-138)/(61-78) 124/71 (08/11 1245) SpO2:  [96 %-100 %] 98 % (08/11 1245) Weight:  [71.5 kg] 71.5 kg (08/10 2132) Last BM Date: 08/24/18 General:  Alert, Well-developed, well-nourished, pleasant and cooperative in NAD.  Tearful. Head:  Normocephalic and atraumatic. Eyes:  Sclera clear, no icterus.  Conjunctiva pink. Ears:  Normal auditory acuity. Mouth:  No deformity or lesions.   Lungs:  Clear throughout to auscultation.  No wheezes, crackles, or rhonchi.  Heart:  Regular rate and rhythm; no murmurs, clicks, rubs, or gallops. Abdomen:  Soft, non-distended.  BS present.  Non-tender. Msk:  Symmetrical without gross deformities. Pulses:  Normal pulses noted. Extremities:  Without clubbing or edema. Neurologic:  Alert and oriented x 4;  grossly normal neurologically. Psych:  Alert and cooperative. Normal mood and affect.  Intake/Output from previous day: 08/10 0701 - 08/11 0700 In: 761.2 [I.V.:761.2] Out: -   Lab Results: Recent Labs    08/25/18 1720  WBC 8.4  HGB 14.9  HCT 44.9  PLT 224   BMET Recent Labs    08/25/18 1720 08/26/18 0402  NA 138 139  K 4.1 4.1  CL 102 104  CO2 23 23  GLUCOSE 126* 139*  BUN 21 18  CREATININE 0.57 0.49  CALCIUM 8.7* 8.1*   LFT Recent Labs    08/26/18 0402  PROT 5.4*  ALBUMIN 2.3*  AST 529*  ALT 191*  ALKPHOS 39  BILITOT 0.9    PT/INR Recent Labs    08/26/18 0402  LABPROT 13.5  INR 1.0   Studies/Results: Dg Chest 2 View  Result Date: 08/26/2018 CLINICAL DATA:  Progressive worsening dysphagia. EXAM: CHEST - 2 VIEW COMPARISON:  February 16, 2016 FINDINGS: Scarring is seen in the right apex. Tortuous thoracic aorta is stable. The heart, hila, and mediastinum are unchanged. Mild atelectasis in the bases. No other acute abnormalities. IMPRESSION: No active cardiopulmonary disease. Electronically Signed   By: Dorise Bullion III M.D   On: 08/26/2018 10:02   US Abdomen Limited Ruq  Result Date: 08/26/2018 CLINICAL DATA:  Transaminitis EXAM: ULTRASOUND ABDOMEN LIMITED RIGHT UPPER QUADRANT COMPARISON:  12/16/2014 abdominal CT FINDINGS: Gallbladder: Cholecystectomy. Common bile duct: Diameter: 4 mm at the hilum. The distal common bile duct is obscured. Liver: Echogenic liver with diminished acoustic penetration. Portal vein is patent on color Doppler imaging with normal direction of blood flow towards the liver. IMPRESSION: 1. Hepatic steatosis. 2. Cholecystectomy with normal common bile duct diameter at the hilum. Electronically Signed   By: Monte Fantasia M.D.   On: 08/26/2018 06:55   IMPRESSION:  *68 year old female with recent diagnosis of dermatomyositis on treatmetn with steroids who presented to [facility] ED with inability to swallow.  Dysphagia is a very common issue with DM due to weakness of the striated muscle of the upper 1/3 of the esophagus and/or the oropharyngeal muscles.  Had BS swallow study today and plan is for [facility] tomorrow.   *Elevated LFT's:  AST and ALT quite high.  Undoubtedly due to the dermatomyositis as well.  PLAN: *Await MBS results. *Continue treatment of underlying dermatomyositis.  Teaching service may want to attempt contacting rheum specialist in [place]. *Trend LFT's.  Janett Billow D. Ellarie Picking  08/26/2018, 1:36 PM

## 2018-08-27 ENCOUNTER — Inpatient Hospital Stay (HOSPITAL_COMMUNITY): Payer: Medicaid Other

## 2018-08-27 LAB — GLUCOSE, CAPILLARY
Glucose-Capillary: 116 mg/dL — ABNORMAL HIGH (ref 70–99)
Glucose-Capillary: 118 mg/dL — ABNORMAL HIGH (ref 70–99)
Glucose-Capillary: 139 mg/dL — ABNORMAL HIGH (ref 70–99)
Glucose-Capillary: 169 mg/dL — ABNORMAL HIGH (ref 70–99)
Glucose-Capillary: 170 mg/dL — ABNORMAL HIGH (ref 70–99)
Glucose-Capillary: 86 mg/dL (ref 70–99)
Glucose-Capillary: 91 mg/dL (ref 70–99)

## 2018-08-27 LAB — COMPREHENSIVE METABOLIC PANEL
ALT: 174 U/L — ABNORMAL HIGH (ref 0–44)
AST: 465 U/L — ABNORMAL HIGH (ref 15–41)
Albumin: 2.3 g/dL — ABNORMAL LOW (ref 3.5–5.0)
Alkaline Phosphatase: 39 U/L (ref 38–126)
Anion gap: 8 (ref 5–15)
BUN: 19 mg/dL (ref 8–23)
CO2: 23 mmol/L (ref 22–32)
Calcium: 8 mg/dL — ABNORMAL LOW (ref 8.9–10.3)
Chloride: 107 mmol/L (ref 98–111)
Creatinine, Ser: 0.44 mg/dL (ref 0.44–1.00)
GFR calc Af Amer: >60 mL/min (ref >60)
GFR calc non Af Amer: >60 mL/min (ref >60)
Glucose, Bld: 184 mg/dL — ABNORMAL HIGH (ref 70–99)
Potassium: 3.8 mmol/L (ref 3.5–5.1)
Sodium: 138 mmol/L (ref 135–145)
Total Bilirubin: 0.8 mg/dL (ref 0.3–1.2)
Total Protein: 5.2 g/dL — ABNORMAL LOW (ref 6.5–8.1)

## 2018-08-27 LAB — CBC WITH DIFFERENTIAL/PLATELET
Abs Immature Granulocytes: 0.07 10*3/uL (ref 0.00–0.07)
Basophils Absolute: 0 10*3/uL (ref 0.0–0.1)
Basophils Relative: 0 %
Eosinophils Absolute: 0 10*3/uL (ref 0.0–0.5)
Eosinophils Relative: 0 %
HCT: 38.7 % (ref 36.0–46.0)
Hemoglobin: 12.8 g/dL (ref 12.0–15.0)
Immature Granulocytes: 1 %
Lymphocytes Relative: 7 %
Lymphs Abs: 0.6 10*3/uL — ABNORMAL LOW (ref 0.7–4.0)
MCH: 33 pg (ref 26.0–34.0)
MCHC: 33.1 g/dL (ref 30.0–36.0)
MCV: 99.7 fL (ref 80.0–100.0)
Monocytes Absolute: 0.1 10*3/uL (ref 0.1–1.0)
Monocytes Relative: 2 %
Neutro Abs: 7.9 10*3/uL — ABNORMAL HIGH (ref 1.7–7.7)
Neutrophils Relative %: 90 %
Platelets: 199 10*3/uL (ref 150–400)
RBC: 3.88 MIL/uL (ref 3.87–5.11)
RDW: 13.7 % (ref 11.5–15.5)
WBC: 8.7 10*3/uL (ref 4.0–10.5)
nRBC: 0 % (ref 0.0–0.2)

## 2018-08-27 LAB — HEPATITIS B SURFACE ANTIBODY, QUANTITATIVE: Hep B S AB Quant (Post): <3.1 m[IU]/mL — ABNORMAL LOW (ref >9.9)

## 2018-08-27 LAB — HEPATITIS B CORE ANTIBODY, TOTAL: Hep B Core Total Ab: NEGATIVE

## 2018-08-27 LAB — CK: Total CK: 6152 U/L — ABNORMAL HIGH (ref 38–234)

## 2018-08-27 LAB — HEPATITIS B CORE ANTIBODY, IGM: Hep B C IgM: NEGATIVE

## 2018-08-27 LAB — HEPATITIS B SURFACE ANTIGEN: Hepatitis B Surface Ag: NEGATIVE

## 2018-08-27 MED ORDER — JEVITY 1.2 CAL PO LIQD
1000.0000 mL | ORAL | Status: DC
  Filled 2018-08-27 (×2): qty 1000

## 2018-08-27 NOTE — Progress Notes (Signed)
Modified Barium Swallow Progress Note  Patient Details  Name: Lisa Mcdowell MRN: [id number] Date of Birth: 08/10/1950  Today's Date: 08/27/2018  Modified Barium Swallow completed.  Full report located under Chart Review in the Imaging Section.  Brief recommendations include the following:  Clinical Impression  Pt demonstrates a moderate dysphagia exacerbated by fear and anxiety related to sensation of residue and restricted swallow function. Pt needed maximal encouragement and effort to allow lingual transit to pharynx and initiate a swallow response. She tilted head back and needed several seconds to finally relase a small amount of bolus to give sensory feedback for swallow initiation and then subsequent multiple swallows. Pt swallowed two boluses throughout exam; one puree and one nectar. Initiation and adequate hyolaryngeal mobility were present, but suspect moderate inflamation of tissue impacting full mobility and increasing residue, particularly in the valleculae. Swallows of nectar thick liquid aided in clearing puree residue. UES opening appeared adequate. No significant esophageal component noted, though reduced negative pressure is a possiblity. Pt is recommended to rely on alternate method of nutrition over the short term during medical management of dermatomyositsis. She can attempt chewing and swallowing ice throughout the day. SLP will work on gradually advancing diet and reducing anxiety related to swallowing.    Swallow Evaluation Recommendations       SLP Diet Recommendations: Ice chips PRN after oral care                             Lisa Baltimore, MA [place] Pager [phone] Office [phone]    Lisa Mcdowell, [name] 08/27/2018,12:59 PM

## 2018-08-27 NOTE — Progress Notes (Signed)
Daily Rounding Note  08/27/2018, 3:35 PM  LOS: 2 days   SUBJECTIVE:   Chief complaint:  Dysphagia.   Dermatomyositis.  MBSS today shows moderate dysphagia.  Sensation of residue and restricted swallowing cause anxiety, fear.  SLP suspected moderate inflammation of tissue is limiting full hyolaryngeal mobility and increases residue especially at the vallecula.  Able to swallow nectar thick liquids and clear pure residue.  UES opening adequate.  No significant esophageal component noted.  SLP recommended alternate means of nutrition.  Daughter concerned by increased swelling and skin discoloration in the upper arms and across the sternum.  OBJECTIVE:         Vital signs in last 24 hours:    Temp:  [97.5 F (36.4 C)-98.1 F (36.7 C)] 97.5 F (36.4 C) (08/12 1142) Pulse Rate:  [72-93] 83 (08/12 1142) Resp:  [16-20] 20 (08/12 1142) BP: (116-144)/(62-80) 144/80 (08/12 1142) SpO2:  [92 %-99 %] 99 % (08/12 1142) Last BM Date: 08/24/18 Filed Weights   08/25/18 2132  Weight: 71.5 kg   General: Looks ill.  But in no distress.  Not toxic. Respiratory:   No labored breathing or cough. Derm: Dusky erythema and macular rash on the upper arms.  Macular rash across the sternum   Intake/Output from previous day: 08/11 0701 - 08/12 0700 In: 1893 [I.V.:1893] Out: -   Intake/Output this shift: No intake/output data recorded.  Lab Results: Recent Labs    08/25/18 1720 08/27/18 0339  WBC 8.4 8.7  HGB 14.9 12.8  HCT 44.9 38.7  PLT 224 199   BMET Recent Labs    08/25/18 1720 08/26/18 0402 08/27/18 0339  NA 138 139 138  K 4.1 4.1 3.8  CL 102 104 107  CO2 23 23 23   GLUCOSE 126* 139* 184*  BUN 21 18 19   CREATININE 0.57 0.49 0.44  CALCIUM 8.7* 8.1* 8.0*   LFT Recent Labs    08/25/18 1720 08/26/18 0402 08/27/18 0339  PROT 6.4* 5.4* 5.2*  ALBUMIN 3.0* 2.3* 2.3*  AST 626* 529* 465*  ALT 235* 191* 174*  ALKPHOS  42 39 39  BILITOT 1.0 0.9 0.8   PT/INR Recent Labs    08/26/18 0402  LABPROT 13.5  INR 1.0   Hepatitis Panel Recent Labs    08/26/18 0402  HEPBSAG Negative  HEPBIGM Negative    Studies/Results: Dg Chest 2 View  Result Date: 08/26/2018 CLINICAL DATA:  Progressive worsening dysphagia. EXAM: CHEST - 2 VIEW COMPARISON:  February 16, 2016 FINDINGS: Scarring is seen in the right apex. Tortuous thoracic aorta is stable. The heart, hila, and mediastinum are unchanged. Mild atelectasis in the bases. No other acute abnormalities. IMPRESSION: No active cardiopulmonary disease. Electronically Signed   By: Dorise Bullion III M.D   On: 08/26/2018 10:02   Dg Swallowing Func-speech Pathology  Result Date: 08/27/2018 Objective Swallowing Evaluation: Type of Study: MBS-Modified Barium Swallow Study  Patient Details Name: Lisa Mcdowell MRN: [id number] Date of Birth: [birth year] Today's Date: 08/27/2018 Time: SLP Start Time (ACUTE ONLY): 1100 -SLP Stop Time (ACUTE ONLY): 1117 SLP Time Calculation (min) (ACUTE ONLY): 17 min Past Medical History: Past Medical History: Diagnosis Date . Cholelithiasis  . Dermatomycosis  . Gallstones 08/08/2010 . GERD (gastroesophageal reflux disease)   pt denies having GERD on day of surgery . Hyperlipidemia  . Nausea & vomiting  Past Surgical History: Past Surgical History: Procedure Laterality Date . CHOLECYSTECTOMY  08/08/2010  Procedure: LAPAROSCOPIC CHOLECYSTECTOMY;  Surgeon: Scherry Ran;  Location: [facility];  Service: General;  Laterality: N/A; . COLONOSCOPY N/A 04/15/2015  Procedure: COLONOSCOPY;  Surgeon: Danie Binder, MD;  Location: [facility];  Service: Endoscopy;  Laterality: N/A;  1015 - moved to 10:00 - office to notify - interpreter scheduled, do NOT change time . INSERTION OF MESH N/A 01/23/2018  Procedure: INSERTION OF MESH;  Surgeon: Donnie Mesa, MD;  Location: [facility];  Service: General;  Laterality: N/A; . UMBILICAL HERNIA REPAIR N/A  01/23/2018  Procedure: UMBILICAL HERNIA REPAIR WITH MESH;  Surgeon: Donnie Mesa, MD;  Location: [facility];  Service: General;  Laterality: N/A; . VARICOSE VEIN SURGERY  2011  [facility] HPI: This is a 68 year old female with a PMH significant for dermatomysitis which appears to be a fairly new diagnosis for her. Patient was recently hospitalized for 7/26-7/31 for dermatomyositis and was subsequently treated with IV steroids.  She is discharged with p.o. prednisone.  Patient presented to the ED with a 4 day h/o progressively worsening dysphagia characterized by globus.  No data recorded Assessment / Plan / Recommendation CHL IP CLINICAL IMPRESSIONS 08/27/2018 Clinical Impression Pt demonstrates a moderate dysphagia exacerbated by fear and anxiety related to sensation of residue and restricted swallow function. Pt needed maximal encouragement and effort to allow lingual transit to pharynx and initiate a swallow response. She tilted head back and needed several seconds to finally relase a small amount of bolus to give sensory feedback for swallow initiation. Pt swallowed two boluses throughout exam; one puree and one nectar. Initiation and adequate hyolaryngeal mobility were present, but suspect moderate inflamation of tissue impacting full mobility and increasing residue, particularly in the vallecule. Swallows of nectar thick liquid aided in clearing puree residue. UES opening appeared adequate. No significant esopahgeal component noted, though reduced negative pressure is a possiblity. Pt is recommended to rely on alternate method of nutrition over the short term during medical management of dermatomyositsis. She can attempt chewing and swallowing ice throughout the day. SLP will work on gradually advancing diet and reducing anxiety related to swallowing.  SLP Visit Diagnosis Dysphagia, oropharyngeal phase (R13.12) Attention and concentration deficit following -- Frontal lobe and executive function  deficit following -- Impact on safety and function Risk for inadequate nutrition/hydration   CHL IP TREATMENT RECOMMENDATION 08/27/2018 Treatment Recommendations Therapy as outlined in treatment plan below   Prognosis 08/26/2018 Prognosis for Safe Diet Advancement Fair Barriers to Reach Goals -- Barriers/Prognosis Comment -- CHL IP DIET RECOMMENDATION 08/27/2018 SLP Diet Recommendations Ice chips PRN after oral care Liquid Administration via -- Medication Administration -- Compensations -- Postural Changes --   No flowsheet data found.  CHL IP FOLLOW UP RECOMMENDATIONS 08/27/2018 Follow up Recommendations 24 hour supervision/assistance   CHL IP FREQUENCY AND DURATION 08/27/2018 Speech Therapy Frequency (ACUTE ONLY) min 2x/week Treatment Duration 2 weeks      CHL IP ORAL PHASE 08/27/2018 Oral Phase Impaired Oral - Pudding Teaspoon -- Oral - Pudding Cup -- Oral - Honey Teaspoon -- Oral - Honey Cup -- Oral - Nectar Teaspoon Holding of bolus;Lingual pumping;Delayed oral transit Oral - Nectar Cup -- Oral - Nectar Straw -- Oral - Thin Teaspoon -- Oral - Thin Cup Holding of bolus;Lingual pumping;Delayed oral transit Oral - Thin Straw -- Oral - Puree Holding of bolus;Lingual pumping;Delayed oral transit Oral - Mech Soft -- Oral - Regular -- Oral - Multi-Consistency -- Oral - Pill -- Oral Phase - Comment --  CHL IP PHARYNGEAL  PHASE 08/27/2018 Pharyngeal Phase Impaired Pharyngeal- Pudding Teaspoon -- Pharyngeal -- Pharyngeal- Pudding Cup -- Pharyngeal -- Pharyngeal- Honey Teaspoon -- Pharyngeal -- Pharyngeal- Honey Cup -- Pharyngeal -- Pharyngeal- Nectar Teaspoon Reduced pharyngeal peristalsis;Pharyngeal residue - valleculae;Pharyngeal residue - pyriform Pharyngeal -- Pharyngeal- Nectar Cup -- Pharyngeal -- Pharyngeal- Nectar Straw -- Pharyngeal -- Pharyngeal- Thin Teaspoon -- Pharyngeal -- Pharyngeal- Thin Cup Reduced pharyngeal peristalsis;Pharyngeal residue - valleculae;Pharyngeal residue - pyriform Pharyngeal -- Pharyngeal- Thin  Straw -- Pharyngeal -- Pharyngeal- Puree Pharyngeal residue - valleculae;Reduced pharyngeal peristalsis Pharyngeal -- Pharyngeal- Mechanical Soft -- Pharyngeal -- Pharyngeal- Regular -- Pharyngeal -- Pharyngeal- Multi-consistency -- Pharyngeal -- Pharyngeal- Pill -- Pharyngeal -- Pharyngeal Comment --  CHL IP CERVICAL ESOPHAGEAL PHASE 08/27/2018 Cervical Esophageal Phase WFL Pudding Teaspoon -- Pudding Cup -- Honey Teaspoon -- Honey Cup -- Nectar Teaspoon -- Nectar Cup -- Nectar Straw -- Thin Teaspoon -- Thin Cup -- Thin Straw -- Puree -- Mechanical Soft -- Regular -- Multi-consistency -- Pill -- Cervical Esophageal Comment -- [name], MA CCC-SLP Acute Rehabilitation Services Pager [phone] Office [phone] Lynann Beaver 08/27/2018, 1:03 PM              US Abdomen Limited Ruq  Result Date: 08/26/2018 CLINICAL DATA:  Transaminitis EXAM: ULTRASOUND ABDOMEN LIMITED RIGHT UPPER QUADRANT COMPARISON:  12/16/2014 abdominal CT FINDINGS: Gallbladder: Cholecystectomy. Common bile duct: Diameter: 4 mm at the hilum. The distal common bile duct is obscured. Liver: Echogenic liver with diminished acoustic penetration. Portal vein is patent on color Doppler imaging with normal direction of blood flow towards the liver. IMPRESSION: 1. Hepatic steatosis. 2. Cholecystectomy with normal common bile duct diameter at the hilum. Electronically Signed   By: Monte Fantasia M.D.   On: 08/26/2018 06:55    ASSESMENT:   *    Dysphagia.  Pharyngeal, suspected esophageal dysphagia on BSS 8/11.   MBSS today confirms pharyngeal dysphagia.    *     Elevated LFTs.  Fatty liver, previous cholecystectomy per ultrasound. INR normal.  *     Dermatomyositis dx and treated w IV steroids 07/2018, home on prednisone.  Now IV Solu-Medrol resumed   PLAN   *   Would have RD place core track feeding tube.  Obtain and follow guidance from RD regarding tube feed formulation and rate, free water needs.    *     Case  discussed with Dr. Bryan Lemma.  No role for EGD.  There is no stricture or stenosis requiring dilatation.    Azucena Freed  08/27/2018, 3:35 PM Phone [phone]

## 2018-08-27 NOTE — Progress Notes (Cosign Needed)
Patient's daughter was at bedside and was concerned about new onset rash that appeared on the patient's left upper extremity.    On examination, majority of the medial aspect of the left upper extremity is erythematous with mild swelling however no lesions, abrasions, petechiae, purpura.  On further examination of the patient's skin, multiple large darker red rashes are present on the patient's right upper extremity, bilateral lower back, bilateral anterior thighs.  Patient's daughter states that the new rash on her left upper extremity appears the same as those older rashes did at the beginning.    Patient is currently receiving methylprednisone 60 mg which is a more potent dose in her outpatient 70 mg of prednisone.  Discussed that patient is receiving treatment for dermatomyositis, however current flare may be due to a gap in prednisone treatment during time when patient cannot swallow while at home.  Discussed that it may take longer for improvement in symptoms. Patient's daughter is interested in considering transferring to [place] to discuss it with the team and patient's family together tomorrow morning.   Both patient and daughter are under a lot of stress at this time. Recently, patient's husband passed away and current health problems have been worsening for 4 months now. Patient is starting to lose her hair in large quantities.

## 2018-08-27 NOTE — Progress Notes (Signed)
   Subjective:  Lisa Mcdowell had a modified barium swallow study with SLP this morning and reports she was unable to swallow anything, consistent with her experience over the past several days. She has no pain with swallowing and denies dyspnea and chest pain. She reports improvement in the itchiness of her rash with use of solumedrol. She is experiencing some anxiety and has been frequently tearful.    Objective:  Vital signs in last 24 hours: Vitals:   08/27/18 0005 08/27/18 0336 08/27/18 0809 08/27/18 1142  BP: 127/65 123/67 116/62 (!) 144/80  Pulse: 88 79 72 83  Resp: 16 17 16 20   Temp: 98.1 F (36.7 C) 98 F (36.7 C) 97.8 F (36.6 C) (!) 97.5 F (36.4 C)  TempSrc: Oral Oral Oral Oral  SpO2: 92% 97% 98% 99%  Weight:       Weight change:   Intake/Output Summary (Last 24 hours) at 08/27/2018 1342 Last data filed at 08/27/2018 0600 Gross per 24 hour  Intake 1893.04 ml  Output -  Net 1893.04 ml   Physical Exam: General: well-nourished, well-appearing, sitting comfortably at the edge of the bed.  HEENT: NCAT. Conjunctivae clear. No palpably lymph nodes or thyromegaly.  CV: RRR, no m/r/g appreciated. No peripheral edema.  Pulmonary: CTAB. normal WOB on RA. Abdominal: Soft, non-distended, non-tender to palpation.  MSK: Proximal muscle weakness. 3/5 left shoulder, 4/5 right shoulder.  Skin: Warm and dry. [name] present around eyes and the pre-auricular area as well as bilateral upper and lower extremities. [name] present on hands.  Neuro: Alert and oriented, conversant with normal speech. No focal deficits.   Lab results:  CK: 6,152 HBV:; Core Ab -ve, surface Ag -ve  Assessment/Plan:  Principal Problem:   Dysphagia Active Problems:   Secondary rhabdomyolysis   Adult type dermatomyositis (HCC)   Transaminitis   Elevated liver enzymes  Dysphagia:  - MBS w/ SLP indicates moderate dysphagia likely related to inflammation of tissue, exacerbated by  fear and anxiety related to sensation of residue and restricted swallow function.  - SLP diet recommendations: ice chips PRN after oral care.  - NG tube placement for nutritional supplementation  Dermatomyositis: - Continue treatment with IV solumedrol 60 mg qd - Age appropriate screening for malignancy  Transaminitis: - Continue steroid treatment, continue monitoring LFTs and CK.   Hyperglycemia: - SSI   DVT Prophylaxis: Lovenox 40 mg qd   Dispo: Anticipated discharge pending medical improvement or transfer to [facility]    LOS: 2 days   Kendell Bane, Medical Student 08/27/2018, 1:42 PM

## 2018-08-28 ENCOUNTER — Inpatient Hospital Stay: Payer: Self-pay

## 2018-08-28 DIAGNOSIS — R739 Hyperglycemia, unspecified: Secondary | ICD-10-CM

## 2018-08-28 DIAGNOSIS — R740 Nonspecific elevation of levels of transaminase and lactic acid dehydrogenase [LDH]: Secondary | ICD-10-CM

## 2018-08-28 LAB — RENAL FUNCTION PANEL
Albumin: 2.1 g/dL — ABNORMAL LOW (ref 3.5–5.0)
Anion gap: 8 (ref 5–15)
BUN: 14 mg/dL (ref 8–23)
CO2: 23 mmol/L (ref 22–32)
Calcium: 7.7 mg/dL — ABNORMAL LOW (ref 8.9–10.3)
Chloride: 108 mmol/L (ref 98–111)
Creatinine, Ser: 0.39 mg/dL — ABNORMAL LOW (ref 0.44–1.00)
GFR calc Af Amer: >60 mL/min (ref >60)
GFR calc non Af Amer: >60 mL/min (ref >60)
Glucose, Bld: 197 mg/dL — ABNORMAL HIGH (ref 70–99)
Phosphorus: 3.3 mg/dL (ref 2.5–4.6)
Potassium: 3.5 mmol/L (ref 3.5–5.1)
Sodium: 139 mmol/L (ref 135–145)

## 2018-08-28 LAB — CK: Total CK: 5749 U/L — ABNORMAL HIGH (ref 38–234)

## 2018-08-28 LAB — GLUCOSE, CAPILLARY
Glucose-Capillary: 183 mg/dL — ABNORMAL HIGH (ref 70–99)
Glucose-Capillary: 156 mg/dL — ABNORMAL HIGH (ref 70–99)
Glucose-Capillary: 113 mg/dL — ABNORMAL HIGH (ref 70–99)
Glucose-Capillary: 84 mg/dL (ref 70–99)
Glucose-Capillary: 68 mg/dL — ABNORMAL LOW (ref 70–99)
Glucose-Capillary: 102 mg/dL — ABNORMAL HIGH (ref 70–99)

## 2018-08-28 LAB — MAGNESIUM: Magnesium: 1.8 mg/dL (ref 1.7–2.4)

## 2018-08-28 MED ORDER — SODIUM CHLORIDE 0.9% FLUSH: 10.0000 mL | INTRAVENOUS | Status: DC | PRN

## 2018-08-28 MED ORDER — PRO-STAT SUGAR FREE PO LIQD
30.0000 mL | Freq: Two times a day (BID) | ORAL | Status: DC
  Administered 2018-08-29 – 2018-09-12 (×22): 30 mL
  Filled 2018-08-28 (×23): qty 30

## 2018-08-28 MED ORDER — JEVITY 1.2 CAL PO LIQD
1000.0000 mL | ORAL | Status: DC
  Administered 2018-08-29 – 2018-09-15 (×10): 1000 mL
  Filled 2018-08-28 (×23): qty 1000

## 2018-08-28 MED ORDER — SODIUM CHLORIDE 0.9% FLUSH
10.0000 mL | Freq: Two times a day (BID) | INTRAVENOUS | Status: DC
  Administered 2018-08-28: 20 mL
  Administered 2018-08-29: 10 mL
  Administered 2018-08-29: 20 mL
  Administered 2018-08-30 – 2018-09-06 (×14): 10 mL
  Administered 2018-09-06: 20 mL
  Administered 2018-09-07: 10 mL
  Administered 2018-09-07: 40 mL
  Administered 2018-09-08 – 2018-09-15 (×15): 10 mL
  Administered 2018-09-16: 23:00:00 20 mL
  Administered 2018-09-16 – 2018-09-17 (×3): 10 mL
  Administered 2018-09-18: 20 mL
  Administered 2018-09-18 – 2018-09-22 (×8): 10 mL

## 2018-08-28 NOTE — Progress Notes (Signed)
  Speech Language Pathology Treatment: Dysphagia  Patient Details Name: Lisa Mcdowell MRN: [id number] DOB: 17-Feb-1950 Today's Date: 08/28/2018 Time: 1314-3888 SLP Time Calculation (min) (ACUTE ONLY): 23 min  Assessment / Plan / Recommendation Clinical Impression  Patient sitting on side of bed and daughter in room. She was willing to have ice chips. Pt would let the ice chips melt in her mouth and then request to expectorate. Even with daughters encouragement, she would not initiate a swallow with ice chips. She did initiate a dry delayed swallow between ice chips, with a grimace and discomfort. Cortrak was not placed at this time and her daughter expressed concern for nutrition, as her mother is very weak. ST to continue to follow for assessment of swallow, reduced discomfort and patient's willingness to swallow PO.    HPI HPI: This is a 67 year old female with a PMH significant for dermatomysitis which appears to be a fairly new diagnosis for her. Patient was recently hospitalized for 7/26-7/31 for dermatomyositis and was subsequently treated with IV steroids.  She is discharged with p.o. prednisone.  Patient presented to the ED with a 4 day h/o progressively worsening dysphagia characterized by globus.       SLP Plan  Continue with current plan of care       Recommendations  Diet recommendations: NPO Medication Administration: Via alternative means                Oral Care Recommendations: Oral care QID;Oral care prior to ice chip/H20 Follow up Recommendations: 24 hour supervision/assistance SLP Visit Diagnosis: Dysphagia, oropharyngeal phase (R13.12) Plan: Continue with current plan of care       [place], MA, CCC-SLP 08/28/2018 11:13 AM

## 2018-08-28 NOTE — Progress Notes (Signed)
Notified clinician of cortrak availability 08/29/18, approval to wait on gastric tube until then.   Arlis Porta

## 2018-08-28 NOTE — Progress Notes (Signed)
Peripherally Inserted Central Catheter/Midline Placement  The IV Nurse has discussed with the patient and/or persons authorized to consent for the patient, the purpose of this procedure and the potential benefits and risks involved with this procedure.  The benefits include less needle sticks, lab draws from the catheter, and the patient may be discharged home with the catheter. Risks include, but not limited to, infection, bleeding, blood clot (thrombus formation), and puncture of an artery; nerve damage and irregular heartbeat and possibility to perform a PICC exchange if needed/ordered by physician.  Alternatives to this procedure were also discussed.  Bard Power PICC patient education guide, fact sheet on infection prevention and patient information card has been provided to patient /or left at bedside.  Interpreter [phone] via [employer].  PICC/Midline Placement Documentation  PICC Single Lumen 38/18/40 PICC Right Basilic 38 cm 0 cm (Active)       Lisa Mcdowell, Nicolette Bang 08/28/2018, 9:17 PM

## 2018-08-28 NOTE — Progress Notes (Signed)
Initial Nutrition Assessment  DOCUMENTATION CODES:   Not applicable  INTERVENTION:  Plans for Cortrak NGT placement tomorrow.   Once feeding tube ready for use,  Initiate Jevity 1.2 @ 25 ml/hr and increase by 10 ml every 4 hours to goal rate of 55 ml/hr.   30 ml Prostat BID.    Tube feeding regimen provides 1784 kcal (100% of needs), 103 grams of protein, and 1069 ml of H2O.   NUTRITION DIAGNOSIS:   Inadequate oral intake related to dysphagia as evidenced by NPO status.  GOAL:   Patient will meet greater than or equal to 90% of their needs  MONITOR:   TF tolerance, Diet advancement, Weight trends, Labs, I & O's, Skin  REASON FOR ASSESSMENT:   Consult Enteral/tube feeding initiation and management  ASSESSMENT:   68 year old female with recently diagnosed dermatomyositis, now with dysphagia to solids and liquids.  Daughter at pt bedside. Pt reports dysphagia started Friday, 8/7, however was still able to take some PO. On Sunday, 8/9 pt reports unable to swallow completely. Pt reports eating well with no difficulties prior to dysphagia onset. Pt with no nutrition over the past 4 days. Pt underwent MBS yesterday. Pt with moderate dysphagia exacerbated by fear and anxiety related to sensation of residue and restricted swallow function per SLP. Per MD, pt with inflammation of tissue. Pt NPO. Plans for enteral nutrition support. Cortrak NGT to be placed tomorrow.   Labs and medications reviewed.   NUTRITION - FOCUSED PHYSICAL EXAM:    Most Recent Value  Orbital Region  No depletion  Upper Arm Region  No depletion  Thoracic and Lumbar Region  No depletion  Buccal Region  No depletion  Temple Region  No depletion  Clavicle Bone Region  No depletion  Clavicle and Acromion Bone Region  No depletion  Scapular Bone Region  Unable to assess  Dorsal Hand  No depletion  Patellar Region  No depletion  Anterior Thigh Region  No depletion  Posterior Calf Region  No depletion   Edema (RD Assessment)  Mild  Hair  Reviewed  Eyes  Reviewed  Mouth  Reviewed  Skin  Reviewed  Nails  Reviewed       Diet Order:   Diet Order            Diet NPO time specified Except for: Ice Chips  Diet effective now              EDUCATION NEEDS:   Not appropriate for education at this time  Skin:  Skin Assessment: Reviewed RN Assessment  Last BM:  8/9  Height:   Ht Readings from Last 1 Encounters:  08/01/18 5\' 6"  (1.676 m)    Weight:   Wt Readings from Last 1 Encounters:  08/28/18 71.5 kg    Ideal Body Weight:  59 kg  BMI:  Body mass index is 25.44 kg/m.  Estimated Nutritional Needs:   Kcal:  1610-9604  Protein:  85-100 grams  Fluid:  1.7 - 1.9 L/day    Corrin Parker, MS, RD, LDN Pager # [phone] After hours/ weekend pager # [phone]

## 2018-08-28 NOTE — Progress Notes (Signed)
Noted increased swelling to LUA and redness to both U extremities with increased weakness to LUE and LLE  Pt now needs 2 person assist to get  oob to bathroom or B [place] ,RN talked to pt's daughter [name] who verbalized increase concern about mother;s worsening condition and wants to transfer her to [facility].  Education given as well as emotional support to both pt and daughter. Worried mother has not eaten since last Friday and The cor tract team were not around yesterday to place enteral tube for Tube feeding until Friday.

## 2018-08-28 NOTE — Progress Notes (Signed)
Pt output is low this am  Pt had only 125 ml  In 12 hrs  Bladder scan done post void and only 53 ml noted . MD on call made aware this am. No new orders given.

## 2018-08-28 NOTE — Plan of Care (Signed)
  Problem: Health Behavior/Discharge Planning: Goal: Ability to manage health-related needs will improve Outcome: Not Progressing   Problem: Health Behavior/Discharge Planning: Goal: Ability to manage health-related needs will improve Outcome: Not Progressing

## 2018-08-28 NOTE — Progress Notes (Signed)
Assessed both arms for a PIV, there veins in both AC are hard and noncompressable . Both arms have some swelling and bruising . No PIV placed .Primary RN aware and will notify MD.

## 2018-08-28 NOTE — Progress Notes (Signed)
   Subjective:  Lisa Mcdowell developed a new area of rash on her LUE yesterday, similar in appearance to the rest of her dermatomyositis rash. Her main complaints are her inability to swallow and increased weakness. She remains anxious. She and her family are considering whether they want to transfer care to [facility].    Objective:  Vital signs in last 24 hours: Vitals:   08/28/18 0148 08/28/18 0328 08/28/18 0528 08/28/18 0749  BP:  123/72  116/69  Pulse:  77  71  Resp:  17  15  Temp:  97.6 F (36.4 C)  98.7 F (37.1 C)  TempSrc:  Oral  Oral  SpO2:  95%  97%  Weight: 71.4 kg  71.5 kg    Weight change:   Intake/Output Summary (Last 24 hours) at 08/28/2018 1130 Last data filed at 08/28/2018 0844 Gross per 24 hour  Intake 829.98 ml  Output 375 ml  Net 454.98 ml   Physical Exam: General: well-nourished, well-appearing, laying comfortably in bed HEENT: NCAT. Conjunctivae clear. No palpable lymph nodes or thyromegaly.  CV: RRR, no m/r/g appreciated. No peripheral edema.  Pulmonary: CTAB. normal WOB on RA. Abdominal: Soft, non-distended.  MSK: Proximal muscle weakness. 3/5 left shoulder, 4/5 right shoulder.  Skin: Warm and dry. New rash and swelling present on LUE. [name] present around eyes and the pre-auricular area as well as bilateral upper and lower extremities. [name] present on hands.  Neuro: Alert and oriented, conversant with normal speech. No focal deficits.   Lab results:  CK: to 5,749 from 6,152 yesterday.    Assessment/Plan:  Principal Problem:   Dysphagia Active Problems:   Secondary rhabdomyolysis   Adult type dermatomyositis (HCC)   Transaminitis   Elevated liver enzymes  Dysphagia:  - MBS w/ SLP indicates moderate dysphagia likely related to inflammation of tissue, exacerbated by fear and anxiety related to globus sensation and restricted swallow function.  - SLP diet recommendations: ice chips PRN after oral care.  - NG tube placement  for nutritional supplementation - Continue working with SLP  Dermatomyositis: - Continue treatment with IV solumedrol 60 mg qd - CK downtrending. 5,749 from 6,152 yesterday. Pt's increased perception of weakness likely due to poor nutrition for the past week rather than progressing muscle damage.  Transaminitis: - Continue steroid treatment, continue monitoring LFTs and CK.   Hyperglycemia: - SSI   F: dextrose 5% in 0.9% NaCl infusion 75 ml/hr E: replete as necessary. Within normal limits today.  N: Place NG tube for nutritional support   DVT Prophylaxis: Lovenox 40 mg qd   Dispo: Anticipated discharge pending medical improvement or transfer to [facility]    LOS: 3 days   Lisa Mcdowell, Medical Student 08/28/2018, 11:30 AM

## 2018-08-29 ENCOUNTER — Inpatient Hospital Stay (HOSPITAL_COMMUNITY): Payer: Medicaid Other

## 2018-08-29 LAB — COMPREHENSIVE METABOLIC PANEL
ALT: 149 U/L — ABNORMAL HIGH (ref 0–44)
AST: 410 U/L — ABNORMAL HIGH (ref 15–41)
Albumin: 2 g/dL — ABNORMAL LOW (ref 3.5–5.0)
Alkaline Phosphatase: 37 U/L — ABNORMAL LOW (ref 38–126)
Anion gap: 7 (ref 5–15)
BUN: 13 mg/dL (ref 8–23)
CO2: 23 mmol/L (ref 22–32)
Calcium: 7.5 mg/dL — ABNORMAL LOW (ref 8.9–10.3)
Chloride: 110 mmol/L (ref 98–111)
Creatinine, Ser: 0.4 mg/dL — ABNORMAL LOW (ref 0.44–1.00)
GFR calc Af Amer: >60 mL/min (ref >60)
GFR calc non Af Amer: >60 mL/min (ref >60)
Glucose, Bld: 214 mg/dL — ABNORMAL HIGH (ref 70–99)
Potassium: 3.3 mmol/L — ABNORMAL LOW (ref 3.5–5.1)
Sodium: 140 mmol/L (ref 135–145)
Total Bilirubin: 0.7 mg/dL (ref 0.3–1.2)
Total Protein: 4.7 g/dL — ABNORMAL LOW (ref 6.5–8.1)

## 2018-08-29 LAB — CK: Total CK: 5144 U/L — ABNORMAL HIGH (ref 38–234)

## 2018-08-29 LAB — GLUCOSE, CAPILLARY
Glucose-Capillary: 104 mg/dL — ABNORMAL HIGH (ref 70–99)
Glucose-Capillary: 141 mg/dL — ABNORMAL HIGH (ref 70–99)
Glucose-Capillary: 150 mg/dL — ABNORMAL HIGH (ref 70–99)
Glucose-Capillary: 166 mg/dL — ABNORMAL HIGH (ref 70–99)
Glucose-Capillary: 201 mg/dL — ABNORMAL HIGH (ref 70–99)
Glucose-Capillary: 87 mg/dL (ref 70–99)

## 2018-08-29 MED ORDER — IOHEXOL 300 MG/ML  SOLN
25.0000 mL | Freq: Once | INTRAMUSCULAR | Status: AC | PRN
  Administered 2018-08-29: 10 mL

## 2018-08-29 MED ORDER — LIDOCAINE VISCOUS HCL 2 % MT SOLN
15.0000 mL | Freq: Once | OROMUCOSAL | Status: AC
  Administered 2018-08-29: 15 mL via OROMUCOSAL
  Filled 2018-08-29: qty 15

## 2018-08-29 MED ORDER — FREE WATER
400.0000 mL | Freq: Three times a day (TID) | Status: DC
  Administered 2018-08-29 – 2018-09-16 (×45): 400 mL

## 2018-08-29 MED ORDER — METHYLPREDNISOLONE SODIUM SUCC 40 MG IJ SOLR
40.0000 mg | Freq: Two times a day (BID) | INTRAMUSCULAR | Status: DC
  Administered 2018-08-29 – 2018-09-15 (×32): 40 mg via INTRAVENOUS
  Filled 2018-08-29 (×32): qty 1

## 2018-08-29 MED ORDER — LORAZEPAM 2 MG/ML IJ SOLN
1.0000 mg | Freq: Once | INTRAMUSCULAR | Status: AC
  Administered 2018-08-29: 1 mg via INTRAVENOUS
  Filled 2018-08-29: qty 1

## 2018-08-29 MED ORDER — SODIUM CHLORIDE 0.9 % IV SOLN
INTRAVENOUS | Status: DC
  Administered 2018-08-29: 21:00:00 via INTRAVENOUS

## 2018-08-29 NOTE — Progress Notes (Signed)
  Speech Language Pathology Treatment: Dysphagia  Patient Details Name: Lisa Mcdowell MRN: [id number] DOB: [birth year] Today's Date: 08/29/2018 Time: 8032-1224 SLP Time Calculation (min) (ACUTE ONLY): 15 min  Assessment / Plan / Recommendation Clinical Impression  F/u for dysphagia.  Unfortunately, cortrak unable to be placed at bedside despite repeated attempts, likely due to tight UES noted on MBS.  Son was present during session. Pt attempted to swallow several ice chips to no avail, eventually expectorating.  No clinical improvements.  Review of MBS reveals impairments in mechanics of swallow that are not likely related to anxiety.  Hopefully small bore NG can be placed under fluoro today with better success. SLP will follow along.    HPI HPI: This is a 68 year old female with a PMH significant for dermatomysitis which appears to be a fairly new diagnosis for her. Patient was recently hospitalized for 7/26-7/31 for dermatomyositis and was subsequently treated with IV steroids.  She is discharged with p.o. prednisone.  Patient presented to the ED with a 4 day h/o progressively worsening dysphagia characterized by globus.       SLP Plan  Continue with current plan of care       Recommendations  Diet recommendations: Other(comment)(Pos as tolerated) Medication Administration: Via alternative means                Oral Care Recommendations: Oral care QID SLP Visit Diagnosis: Dysphagia, oropharyngeal phase (R13.12) Plan: Continue with current plan of care       GO                Lisa Mcdowell 08/29/2018, 12:01 PM  Lisa Mcdowell, [place] Office number [phone] Pager [phone]

## 2018-08-29 NOTE — Progress Notes (Addendum)
Cortrak Tube Team Note:  Consult received to place a Cortrak feeding tube.   Multiple attempts made to pass tube through GE junction without success. MD notified. Plan for diagnostic radiology placement and further SLP follow up.   Addendum: Cortrak placed in L nare at 72 cm marking by diagnostic radiology. RD secured tube with bridle.   Mariana Single RD, LDN Clinical Nutrition Pager # [phone]

## 2018-08-29 NOTE — H&P (View-Only) (Signed)
[facility] Progress Note  CC:  Dysphagia  Subjective:  Called back to see patient regarding dysphagia.  Dysphagia was found to be pharyngeal secondary to DM.  Core track team had difficulty placing feeding tube at bedside so ultimately it was placed under fluoroscopy.  Apparently there was dysfunction or spasm at the UES, which is unusual with DM.  GI called back to help determine if there may be another source of her swallowing dysfunction.  Objective:  Vital signs in last 24 hours: Temp:  [97.8 F (36.6 C)-98.2 F (36.8 C)] 98 F (36.7 C) (08/14 1114) Pulse Rate:  [70-86] 74 (08/14 1114) Resp:  [14-20] 14 (08/14 1114) BP: (114-134)/(64-76) 129/64 (08/14 1114) SpO2:  [95 %-99 %] 98 % (08/14 1114) Weight:  [71.3 kg] 71.3 kg (08/14 0500) Last BM Date: 08/24/18 General:  Alert, Well-developed, in NAD Heart:  Regular rate and rhythm; no murmurs Pulm:  CTAB.  No increased WOB. Abdomen:  Soft, non-distended.  BS present.  Non-tender. Neurologic:  Alert and oriented x 4;  grossly normal neurologically. Psych:  Alert and cooperative. Normal mood and affect.  Intake/Output from previous day: 08/13 0701 - 08/14 0700 In: 620 [I.V.:620] Out: 525 [Urine:525]  Lab Results: Recent Labs    08/27/18 0339  WBC 8.7  HGB 12.8  HCT 38.7  PLT 199   BMET Recent Labs    08/27/18 0339 08/28/18 0422 08/29/18 0509  NA 138 139 140  K 3.8 3.5 3.3*  CL 107 108 110  CO2 23 23 23   GLUCOSE 184* 197* 214*  BUN 19 14 13   CREATININE 0.44 0.39* 0.40*  CALCIUM 8.0* 7.7* 7.5*   LFT Recent Labs    08/29/18 0509  PROT 4.7*  ALBUMIN 2.0*  AST 410*  ALT 149*  ALKPHOS 37*  BILITOT 0.7   Dg Abd 1 View  Result Date: 08/29/2018 CLINICAL DATA:  Feeding catheter placement EXAM: ABDOMEN - 1 VIEW COMPARISON:  None. FINDINGS: Feeding catheter is noted in the second portion the duodenum. This was confirmed utilizing a contrast injection. IMPRESSION: Feeding catheter in the second  portion of the duodenum. Electronically Signed   By: Inez Catalina M.D.   On: 08/29/2018 14:22   [place] Ekg Site Rite  Result Date: 08/28/2018 If Site Rite image not attached, placement could not be confirmed due to current cardiac rhythm.  [place] Ekg Site Rite  Result Date: 08/28/2018 If Site Rite image not attached, placement could not be confirmed due to current cardiac rhythm.  Assessment / Plan: *    Dysphagia.  MBSS confirmed pharyngeal dysphagia.  Thought all due to dermatomyositis.  GI signed off as no role for EGD.  Now with issues placing feeding tube with UES dysfunction/spasm, which apparently is unusual with DM.   *     Dermatomyositis dx and treated w IV steroids 07/2018, home on prednisone.  Now IV Solu-Medrol resumed.  **Will plan to proceed with EGD on 8/15 to ensure no other structural esophageal issues.  Discussed with daughter by phone.  She is in agreement to have her mother undergo EGD.  She will explain to her mother.   LOS: 4 days   Laban Emperor. Zehr  08/29/2018, 4:17 PM   Attending physician's note   I have taken an interval history, reviewed the chart and examined the patient. I agree with the Advanced Practitioner's note, impression and recommendations.   Plan for EGD tomorrow a.m. to exclude esophageal stricture or mass lesion N.p.o. after  midnight The risks and benefits as well as alternatives of endoscopic procedure(s) have been discussed and reviewed. All questions answered. The patient agrees to proceed.   Damaris Hippo , MD [phone]

## 2018-08-29 NOTE — Progress Notes (Addendum)
[facility] Progress Note  CC:  Dysphagia  Subjective:  Called back to see patient regarding dysphagia.  Dysphagia was found to be pharyngeal secondary to DM.  Core track team had difficulty placing feeding tube at bedside so ultimately it was placed under fluoroscopy.  Apparently there was dysfunction or spasm at the UES, which is unusual with DM.  GI called back to help determine if there may be another source of her swallowing dysfunction.  Objective:  Vital signs in last 24 hours: Temp:  [97.8 F (36.6 C)-98.2 F (36.8 C)] 98 F (36.7 C) (08/14 1114) Pulse Rate:  [70-86] 74 (08/14 1114) Resp:  [14-20] 14 (08/14 1114) BP: (114-134)/(64-76) 129/64 (08/14 1114) SpO2:  [95 %-99 %] 98 % (08/14 1114) Weight:  [71.3 kg] 71.3 kg (08/14 0500) Last BM Date: 08/24/18 General:  Alert, Well-developed, in NAD Heart:  Regular rate and rhythm; no murmurs Pulm:  CTAB.  No increased WOB. Abdomen:  Soft, non-distended.  BS present.  Non-tender. Neurologic:  Alert and oriented x 4;  grossly normal neurologically. Psych:  Alert and cooperative. Normal mood and affect.  Intake/Output from previous day: 08/13 0701 - 08/14 0700 In: 620 [I.V.:620] Out: 525 [Urine:525]  Lab Results: Recent Labs    08/27/18 0339  WBC 8.7  HGB 12.8  HCT 38.7  PLT 199   BMET Recent Labs    08/27/18 0339 08/28/18 0422 08/29/18 0509  NA 138 139 140  K 3.8 3.5 3.3*  CL 107 108 110  CO2 23 23 23   GLUCOSE 184* 197* 214*  BUN 19 14 13   CREATININE 0.44 0.39* 0.40*  CALCIUM 8.0* 7.7* 7.5*   LFT Recent Labs    08/29/18 0509  PROT 4.7*  ALBUMIN 2.0*  AST 410*  ALT 149*  ALKPHOS 37*  BILITOT 0.7   Dg Abd 1 View  Result Date: 08/29/2018 CLINICAL DATA:  Feeding catheter placement EXAM: ABDOMEN - 1 VIEW COMPARISON:  None. FINDINGS: Feeding catheter is noted in the second portion the duodenum. This was confirmed utilizing a contrast injection. IMPRESSION: Feeding catheter in the second  portion of the duodenum. Electronically Signed   By: Inez Catalina M.D.   On: 08/29/2018 14:22   [place] Ekg Site Rite  Result Date: 08/28/2018 If Site Rite image not attached, placement could not be confirmed due to current cardiac rhythm.  [place] Ekg Site Rite  Result Date: 08/28/2018 If Site Rite image not attached, placement could not be confirmed due to current cardiac rhythm.  Assessment / Plan: *    Dysphagia.  MBSS confirmed pharyngeal dysphagia.  Thought all due to dermatomyositis.  GI signed off as no role for EGD.  Now with issues placing feeding tube with UES dysfunction/spasm, which apparently is unusual with DM.   *     Dermatomyositis dx and treated w IV steroids 07/2018, home on prednisone.  Now IV Solu-Medrol resumed.  **Will plan to proceed with EGD on 8/15 to ensure no other structural esophageal issues.  Discussed with daughter by phone.  She is in agreement to have her mother undergo EGD.  She will explain to her mother.   LOS: 4 days   Laban Emperor. Zehr  08/29/2018, 4:17 PM   Attending physician's note   I have taken an interval history, reviewed the chart and examined the patient. I agree with the Advanced Practitioner's note, impression and recommendations.   Plan for EGD tomorrow a.m. to exclude esophageal stricture or mass lesion N.p.o. after  midnight The risks and benefits as well as alternatives of endoscopic procedure(s) have been discussed and reviewed. All questions answered. The patient agrees to proceed.   Damaris Hippo , MD [phone]

## 2018-08-29 NOTE — Progress Notes (Signed)
Subjective:   Patient says she does not feel better today at all.  She continues to have pain in her arm left upper extremity and right lower extremity.  Continues to not be able to swallow.  Son at bedside and expressed concern about feeding tube not being placed yet.  We discussed that we are working on getting it done today.  We also discussed that her lab values are improving even though she is not feeling better symptomatically.  We will work on symptomatic improvement.  Objective:  Vital signs in last 24 hours: Vitals:   08/29/18 0422 08/29/18 0500 08/29/18 0823 08/29/18 1114  BP: 122/73  114/76 129/64  Pulse: 73  70 74  Resp: 18  20 14   Temp: 98.2 F (36.8 C)  98 F (36.7 C) 98 F (36.7 C)  TempSrc:   Oral Oral  SpO2: 97%  99% 98%  Weight:  71.3 kg     Physical Exam Constitutional:      Appearance: She is obese.  Cardiovascular:     Rate and Rhythm: Normal rate and regular rhythm.     Heart sounds: No murmur.  Pulmonary:     Effort: Pulmonary effort is normal. No respiratory distress.     Breath sounds: Normal breath sounds. No wheezing or rales.  Skin:    General: Skin is warm and dry.     Comments: Large, erythematous region extending most of the left bicep and with swelling  Neurological:     General: No focal deficit present.     Mental Status: She is alert. Mental status is at baseline.  Psychiatric:        Mood and Affect: Mood normal.        Behavior: Behavior normal.    Assessment/Plan:  Principal Problem:   Dysphagia Active Problems:   Secondary rhabdomyolysis   Adult type dermatomyositis (HCC)   Transaminitis   Elevated liver enzymes  #Dysphagia #Dermatomyositis In light of her recent diagnosis of dermatomyositis with significant muscle involvement, possible that dysphagia is connected.  However her CK has continued to improve.  Initially, SLP stated that anxiety was a large factor in her inability to swallow based on their reading of MBS.   However today I spoke with Estill Bamberg from SLP, who reviewed the MBS again and feels that dermatomyositis is contributing due to lack of adequate movement of pharyngeal? Muscles.    In order to begin replenishing nutritional needs, NG tube will be placed.  On second attempt to insert NG tube, was successful.  We will begin tube feeds today.  Spoke with Dr. Sharlette Dense with rheumatology at [facility] today.  She stated it is reasonable to attempt to transfer the patient to their hospital given that we cannot do an EMG or muscle biopsy at this hospital.  Before any more invasive treatments are initiated, it would be important to confirm dermatomyositis diagnoses.  I will begin working on transfer paperwork today.  Otherwise in the meantime, Dr. Sharlette Dense has suggested Solu-Medrol 40 mg twice daily.  Update the family.  Contact information: physician line for [facility]: [phone] - Dr. Sharlette Dense, Rheum fellow  -Initiate tube feeds via NG tube -Increase Solu-Medrol to 40 mg twice daily   #Secondary Rhadomyolysis #Elevated LFTs Secondary to dermatomyositis.  Elevated CK may be contributing significantly to elevated AST. All lab values have been consistently improving since admission. Continue to monitor closely.   - Trend LFTs - Trend CK - IV Fluids   Dispo: Anticipated  discharge pending medical improvement  Dr. Jose Persia Internal Medicine PGY-1  Pager: [phone] 08/29/2018, 12:55 PM

## 2018-08-30 ENCOUNTER — Encounter (HOSPITAL_COMMUNITY): Payer: Self-pay | Admitting: Gastroenterology

## 2018-08-30 ENCOUNTER — Inpatient Hospital Stay (HOSPITAL_COMMUNITY): Payer: Medicaid Other | Admitting: Certified Registered Nurse Anesthetist

## 2018-08-30 ENCOUNTER — Encounter (HOSPITAL_COMMUNITY)
Admission: EM | Disposition: A | Discharge status: Rehabilitation Facility | Payer: Self-pay | Source: Home / Self Care | Attending: Internal Medicine

## 2018-08-30 ENCOUNTER — Inpatient Hospital Stay (HOSPITAL_COMMUNITY): Payer: Medicaid Other

## 2018-08-30 HISTORY — PX: BIOPSY: SHX5522

## 2018-08-30 HISTORY — PX: ESOPHAGOGASTRODUODENOSCOPY (EGD) WITH PROPOFOL: SHX5813

## 2018-08-30 LAB — COMPREHENSIVE METABOLIC PANEL
ALT: 138 U/L — ABNORMAL HIGH (ref 0–44)
AST: 334 U/L — ABNORMAL HIGH (ref 15–41)
Albumin: 2 g/dL — ABNORMAL LOW (ref 3.5–5.0)
Alkaline Phosphatase: 38 U/L (ref 38–126)
Anion gap: 6 (ref 5–15)
BUN: 14 mg/dL (ref 8–23)
CO2: 24 mmol/L (ref 22–32)
Calcium: 7.5 mg/dL — ABNORMAL LOW (ref 8.9–10.3)
Chloride: 110 mmol/L (ref 98–111)
Creatinine, Ser: 0.39 mg/dL — ABNORMAL LOW (ref 0.44–1.00)
GFR calc Af Amer: >60 mL/min (ref >60)
GFR calc non Af Amer: >60 mL/min (ref >60)
Glucose, Bld: 179 mg/dL — ABNORMAL HIGH (ref 70–99)
Potassium: 3.1 mmol/L — ABNORMAL LOW (ref 3.5–5.1)
Sodium: 140 mmol/L (ref 135–145)
Total Bilirubin: 0.4 mg/dL (ref 0.3–1.2)
Total Protein: 4.6 g/dL — ABNORMAL LOW (ref 6.5–8.1)

## 2018-08-30 LAB — GLUCOSE, CAPILLARY
Glucose-Capillary: 116 mg/dL — ABNORMAL HIGH (ref 70–99)
Glucose-Capillary: 117 mg/dL — ABNORMAL HIGH (ref 70–99)
Glucose-Capillary: 118 mg/dL — ABNORMAL HIGH (ref 70–99)
Glucose-Capillary: 140 mg/dL — ABNORMAL HIGH (ref 70–99)
Glucose-Capillary: 168 mg/dL — ABNORMAL HIGH (ref 70–99)
Glucose-Capillary: 178 mg/dL — ABNORMAL HIGH (ref 70–99)
Glucose-Capillary: 74 mg/dL (ref 70–99)

## 2018-08-30 LAB — CK: Total CK: 4604 U/L — ABNORMAL HIGH (ref 38–234)

## 2018-08-30 SURGERY — ESOPHAGOGASTRODUODENOSCOPY (EGD) WITH PROPOFOL
Anesthesia: Monitor Anesthesia Care

## 2018-08-30 MED ORDER — PROPOFOL 10 MG/ML IV BOLUS
INTRAVENOUS | Status: DC | PRN
  Administered 2018-08-30 (×3): 20 mg via INTRAVENOUS

## 2018-08-30 MED ORDER — SODIUM CHLORIDE 0.9 % IV SOLN: INTRAVENOUS | Status: DC

## 2018-08-30 MED ORDER — PANTOPRAZOLE SODIUM 40 MG IV SOLR
40.0000 mg | Freq: Two times a day (BID) | INTRAVENOUS | Status: DC
  Administered 2018-08-30 – 2018-09-12 (×27): 40 mg via INTRAVENOUS
  Filled 2018-08-30 (×27): qty 40

## 2018-08-30 MED ORDER — PROPOFOL 500 MG/50ML IV EMUL
INTRAVENOUS | Status: DC | PRN
  Administered 2018-08-30: 100 ug/kg/min via INTRAVENOUS

## 2018-08-30 MED ORDER — DEXTROSE 5 % IV SOLN
INTRAVENOUS | Status: DC
  Administered 2018-08-30 – 2018-09-04 (×6): via INTRAVENOUS

## 2018-08-30 MED ORDER — LACTATED RINGERS IV SOLN
INTRAVENOUS | Status: DC
  Administered 2018-08-30: 12:00:00 via INTRAVENOUS

## 2018-08-30 SURGICAL SUPPLY — 15 items

## 2018-08-30 NOTE — Progress Notes (Signed)
[facility] for possible transfer today. Unfortunately they are at capacity and unable to facilitate a transfer.   [name], MD  [facility] PGY3  Pager: [phone]

## 2018-08-30 NOTE — Interval H&P Note (Signed)
History and Physical Interval Note:  08/30/2018 11:45 AM  Lisa Mcdowell  has presented today for surgery, with the diagnosis of Dysphagia.  The various methods of treatment have been discussed with the patient and family. After consideration of risks, benefits and other options for treatment, the patient has consented to  Procedure(s): ESOPHAGOGASTRODUODENOSCOPY (EGD) WITH PROPOFOL (N/A) as a surgical intervention.  The patient's history has been reviewed, patient examined, no change in status, stable for surgery.  I have reviewed the patient's chart and labs.  Questions were answered to the patient's satisfaction.     Kavitha Nandigam

## 2018-08-30 NOTE — Progress Notes (Signed)
NG Tube placed into L nare Maurine Minister and [employer] present, air auscultated by both nurses, Xray ordered

## 2018-08-30 NOTE — Op Note (Addendum)
[facility] Patient Name: Lisa Mcdowell Procedure Date : 08/30/2018 MRN: [id number] Attending MD: Mauri Pole , MD Date of Birth: 04/18/1950 CSN: [id number] Age: 69 Admit Type: Inpatient Procedure:                Upper GI endoscopy Indications:              Dysphagia Providers:                Mauri Pole, MD, Vista Lawman, RN, Laverda Sorenson, Technician, Clearnce Sorrel, CRNA Referring MD:              Medicines:                Monitored Anesthesia Care Complications:            No immediate complications. Estimated Blood Loss:     Estimated blood loss was minimal. Procedure:                Pre-Anesthesia Assessment:                           - Prior to the procedure, a History and Physical                            was performed, and patient medications and                            allergies were reviewed. The patient's tolerance of                            previous anesthesia was also reviewed. The risks                            and benefits of the procedure and the sedation                            options and risks were discussed with the patient.                            All questions were answered, and informed consent                            was obtained. Prior Anticoagulants: The patient has                            taken no previous anticoagulant or antiplatelet                            agents. ASA Grade Assessment: III - A patient with                            severe systemic disease. After reviewing the risks  and benefits, the patient was deemed in                            satisfactory condition to undergo the procedure.                           After obtaining informed consent, the endoscope was                            passed under direct vision. Throughout the                            procedure, the patient's blood pressure, pulse, and   oxygen saturations were monitored continuously. The                            GIF-H190 ([id number]) Olympus gastroscope was                            introduced through the mouth, and advanced to the                            second part of duodenum. The upper GI endoscopy was                            accomplished without difficulty. The patient                            tolerated the procedure well. Scope In: Scope Out: Findings:      No endoscopic abnormality was evident in the esophagus to explain the       patient's complaint of dysphagia. Superfial mucosal erosions, likely       from trauma during placement of feeding tube. EG junction, wide open       with no tight stricture or stenosis.      Patchy moderate inflammation with hemorrhage characterized by congestion       (edema), erosions, erythema and friability was found in the cardia, in       the gastric body, in the gastric antrum and in the prepyloric region of       the stomach. Biopsies were taken with a cold forceps for histology.       Biopsies were taken with a cold forceps for Helicobacter pylori testing.      The examined duodenum was normal.      Feeding tube dislodged during the procedure, tried to replace it but was       getting entagled and pulled out with the scope. Impression:               - No endoscopic esophageal abnormality to explain                            patient's dysphagia.                           - Erosive gastritis with hemorrhage. Biopsied.                           -  Normal examined duodenum. Recommendation:           - Patient has a contact number available for                            emergencies. The signs and symptoms of potential                            delayed complications were discussed with the                            patient. Return to normal activities tomorrow.                            Written discharge instructions were provided to the                             patient.                           - IV protonix 40mg  twice daily                           - Continue present medications.                           - Await pathology results.                           - Will need replacement of Dobhoff feeding tube or                            consider gastro jejnuostomy for enteric feeding.                           - Advance diet as tolerated                           - GI will sign off, please call with any questions Procedure Code(s):        --- Professional ---                           [phone], Esophagogastroduodenoscopy, flexible,                            transoral; with biopsy, single or multiple Diagnosis Code(s):        --- Professional ---                           R13.10, Dysphagia, unspecified                           K29.61, Other gastritis with bleeding CPT copyright 2019 American Medical Association. All rights reserved. The codes documented in this report are preliminary and upon coder review may  be revised to meet current compliance requirements. Mauri Pole, MD 08/30/2018 12:36:04 PM This report has been signed  electronically. Number of Addenda: 0

## 2018-08-30 NOTE — Progress Notes (Signed)
Patient back to floor, no distress noted, bed alarm on and bed in lowest position. VSS

## 2018-08-30 NOTE — Progress Notes (Signed)
   Subjective: Patient doing well this afternoon. She just came back from her EGD. We discussed the results of her EGD. Discussed rheumatology recommendations from [facility]. Discussed placing an NG tube for now then replacing CorTrak on Monday. Daughter and patient voice understanding. All questions and concern addressed.   Objective: Vital signs in last 24 hours: Vitals:   08/30/18 1226 08/30/18 1235 08/30/18 1245 08/30/18 1316  BP: 110/64 128/78 131/78 131/78  Pulse: 88 80 77 75  Resp: (!) 27 (!) 23 16 16   Temp: 98.4 F (36.9 C)  98 F (36.7 C) 98 F (36.7 C)  TempSrc: Temporal  Oral Oral  SpO2: 97% 98% 99% 99%  Weight:      Height:       General: Elderly female, appears tired Pulm: Good air movement with no wheezing or crackles  CV: RRR, no murmurs, no rubs   Assessment/Plan:  Lisa Mcdowell is a 68 y.o female with presumed adult onset dermatomyositis who presented to the ED on 8/10 with decreased PO intake and concerns of dysphagia. She was subsequently admitted for further evaluation and management.   Dermatomyositis  Dysphagia, oropharyngeal  - Patient is having proximal muscle pain this AM  - Objectively her CK and LFTs are down trending  - S/p EGD that did not illustrate any structural cause of her dysphagia. Did show some gastritis - Appreciate GI recommendations.  - Continue IV PPI BID  - NG tube came out during EGD. Will replace today and plan for CorTrac replacement on Monday.  - Appreciate speech therapist assistance  - Case discussed with Rheumatology (Dr. Sharlette Dense, [phone]) at [facility] on 8/14. They recommended starting the patient on SoluMedrol 40 mg IV BID. Recommended transfer for EMG and muscle biopsy. Unfortunately they are at capacity. Will continue to try to facilitate a transfer.   DM - CBG at goal  - Continue SSI  Dispo: Anticipated discharge pending clinical improvement or transfer.   [name], MD 08/30/2018, 1:23 PM

## 2018-08-30 NOTE — Transfer of Care (Signed)
Immediate Anesthesia Transfer of Care Note  Patient: Lisa Mcdowell  Procedure(s) Performed: ESOPHAGOGASTRODUODENOSCOPY (EGD) WITH PROPOFOL (N/A ) BIOPSY  Patient Location: Endoscopy Unit  Anesthesia Type:MAC  Level of Consciousness: awake, alert  and oriented  Airway & Oxygen Therapy: Patient Spontanous Breathing and Patient connected to nasal cannula oxygen  Post-op Assessment: Report given to RN and Post -op Vital signs reviewed and stable  Post vital signs: Reviewed and stable  Last Vitals:  Vitals Value Taken Time  BP 110/64 08/30/18 1226  Temp 36.9 C 08/30/18 1226  Pulse 88 08/30/18 1227  Resp 26 08/30/18 1227  SpO2 97 % 08/30/18 1227  Vitals shown include unvalidated device data.  Last Pain:  Vitals:   08/30/18 1226  TempSrc: Temporal  PainSc: 0-No pain         Complications: No apparent anesthesia complications

## 2018-08-30 NOTE — Plan of Care (Signed)
  Problem: Education: Goal: Knowledge of General Education information will improve Description Including pain rating scale, medication(s)/side effects and non-pharmacologic comfort measures Outcome: Progressing   

## 2018-08-30 NOTE — Anesthesia Postprocedure Evaluation (Signed)
Anesthesia Post Note  Patient: Lisa Mcdowell  Procedure(s) Performed: ESOPHAGOGASTRODUODENOSCOPY (EGD) WITH PROPOFOL (N/A ) BIOPSY     Patient location during evaluation: Endoscopy Anesthesia Type: MAC Level of consciousness: awake and alert Pain management: pain level controlled Vital Signs Assessment: post-procedure vital signs reviewed and stable Respiratory status: spontaneous breathing, nonlabored ventilation and respiratory function stable Cardiovascular status: stable and blood pressure returned to baseline Postop Assessment: no apparent nausea or vomiting Anesthetic complications: no    Last Vitals:  Vitals:   08/30/18 1245 08/30/18 1316  BP: 131/78 131/78  Pulse: 77 75  Resp: 16 16  Temp: 36.7 C 36.7 C  SpO2: 99% 99%    Last Pain:  Vitals:   08/30/18 1316  TempSrc: Oral  PainSc: 0-No pain                 Djimon Lundstrom,W. EDMOND

## 2018-08-30 NOTE — Anesthesia Preprocedure Evaluation (Addendum)
Anesthesia Evaluation  Patient identified by MRN, date of birth, ID band Patient awake    Reviewed: Allergy & Precautions, H&P , NPO status , Patient's Chart, lab work & pertinent test results  Airway Mallampati: II  TM Distance: >3 FB Neck ROM: Full    Dental no notable dental hx. (+) Edentulous Upper, Edentulous Lower, Dental Advisory Given   Pulmonary neg pulmonary ROS,    Pulmonary exam normal breath sounds clear to auscultation       Cardiovascular negative cardio ROS   Rhythm:Regular Rate:Normal     Neuro/Psych negative neurological ROS  negative psych ROS   GI/Hepatic Neg liver ROS, GERD  ,  Endo/Other  negative endocrine ROS  Renal/GU negative Renal ROS  negative genitourinary   Musculoskeletal   Abdominal   Peds  Hematology negative hematology ROS (+)   Anesthesia Other Findings   Reproductive/Obstetrics negative OB ROS                            Anesthesia Physical Anesthesia Plan  ASA: II  Anesthesia Plan: MAC   Post-op Pain Management:    Induction: Intravenous  PONV Risk Score and Plan: 2 and Propofol infusion and Treatment may vary due to age or medical condition  Airway Management Planned: Nasal Cannula  Additional Equipment:   Intra-op Plan:   Post-operative Plan:   Informed Consent: I have reviewed the patients History and Physical, chart, labs and discussed the procedure including the risks, benefits and alternatives for the proposed anesthesia with the patient or authorized representative who has indicated his/her understanding and acceptance.     Dental advisory given  Plan Discussed with: CRNA  Anesthesia Plan Comments:         Anesthesia Quick Evaluation

## 2018-08-30 NOTE — Progress Notes (Signed)
When we went in room to increase the NG tube about 10 cm she decided she would just wait till Monday for the cortrak, MD being paged

## 2018-08-31 DIAGNOSIS — E876 Hypokalemia: Secondary | ICD-10-CM

## 2018-08-31 LAB — COMPREHENSIVE METABOLIC PANEL
ALT: 142 U/L — ABNORMAL HIGH (ref 0–44)
AST: 373 U/L — ABNORMAL HIGH (ref 15–41)
Albumin: 2 g/dL — ABNORMAL LOW (ref 3.5–5.0)
Alkaline Phosphatase: 38 U/L (ref 38–126)
Anion gap: 7 (ref 5–15)
BUN: 7 mg/dL — ABNORMAL LOW (ref 8–23)
CO2: 22 mmol/L (ref 22–32)
Calcium: 7.1 mg/dL — ABNORMAL LOW (ref 8.9–10.3)
Chloride: 108 mmol/L (ref 98–111)
Creatinine, Ser: 0.35 mg/dL — ABNORMAL LOW (ref 0.44–1.00)
GFR calc Af Amer: >60 mL/min (ref >60)
GFR calc non Af Amer: >60 mL/min (ref >60)
Glucose, Bld: 119 mg/dL — ABNORMAL HIGH (ref 70–99)
Potassium: 2.7 mmol/L — CL (ref 3.5–5.1)
Sodium: 137 mmol/L (ref 135–145)
Total Bilirubin: 0.9 mg/dL (ref 0.3–1.2)
Total Protein: 4.4 g/dL — ABNORMAL LOW (ref 6.5–8.1)

## 2018-08-31 LAB — GLUCOSE, CAPILLARY
Glucose-Capillary: 149 mg/dL — ABNORMAL HIGH (ref 70–99)
Glucose-Capillary: 130 mg/dL — ABNORMAL HIGH (ref 70–99)
Glucose-Capillary: 113 mg/dL — ABNORMAL HIGH (ref 70–99)
Glucose-Capillary: 94 mg/dL (ref 70–99)
Glucose-Capillary: 140 mg/dL — ABNORMAL HIGH (ref 70–99)
Glucose-Capillary: 159 mg/dL — ABNORMAL HIGH (ref 70–99)

## 2018-08-31 LAB — CK: Total CK: 5218 U/L — ABNORMAL HIGH (ref 38–234)

## 2018-08-31 MED ORDER — POTASSIUM CHLORIDE 10 MEQ/100ML IV SOLN
10.0000 meq | INTRAVENOUS | Status: AC
  Administered 2018-08-31 (×6): 10 meq via INTRAVENOUS
  Filled 2018-08-31 (×6): qty 100

## 2018-08-31 NOTE — Progress Notes (Signed)
CRITICAL VALUE ALERT  Critical Value:  Potassium 2.7  Date & Time Notied:  8/16 0750  Provider Notified: yes  Orders Received/Actions taken: 6 runs of IV potassium

## 2018-08-31 NOTE — Progress Notes (Signed)
   Subjective: Patient is doing okay this AM. She is having some throat discomfort. Her shoulder pain is improving but she feels swollen in her arms. She was unable to tolerate the NG tube yesterday. We will plan for a Cortrac tomorrow. Discussed that we are continuing to monitor her labs and her clinical response. All questions and concerns addressed.   Objective: Vital signs in last 24 hours: Vitals:   08/30/18 2012 08/30/18 2321 08/31/18 0342 08/31/18 0840  BP: 139/80 (!) 129/96 124/79 127/78  Pulse: 84 79 77 70  Resp: 18 17 18 19   Temp: 98.8 F (37.1 C) 98.2 F (36.8 C) 98.5 F (36.9 C) 98.6 F (37 C)  TempSrc: Oral Oral Oral Oral  SpO2: 96% 98% 99% 96%  Weight:      Height:       General: Elderly female, appears tired Pulm: Good air movement with no wheezing or crackles  CV: RRR, no murmurs, no rubs   Assessment/Plan:  Lisa Mcdowell is a 68 y.o female with presumed adult onset dermatomyositis who presented to the ED on 8/10 with decreased PO intake and concerns of dysphagia. She was subsequently admitted for further evaluation and management.   Dermatomyositis  Dysphagia, oropharyngeal  - Patient's pain is improving but she continues to have upper extremity swelling  - She has had a bump in her CK and LFTs  - Continue IV PPI BID  - Plan for CorTrac replacement on Monday. Appreciate speech therapist assistance  - Continue SoluMedrol 40 mg IV BID  - Case discussed with Rheumatology (Dr. Sharlette Dense, [phone]) at [facility] on 8/14. Recommended transfer for EMG and muscle biopsy. Unfortunately they are at capacity. Will continue to try to facilitate a transfer.   DM - CBG at goal  - Continue SSI  Hypokalemia  - Replacing potassium   Dispo: Anticipated discharge pending clinical improvement or transfer.   Lisa Homes, MD 08/31/2018, 11:17 AM

## 2018-09-01 ENCOUNTER — Inpatient Hospital Stay (HOSPITAL_COMMUNITY): Payer: Medicaid Other

## 2018-09-01 DIAGNOSIS — R1312 Dysphagia, oropharyngeal phase: Secondary | ICD-10-CM

## 2018-09-01 LAB — COMPREHENSIVE METABOLIC PANEL
ALT: 150 U/L — ABNORMAL HIGH (ref 0–44)
AST: 361 U/L — ABNORMAL HIGH (ref 15–41)
Albumin: 2.1 g/dL — ABNORMAL LOW (ref 3.5–5.0)
Alkaline Phosphatase: 42 U/L (ref 38–126)
Anion gap: 8 (ref 5–15)
BUN: 7 mg/dL — ABNORMAL LOW (ref 8–23)
CO2: 26 mmol/L (ref 22–32)
Calcium: 4.9 mg/dL — CL (ref 8.9–10.3)
Chloride: 104 mmol/L (ref 98–111)
Creatinine, Ser: 0.41 mg/dL — ABNORMAL LOW (ref 0.44–1.00)
GFR calc Af Amer: >60 mL/min (ref >60)
GFR calc non Af Amer: >60 mL/min (ref >60)
Glucose, Bld: 125 mg/dL — ABNORMAL HIGH (ref 70–99)
Potassium: 5.4 mmol/L — ABNORMAL HIGH (ref 3.5–5.1)
Sodium: 138 mmol/L (ref 135–145)
Total Bilirubin: 0.8 mg/dL (ref 0.3–1.2)
Total Protein: 4.7 g/dL — ABNORMAL LOW (ref 6.5–8.1)

## 2018-09-01 LAB — BASIC METABOLIC PANEL
Anion gap: 9 (ref 5–15)
BUN: 8 mg/dL (ref 8–23)
CO2: 23 mmol/L (ref 22–32)
Calcium: 7.9 mg/dL — ABNORMAL LOW (ref 8.9–10.3)
Chloride: 104 mmol/L (ref 98–111)
Creatinine, Ser: 0.44 mg/dL (ref 0.44–1.00)
GFR calc Af Amer: >60 mL/min (ref >60)
GFR calc non Af Amer: >60 mL/min (ref >60)
Glucose, Bld: 152 mg/dL — ABNORMAL HIGH (ref 70–99)
Potassium: 4 mmol/L (ref 3.5–5.1)
Sodium: 136 mmol/L (ref 135–145)

## 2018-09-01 LAB — CK: Total CK: 4682 U/L — ABNORMAL HIGH (ref 38–234)

## 2018-09-01 LAB — GLUCOSE, CAPILLARY
Glucose-Capillary: 119 mg/dL — ABNORMAL HIGH (ref 70–99)
Glucose-Capillary: 131 mg/dL — ABNORMAL HIGH (ref 70–99)
Glucose-Capillary: 128 mg/dL — ABNORMAL HIGH (ref 70–99)
Glucose-Capillary: 113 mg/dL — ABNORMAL HIGH (ref 70–99)
Glucose-Capillary: 83 mg/dL (ref 70–99)

## 2018-09-01 LAB — PHOSPHORUS: Phosphorus: 3.3 mg/dL (ref 2.5–4.6)

## 2018-09-01 LAB — MAGNESIUM: Magnesium: 1.3 mg/dL — ABNORMAL LOW (ref 1.7–2.4)

## 2018-09-01 MED ORDER — GADOBUTROL 1 MMOL/ML IV SOLN
7.0000 mL | Freq: Once | INTRAVENOUS | Status: AC | PRN
  Administered 2018-09-01: 7 mL via INTRAVENOUS

## 2018-09-01 MED ORDER — MAGNESIUM SULFATE 4 GM/100ML IV SOLN
4.0000 g | Freq: Once | INTRAVENOUS | Status: AC
  Administered 2018-09-01: 4 g via INTRAVENOUS
  Filled 2018-09-01: qty 100

## 2018-09-01 NOTE — Consult Note (Signed)
[facility] Surgery Consult Note  Lisa Mcdowell 1950/07/20  [id number].    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 She was hospitalized for this in July initially complaining of a progressive rash that spread to her back, legs and scalp, as well as loss of appetite and difficulty walking due to joint pain. Patient was started on IV steroids then discharged home on p.o. steroids and set to follow up with rheumatology on 8/18.  She presented back to [facility] on August 10 with complaints of dysphasia that began 3 to 4 days prior to admission and had significantly worsened during that short time period.  Currently she has a Cortrak in place for tube feedings. General surgery asked to see for a muscle biopsy to confirm diagnosis of dermatomyositis, as this will help guide treatment.  Anticoagulants: none Nonsmoker Daughter in room who helped with translating   ROS: Review of Systems  Constitutional: Positive for malaise/fatigue.  HENT: Negative.   Eyes: Negative.   Respiratory: Negative.   Cardiovascular: Negative.   Gastrointestinal: Negative.   Genitourinary: Negative.   Musculoskeletal: Positive for joint pain.  Skin: Positive for rash.  Neurological: Positive for focal weakness.    All systems reviewed and otherwise negative except for as above  Family History  Problem Relation Age of Onset  . Colon cancer Neg Hx     Past Medical History:  Diagnosis Date  . Cholelithiasis   . Dermatomycosis   . Gallstones 08/08/2010  . GERD (gastroesophageal reflux disease)    pt denies having GERD on day of surgery  . Hyperlipidemia   . Nausea & vomiting     Past Surgical History:  Procedure Laterality Date  . CHOLECYSTECTOMY  08/08/2010   Procedure: LAPAROSCOPIC CHOLECYSTECTOMY;  Surgeon: Scherry Ran;  Location: [facility];  Service: General;  Laterality: N/A;  . COLONOSCOPY N/A 04/15/2015   Procedure: COLONOSCOPY;  Surgeon: Danie Binder, MD;  Location: [facility];  Service: Endoscopy;  Laterality: N/A;  1015 - moved to 10:00 - office to notify - interpreter scheduled, do NOT change time  . INSERTION OF MESH N/A 01/23/2018   Procedure: INSERTION OF MESH;  Surgeon: Donnie Mesa, MD;  Location: [place];  Service: General;  Laterality: N/A;  . UMBILICAL HERNIA REPAIR N/A 01/23/2018   Procedure: UMBILICAL HERNIA REPAIR WITH MESH;  Surgeon: Donnie Mesa, MD;  Location: [place];  Service: General;  Laterality: N/A;  . VARICOSE VEIN SURGERY  2011   [facility]    Social History:  reports that she has never smoked. She has never used smokeless tobacco. She reports that she does not drink alcohol or use drugs.  Allergies: No Known Allergies  Medications Prior to Admission  Medication Sig Dispense Refill  . Acetylcysteine (NAC PO) Take 1,000 mg by mouth 3 (three) times daily.    Marland Kitchen amitriptyline (ELAVIL) 25 MG tablet Take 25 mg by mouth at bedtime as needed (muscle and joint pain).     . clobetasol ointment (TEMOVATE) 0.05 % Apply topically 2 (two) times daily. (Patient taking differently: Apply 1 application topically 2 (two) times daily as needed (itching). ) 30 g 0  . doxepin (SINEQUAN) 25 MG capsule Take 25 mg by mouth 3 (three) times daily.    . hydrocortisone cream 1 % Apply topically at bedtime  as needed for itching. (Patient taking differently: Apply 1 application topically at bedtime as needed for itching. ) 30 g 0  . hydrOXYzine (ATARAX/VISTARIL) 25 MG tablet Take 1 tablet (25 mg total) by mouth every 8 (eight) hours as needed for itching. 15 tablet 0  . pantoprazole (PROTONIX) 40 MG tablet Take 1 tablet (40 mg total) by mouth daily. 30 tablet 0  . predniSONE (DELTASONE) 10 MG tablet Take 7 tablets (70 mg total) by mouth daily with breakfast. 210  tablet 0  . senna-docusate (SENOKOT-S) 8.6-50 MG tablet Take 1 tablet by mouth at bedtime as needed for mild constipation or moderate constipation. 30 tablet 0  . Vitamin D, Ergocalciferol, (DRISDOL) 1.25 MG (50000 UT) CAPS capsule Take 50,000 Units by mouth every Wednesday.      Prior to Admission medications   Medication Sig Start Date End Date Taking? Authorizing Provider  Acetylcysteine (NAC PO) Take 1,000 mg by mouth 3 (three) times daily.   Yes [provider]  amitriptyline (ELAVIL) 25 MG tablet Take 25 mg by mouth at bedtime as needed (muscle and joint pain).  08/06/18  Yes [provider]  clobetasol ointment (TEMOVATE) 0.05 % Apply topically 2 (two) times daily. Patient taking differently: Apply 1 application topically 2 (two) times daily as needed (itching).  08/15/18  Yes Earlene Plater, MD  doxepin (SINEQUAN) 25 MG capsule Take 25 mg by mouth 3 (three) times daily. 07/11/18  Yes [provider]  hydrocortisone cream 1 % Apply topically at bedtime as needed for itching. Patient taking differently: Apply 1 application topically at bedtime as needed for itching.  08/15/18  Yes Earlene Plater, MD  hydrOXYzine (ATARAX/VISTARIL) 25 MG tablet Take 1 tablet (25 mg total) by mouth every 8 (eight) hours as needed for itching. 08/02/18  Yes Horton, Barbette Hair, MD  pantoprazole (PROTONIX) 40 MG tablet Take 1 tablet (40 mg total) by mouth daily. 08/16/18  Yes Earlene Plater, MD  predniSONE (DELTASONE) 10 MG tablet Take 7 tablets (70 mg total) by mouth daily with breakfast. 08/16/18  Yes Earlene Plater, MD  senna-docusate (SENOKOT-S) 8.6-50 MG tablet Take 1 tablet by mouth at bedtime as needed for mild constipation or moderate constipation. 08/15/18 09/14/18 Yes Earlene Plater, MD  Vitamin D, Ergocalciferol, (DRISDOL) 1.25 MG (50000 UT) CAPS capsule Take 50,000 Units by mouth every Wednesday.   Yes [provider]    Blood pressure 126/69, pulse 74,  temperature 98.3 F (36.8 C), temperature source Oral, resp. rate 20, height 5\' 6"  (1.676 m), weight 78 kg, SpO2 97 %. Physical Exam: General: pleasant, WD/WN white female who is laying in bed in NAD HEENT: head is normocephalic, atraumatic.  Sclera are noninjected.  Pupils equal and round.  Ears and nose without any masses or lesions.  Mouth is pink and moist. Dentition fair. Cortrak in place Heart: regular, rate, and rhythm.  No obvious murmurs, gallops, or rubs noted.  Palpable pedal pulses bilaterally Lungs: CTAB, no wheezes, rhonchi, or rales noted.  Respiratory effort nonlabored Abd: soft, NT/ND, +BS, no masses, hernias, or organomegaly MS: no gross deformities. calves soft and nontender without edema. Weakness noted to BUE and R>L lower extremities Skin: warm and dry with no masses or lesions. Rash not to bilateral upper arms and lower extremities Psych: A&Ox3 with an appropriate affect. Neuro: cranial nerves grossly intact, normal speech, moving all 4 extremities  Results for orders placed or performed during the hospital encounter of 08/25/18 (from the past 48 hour(s))  Glucose, capillary  Status: None   Collection Time: 08/30/18  4:36 PM  Result Value Ref Range   Glucose-Capillary 74 70 - 99 mg/dL  Glucose, capillary     Status: Abnormal   Collection Time: 08/30/18  8:10 PM  Result Value Ref Range   Glucose-Capillary 116 (H) 70 - 99 mg/dL  Glucose, capillary     Status: Abnormal   Collection Time: 08/30/18 11:21 PM  Result Value Ref Range   Glucose-Capillary 140 (H) 70 - 99 mg/dL   Comment 1 Notify RN   Glucose, capillary     Status: Abnormal   Collection Time: 08/31/18  3:41 AM  Result Value Ref Range   Glucose-Capillary 149 (H) 70 - 99 mg/dL  CK     Status: Abnormal   Collection Time: 08/31/18  6:45 AM  Result Value Ref Range   Total CK 5,218 (H) 38 - 234 U/L    Comment: RESULTS CONFIRMED BY MANUAL DILUTION Performed at [facility], [place] [address]  [place], [place] [address]   Comprehensive metabolic panel     Status: Abnormal   Collection Time: 08/31/18  6:45 AM  Result Value Ref Range   Sodium 137 135 - 145 mmol/L   Potassium 2.7 (LL) 3.5 - 5.1 mmol/L    Comment: CRITICAL RESULT CALLED TO, READ BACK BY AND VERIFIED WITH: [facility] MENDEZ RN AT 6045 08/31/2018 BY WOOLLENK    Chloride 108 98 - 111 mmol/L   CO2 22 22 - 32 mmol/L   Glucose, Bld 119 (H) 70 - 99 mg/dL   BUN 7 (L) 8 - 23 mg/dL   Creatinine, Ser 0.35 (L) 0.44 - 1.00 mg/dL   Calcium 7.1 (L) 8.9 - 10.3 mg/dL   Total Protein 4.4 (L) 6.5 - 8.1 g/dL   Albumin 2.0 (L) 3.5 - 5.0 g/dL   AST 373 (H) 15 - 41 U/L   ALT 142 (H) 0 - 44 U/L   Alkaline Phosphatase 38 38 - 126 U/L   Total Bilirubin 0.9 0.3 - 1.2 mg/dL   GFR calc non Af Amer >60 >60 mL/min   GFR calc Af Amer >60 >60 mL/min   Anion gap 7 5 - 15    Comment: Performed at [facility], [place] [address], [place], [place] [address]  Glucose, capillary     Status: Abnormal   Collection Time: 08/31/18  9:19 AM  Result Value Ref Range   Glucose-Capillary 130 (H) 70 - 99 mg/dL   Comment 1 Notify RN    Comment 2 Document in Chart   Glucose, capillary     Status: Abnormal   Collection Time: 08/31/18 12:15 PM  Result Value Ref Range   Glucose-Capillary 113 (H) 70 - 99 mg/dL  Glucose, capillary     Status: None   Collection Time: 08/31/18  3:48 PM  Result Value Ref Range   Glucose-Capillary 94 70 - 99 mg/dL  Glucose, capillary     Status: Abnormal   Collection Time: 08/31/18  7:53 PM  Result Value Ref Range   Glucose-Capillary 140 (H) 70 - 99 mg/dL  Glucose, capillary     Status: Abnormal   Collection Time: 08/31/18 11:12 PM  Result Value Ref Range   Glucose-Capillary 159 (H) 70 - 99 mg/dL  Glucose, capillary     Status: Abnormal   Collection Time: 09/01/18  3:29 AM  Result Value Ref Range   Glucose-Capillary 119 (H) 70 - 99 mg/dL   Comment 1 Notify RN   Comprehensive  metabolic panel     Status: Abnormal    Collection Time: 09/01/18  6:33 AM  Result Value Ref Range   Sodium 138 135 - 145 mmol/L   Potassium 5.4 (H) 3.5 - 5.1 mmol/L   Chloride 104 98 - 111 mmol/L   CO2 26 22 - 32 mmol/L   Glucose, Bld 125 (H) 70 - 99 mg/dL   BUN 7 (L) 8 - 23 mg/dL   Creatinine, Ser 0.41 (L) 0.44 - 1.00 mg/dL   Calcium 4.9 (LL) 8.9 - 10.3 mg/dL    Comment: REPEATED TO VERIFY CRITICAL RESULT CALLED TO, READ BACK BY AND VERIFIED WITH: SONYA BLEDSOE,RN AT 0803 09/01/2018 BY ZBEECH.    Total Protein 4.7 (L) 6.5 - 8.1 g/dL   Albumin 2.1 (L) 3.5 - 5.0 g/dL   AST 361 (H) 15 - 41 U/L   ALT 150 (H) 0 - 44 U/L   Alkaline Phosphatase 42 38 - 126 U/L   Total Bilirubin 0.8 0.3 - 1.2 mg/dL   GFR calc non Af Amer >60 >60 mL/min   GFR calc Af Amer >60 >60 mL/min   Anion gap 8 5 - 15    Comment: Performed at [facility] [address] [place], [place] [address]  CK     Status: Abnormal   Collection Time: 09/01/18  6:33 AM  Result Value Ref Range   Total CK 4,682 (H) 38 - 234 U/L    Comment: RESULTS CONFIRMED BY MANUAL DILUTION Performed at [facility], [place] [address], [place], [place] [address]   Magnesium     Status: Abnormal   Collection Time: 09/01/18  6:33 AM  Result Value Ref Range   Magnesium 1.3 (L) 1.7 - 2.4 mg/dL    Comment: Performed at [facility] [address], [place], [place] [address]  Glucose, capillary     Status: Abnormal   Collection Time: 09/01/18  7:58 AM  Result Value Ref Range   Glucose-Capillary 131 (H) 70 - 99 mg/dL   Comment 1 Notify RN    Comment 2 Document in Chart   Basic metabolic panel     Status: Abnormal   Collection Time: 09/01/18 10:11 AM  Result Value Ref Range   Sodium 136 135 - 145 mmol/L   Potassium 4.0 3.5 - 5.1 mmol/L   Chloride 104 98 - 111 mmol/L   CO2 23 22 - 32 mmol/L   Glucose, Bld 152 (H) 70 - 99 mg/dL   BUN 8 8 - 23 mg/dL   Creatinine, Ser 0.44 0.44 - 1.00 mg/dL   Calcium 7.9 (L) 8.9 - 10.3 mg/dL   GFR calc non Af Amer >60 >60  mL/min   GFR calc Af Amer >60 >60 mL/min   Anion gap 9 5 - 15    Comment: Performed at [facility], [place] [address], [place], [place] [address]  Phosphorus     Status: None   Collection Time: 09/01/18 10:11 AM  Result Value Ref Range   Phosphorus 3.3 2.5 - 4.6 mg/dL    Comment: Performed at [facility] [address], [place], [place] [address]  Glucose, capillary     Status: Abnormal   Collection Time: 09/01/18 12:09 PM  Result Value Ref Range   Glucose-Capillary 128 (H) 70 - 99 mg/dL   Comment 1 Notify RN    Comment 2 Document in Chart    Dg Abd Portable 1v  Result Date: 09/01/2018 CLINICAL  DATA:  Tube placement. EXAM: PORTABLE ABDOMEN - 1 VIEW COMPARISON:  Plain film of the abdomen dated 08/30/2018. FINDINGS: Enteric tube in the stomach with tip directed towards the stomach pylorus/proximal duodenum. Visualized bowel gas pattern is nonobstructive. IMPRESSION: Enteric tube in the stomach with tip directed towards the stomach pylorus/proximal duodenum. Electronically Signed   By: Franki Cabot M.D.   On: 09/01/2018 11:28   Anti-infectives (From admission, onward)   None        Assessment/Plan DM HLD GERD Dysphagia  Dermatomyositis Extremity weakness Rash - General surgery asked to see for a muscle biopsy to confirm diagnosis of dermatomyositis which would allow for more aggressive treatment. Primary team states that they are going to obtain further imaging to determine which extremity they would like the biopsy from. Depending on other more emergent cases, may be able to proceed with surgery on Wednesday.  ID - none VTE - SCDs, lovenox FEN - IVF, NPO, TF Foley - none Follow up - TBD  [place], [facility] Surgery 09/01/2018, 3:11 PM Pager: [phone] Mon-Thurs 7:00 am-4:30 pm Fri 7:00 am -11:30 AM Sat-Sun 7:00 am-11:30 am

## 2018-09-01 NOTE — Progress Notes (Addendum)
Subjective:   She reports she is doing okay this morning.  She continues to have left upper extremity weakness and heaviness but says it is a little better since when she was admitted.  She also states she is feeling warm.  Patient notes bilateral lower extremity numbness that occurs at night but states is been happening every night since she is been here.  She also complains of left lower extremity, ankle pain.  Denies chest pain, shortness of breath, nausea, vomiting, abdominal pain, peri-oral numbness, muscle cramps, paresthesia.   We discussed that the plan for today is to reinsert the NG tube and she is in agreement.  No questions or concerns at this time.  Objective:  Vital signs in last 24 hours: Vitals:   08/31/18 2352 09/01/18 0330 09/01/18 0626 09/01/18 0809  BP: 130/62 122/75  127/71  Pulse: 79 86  70  Resp: 20 19  20   Temp: 98.2 F (36.8 C) 97.9 F (36.6 C)  98.2 F (36.8 C)  TempSrc: Oral Oral  Oral  SpO2: 98% 96%  97%  Weight:   78 kg   Height:       Physical Exam Constitutional:      General: She is not in acute distress.    Appearance: She is normal weight. She is not toxic-appearing.  Cardiovascular:     Rate and Rhythm: Normal rate and regular rhythm.     Heart sounds: No murmur. No gallop.   Pulmonary:     Effort: Pulmonary effort is normal. No respiratory distress.     Breath sounds: Normal breath sounds. No wheezing or rales.  Abdominal:     General: Bowel sounds are normal.     Palpations: Abdomen is soft.  Musculoskeletal:        General: No swelling, tenderness, deformity or signs of injury.     Right lower leg: No edema.     Left lower leg: No edema.  Skin:    General: Skin is warm and dry.  Neurological:     Mental Status: She is alert and oriented to person, place, and time. Mental status is at baseline.     Comments: No facial twitching to palpation.   Psychiatric:        Mood and Affect: Mood normal.        Behavior: Behavior normal.     Assessment/Plan:  Principal Problem:   Dysphagia Active Problems:   Secondary rhabdomyolysis   Adult type dermatomyositis (HCC)   Transaminitis   Elevated liver enzymes  #Dysphagia, Oropharyngeal #Dermatomyositis [name] is admitted with complaints of dysphagia in light of recent dermatomyositis diagnosis and treatment with prednisone 60 mg, taken only for approximately 2 weeks before dysphasia onset. SLP consulted and modified barium swallow showed oropharyngeal dysfunction without no obvious esophageal involvment.  Literature shows this can be due to the skeletal muscle dysfunction. GI was consulted for EGD that did not show any esophageal abnormalities.   Difficult to discern if this is secondary to her dermatomyositis or other unrelated process.  Study showed that symptoms can improve dramatically with IVIG, will discuss this with [facility] rheumatology.  Initial medications also include methotrexate, will discuss this as well.  CK is improving everyday without any changes in dysphagia.  - Solumedrol 40mg  BID - NG tube adjustment today.  - Resume tube feeds per dietician orders   #Secondary Rhadomyolysis #Elevated LFTs Secondary to dermatomyositis. Abdominal ultrasound showed hepatic steatosis, no other abnormal findings. CK is continuing to improve  with steroid treatment.   - Trend LFTs - Trend CK - IV Fluids   #Hypocalcemia:  Patient's labs today returned with a severely low potassium of 4.9.  Corrected calcium of 6.4.  Patient's calcium yesterday was normal when corrected.  No signs or symptoms of hypocalcemia, including muscle spasms, numbness, EKG changes. Hypomagnesemia could be contributing.  - Repeat BMP and phosphorus to confirm  - Magnesium sulfate 4g IV   Dispo: Anticipated discharge pending medical improvement  Dr. Jose Persia Internal Medicine PGY-1  Pager: [phone] 09/01/2018, 9:56 AM

## 2018-09-01 NOTE — Procedures (Signed)
Cortrak  Tube Type:  Cortrak - 43 inches Tube Location:  Right nare Initial Placement:  Stomach Secured by: Bridle Technique Used to Measure Tube Placement:  Documented cm marking at nare/ corner of mouth Cortrak Secured At:  65 cm    Cortrak Tube Team Note:  Consult received to place a Cortrak feeding tube.   X-ray is required, abdominal x-ray has been ordered by the Cortrak team. Please confirm tube placement before using the Cortrak tube.   If the tube becomes dislodged please keep the tube and contact the Cortrak team at [email or web address] (password [email or web address]) for replacement.  If after hours and replacement cannot be delayed, place a NG tube and confirm placement with an abdominal x-ray.   Alexiya Franqui MS, RD, LDN Pager #- [phone] Office#- [phone] After Hours Pager: [phone]   

## 2018-09-01 NOTE — Progress Notes (Signed)
  Speech Language Pathology Treatment: Dysphagia  Patient Details Name: Lisa Mcdowell MRN: [id number] DOB: 1950-06-04 Today's Date: 09/01/2018 Time: 0900-0920 SLP Time Calculation (min) (ACUTE ONLY): 20 min  Assessment / Plan / Recommendation Clinical Impression  Online translator service was used for this session. Pt continues to present with oropharyngeal dysphagia 2/2 edema and muscle weakness.  Pt was unable to swallow ice chip or very small amount of thin liquid by spoon, despite very visible effort.  Pt expectorated all bolus trials. Chin tuck was not beneficial; although pt commented that it had helped her prior to admission with bottle of water. Pt commented that she experienced odynophagia during that time.  No complaints of pain this session.  Pt is also having difficulty swallowing secretions and is expectorating saliva at this time.  Pt is very eager to resume PO intake and is frustrated by her current condition.  Pt became tearful at end of session.  Per MD lab values are improving and muscle function should began to slowly improve.  Plan to place NG today.     HPI HPI: This is a 68 year old female with a PMH significant for dermatomysitis which appears to be a fairly new diagnosis for her. Patient was recently hospitalized for 7/26-7/31 for dermatomyositis and was subsequently treated with IV steroids.  She is discharged with p.o. prednisone.  Patient presented to the ED with a 4 day h/o progressively worsening dysphagia characterized by globus.       SLP Plan  Continue with current plan of care       Recommendations  Diet recommendations: NPO Medication Administration: Via alternative means                Oral Care Recommendations: Oral care QID SLP Visit Diagnosis: Dysphagia, oropharyngeal phase (R13.12) Plan: Continue with current plan of care       [place], [place], [place] Office:  [phone] 09/01/2018, 9:27 AM

## 2018-09-02 ENCOUNTER — Encounter (HOSPITAL_COMMUNITY): Payer: Self-pay | Admitting: Gastroenterology

## 2018-09-02 LAB — COMPREHENSIVE METABOLIC PANEL
ALT: 147 U/L — ABNORMAL HIGH (ref 0–44)
AST: 344 U/L — ABNORMAL HIGH (ref 15–41)
Albumin: 2.1 g/dL — ABNORMAL LOW (ref 3.5–5.0)
Alkaline Phosphatase: 43 U/L (ref 38–126)
Anion gap: 9 (ref 5–15)
BUN: 12 mg/dL (ref 8–23)
CO2: 24 mmol/L (ref 22–32)
Calcium: 7.5 mg/dL — ABNORMAL LOW (ref 8.9–10.3)
Chloride: 101 mmol/L (ref 98–111)
Creatinine, Ser: 0.4 mg/dL — ABNORMAL LOW (ref 0.44–1.00)
GFR calc Af Amer: >60 mL/min (ref >60)
GFR calc non Af Amer: >60 mL/min (ref >60)
Glucose, Bld: 114 mg/dL — ABNORMAL HIGH (ref 70–99)
Potassium: 3.3 mmol/L — ABNORMAL LOW (ref 3.5–5.1)
Sodium: 134 mmol/L — ABNORMAL LOW (ref 135–145)
Total Bilirubin: 0.8 mg/dL (ref 0.3–1.2)
Total Protein: 4.8 g/dL — ABNORMAL LOW (ref 6.5–8.1)

## 2018-09-02 LAB — GLUCOSE, CAPILLARY
Glucose-Capillary: 116 mg/dL — ABNORMAL HIGH (ref 70–99)
Glucose-Capillary: 127 mg/dL — ABNORMAL HIGH (ref 70–99)
Glucose-Capillary: 152 mg/dL — ABNORMAL HIGH (ref 70–99)
Glucose-Capillary: 180 mg/dL — ABNORMAL HIGH (ref 70–99)
Glucose-Capillary: 182 mg/dL — ABNORMAL HIGH (ref 70–99)
Glucose-Capillary: 190 mg/dL — ABNORMAL HIGH (ref 70–99)

## 2018-09-02 LAB — CK: Total CK: 5277 U/L — ABNORMAL HIGH (ref 38–234)

## 2018-09-02 MED ORDER — POTASSIUM CHLORIDE 10 MEQ/100ML IV SOLN: 10.0000 meq | INTRAVENOUS | Status: DC

## 2018-09-02 MED ORDER — POTASSIUM CHLORIDE 20 MEQ PO PACK
20.0000 meq | PACK | Freq: Once | ORAL | Status: AC
  Administered 2018-09-02: 20 meq via NASOGASTRIC
  Filled 2018-09-02: qty 1

## 2018-09-02 NOTE — Progress Notes (Signed)
[facility]/Trauma Progress Note  3 Days Post-Op   Assessment/Plan DM HLD GERD Dysphagia  Dermatomyositis Extremity weakness Rash - General surgery asked to see for a muscle biopsy to confirm diagnosis of dermatomyositis which would allow for more aggressive treatment.  - OR tomorrow pending no emergent surgeries need to take precedence  - hold TF's at midnight  ID - none VTE - SCDs, lovenox FEN - IVF, NPO, TF Foley - none Follow up - TBD   LOS: 8 days    Subjective: CC: no complaints  No issues overnight. [employer] interpreter used  Objective: Vital signs in last 24 hours: Temp:  [97.9 F (36.6 C)-99.1 F (37.3 C)] 98.3 F (36.8 C) (08/18 0824) Pulse Rate:  [74-100] 92 (08/18 0824) Resp:  [16-24] 16 (08/18 0824) BP: (120-133)/(68-82) 120/72 (08/18 0824) SpO2:  [97 %-99 %] 98 % (08/18 0824) Last BM Date: 09/02/18  Intake/Output from previous day: No intake/output data recorded. Intake/Output this shift: Total I/O In: 2573.4 [I.V.:2573.4] Out: 400 [Urine:400]  PE: Gen:  Alert, NAD, pleasant, cooperative HEENT: head is normocephalic, atraumatic.  Sclera are noninjected.  Pupils equal and round.  Cortrak in place Pulm:  Rate and effort normal Skin: warm and dry, Rash noted to bilateral upper arms    Anti-infectives: Anti-infectives (From admission, onward)   None      Lab Results:  No results for input(s): WBC, HGB, HCT, PLT in the last 72 hours. BMET Recent Labs    09/01/18 1011 09/02/18 0515  NA 136 134*  K 4.0 3.3*  CL 104 101  CO2 23 24  GLUCOSE 152* 114*  BUN 8 12  CREATININE 0.44 0.40*  CALCIUM 7.9* 7.5*   PT/INR No results for input(s): LABPROT, INR in the last 72 hours. CMP     Component Value Date/Time   NA 134 (L) 09/02/2018 0515   K 3.3 (L) 09/02/2018 0515   CL 101 09/02/2018 0515   CO2 24 09/02/2018 0515   GLUCOSE 114 (H) 09/02/2018 0515   BUN 12 09/02/2018 0515   CREATININE 0.40 (L) 09/02/2018 0515    CREATININE 0.76 05/31/2015 0859   CALCIUM 7.5 (L) 09/02/2018 0515   PROT 4.8 (L) 09/02/2018 0515   ALBUMIN 2.1 (L) 09/02/2018 0515   AST 344 (H) 09/02/2018 0515   ALT 147 (H) 09/02/2018 0515   ALKPHOS 43 09/02/2018 0515   BILITOT 0.8 09/02/2018 0515   GFRNONAA >60 09/02/2018 0515   GFRNONAA 83 05/31/2015 0859   GFRAA >60 09/02/2018 0515   GFRAA >89 05/31/2015 0859   Lipase     Component Value Date/Time   LIPASE 26 12/12/2017 1132    Studies/Results: Mr Shoulder Left W Wo Contrast  Result Date: 09/01/2018 CLINICAL DATA:  Evaluate for by situs and site of possible biopsy. EXAM: MRI OF THE LEFT SHOULDER WITHOUT AND WITH CONTRAST TECHNIQUE: Multiplanar, multisequence MR imaging of the left shoulder shoulder was performed before and after the administration of intravenous contrast. CONTRAST:  7 mL Gadavist COMPARISON:  None. FINDINGS: Rotator cuff: Supraspinatus tendon is intact. Infraspinatus tendon is intact. Teres minor tendon is intact. Subscapularis tendon is intact. Muscles: No significant muscle atrophy. Severe muscle edema and enhancement of the supraspinatus, infraspinatus, subscapularis, teres minor, teres major, deltoid, pectoralis, and latissimus muscles. No intramuscular fluid collection. Biceps long head:  Intact. Acromioclavicular Joint: Normal acromioclavicular joint. Type II acromion. No subacromial/subdeltoid bursal fluid. Glenohumeral Joint: No joint effusion. No chondral defect. Labrum: Grossly intact, but evaluation is limited by lack of intraarticular  fluid. Bones:  No acute osseous abnormality.  No aggressive osseous lesion. Other: No fluid collection or hematoma. IMPRESSION: 1. Severe muscle edema and enhancement throughout the musculature around the left shoulder. Overall appearance is most concerning for myositis which may relate tube polymyositis or dermatomyositis or myositis related to connective tissue disorders. The deltoid muscle is involved and amenable to  percutaneous biopsy through a lateral approach. Electronically Signed   By: Kathreen Devoid   On: 09/01/2018 21:40   Mr Femur Right W Wo Contrast  Result Date: 09/01/2018 CLINICAL DATA:  Left shoulder and right femur pain. Evaluate for site of biopsy. EXAM: MRI OF THE RIGHT FEMUR WITHOUT AND WITH CONTRAST TECHNIQUE: Multiplanar, multisequence MR imaging of the right femur was performed both before and after administration of intravenous contrast. CONTRAST:  7 mL Gadavist COMPARISON:  None. FINDINGS: Bones/Joint/Cartilage No marrow signal abnormality. No fracture or dislocation. Normal alignment. No joint effusion. No aggressive osseous lesion. No periosteal reaction or bone destruction. 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 osteoarthritis of bilateral hips. Ligaments Collateral ligaments are intact. Muscles and Tendons Muscle edema and enhancement throughout the bilateral rectus femoris, vastus lateralis, vastus medialis and vastus intermedius muscles. Muscle edema is most severe in the right vastus lateralis muscle. Muscle edema and enhancement of the bilateral tensor fascia lata muscle. Muscle edema in the proximal half of the bilateral gracilis muscle. Muscle edema and enhancement of the bilateral obturator internus, obturator externus and adductor magnus. Mild muscle edema in the proximal most aspect of the semitendinosis muscle bilaterally. Quadriceps tendon and patellar tendon are intact. Edema in right Hoffa's fat. Soft tissue No fluid collection or hematoma.  No soft tissue mass. IMPRESSION: Myositis of bilateral thighs most severe in the quadriceps musculature. Overall appearance is most severe focally in the proximal right vastus lateralis muscle. This appearance can be seen with an inflammatory myositis such as polymyositis or dermatomyositis or myositis related to connective tissue disease. Electronically Signed   By: Kathreen Devoid   On: 09/01/2018 21:30   Dg Abd Portable  1v  Result Date: 09/01/2018 CLINICAL DATA:  Tube placement. EXAM: PORTABLE ABDOMEN - 1 VIEW COMPARISON:  Plain film of the abdomen dated 08/30/2018. FINDINGS: Enteric tube in the stomach with tip directed towards the stomach pylorus/proximal duodenum. Visualized bowel gas pattern is nonobstructive. IMPRESSION: Enteric tube in the stomach with tip directed towards the stomach pylorus/proximal duodenum. Electronically Signed   By: Franki Cabot M.D.   On: 09/01/2018 11:28      Kalman Drape , [facility] 09/02/2018, 10:40 AM  Pager: [phone] Mon-Wed, Friday 7:00am-4:30pm Thurs 7am-11:30am  Consults: [phone]

## 2018-09-02 NOTE — Progress Notes (Signed)
   Subjective:   Patient states she is doing okay this morning.  She notes she did have one episode of diarrhea overnight but denies melena or hematochezia.  Denies nausea, vomiting. She is tolerating her tube feeds without abdominal pain. Muscle weakness is stable without improvement or worsening.    Objective:  Vital signs in last 24 hours: Vitals:   09/01/18 2355 09/02/18 0430 09/02/18 0824 09/02/18 1202  BP: 132/73 127/68 120/72 133/72  Pulse: 100 98 92 93  Resp: (!) 24 (!) 24 16 16   Temp: 97.9 F (36.6 C) 98.2 F (36.8 C) 98.3 F (36.8 C) 99.4 F (37.4 C)  TempSrc: Oral Oral Oral Oral  SpO2: 98% 97% 98% 98%  Weight:      Height:       Physical Exam Constitutional:      General: She is not in acute distress.    Appearance: She is normal weight. She is not toxic-appearing.  Pulmonary:     Effort: Pulmonary effort is normal. No respiratory distress.  Abdominal:     General: Bowel sounds are normal.     Palpations: Abdomen is soft.  Skin:    General: Skin is warm and dry.  Neurological:     Mental Status: She is alert and oriented to person, place, and time. Mental status is at baseline.     Motor: Weakness (Stable muscle weakness from prior exams.  4/5 left upper extremity.) present.  Psychiatric:        Mood and Affect: Mood normal.        Behavior: Behavior normal.    Assessment/Plan:  Principal Problem:   Dysphagia Active Problems:   Secondary rhabdomyolysis   Adult type dermatomyositis (HCC)   Transaminitis   Elevated liver enzymes  #Dysphagia, Oropharyngeal #Dermatomyositis Lisa Mcdowell is admitted with complaints of dysphagia in light of recent dermatomyositis diagnosis and treatment with prednisone 60 mg, taken only for approximately 2 weeks before dysphasia onset. MBS showed oropharyngeal dysfunction without no obvious esophageal involvment.  Literature shows this can be due to the skeletal muscle dysfunction. EGD that did not show any esophageal  abnormalities.   There is promising evidence for IVIG treatment, however dermatomyositis diagnoses needs to be confirmed first.  Spoke with general surgery yesterday, who agrees to do a muscle biopsy tomorrow, pending no emergent surgeries take precedence.  MRI of the left upper extremity and right thigh were ordered and demonstrate very clearly which muscle groups are the most inflamed.  This should allow surgery to take a biopsy that will represent patient's pathology.  - Solumedrol 40mg  BID - NG Tube feeds - Muscle biopsy tomorrow  #Secondary Rhadomyolysis #Elevated LFTs CK is mildly elevated compared to yesterday at 5200.  Rheumatology has previously stated that CK will remain elevated for an extended period time before completely returning to normal.  Continue to trend.  - Trend LFTs - Trend CK - IV Fluids    Dispo: Anticipated discharge pending medical improvement  Dr. Jose Mcdowell Internal Medicine PGY-1  Pager: [phone] 09/02/2018, 1:00 PM

## 2018-09-03 LAB — COMPREHENSIVE METABOLIC PANEL
ALT: 150 U/L — ABNORMAL HIGH (ref 0–44)
AST: 327 U/L — ABNORMAL HIGH (ref 15–41)
Albumin: 2.2 g/dL — ABNORMAL LOW (ref 3.5–5.0)
Alkaline Phosphatase: 46 U/L (ref 38–126)
Anion gap: 8 (ref 5–15)
BUN: 8 mg/dL (ref 8–23)
CO2: 26 mmol/L (ref 22–32)
Calcium: 7.9 mg/dL — ABNORMAL LOW (ref 8.9–10.3)
Chloride: 103 mmol/L (ref 98–111)
Creatinine, Ser: 0.38 mg/dL — ABNORMAL LOW (ref 0.44–1.00)
GFR calc Af Amer: >60 mL/min (ref >60)
GFR calc non Af Amer: >60 mL/min (ref >60)
Glucose, Bld: 142 mg/dL — ABNORMAL HIGH (ref 70–99)
Potassium: 3.4 mmol/L — ABNORMAL LOW (ref 3.5–5.1)
Sodium: 137 mmol/L (ref 135–145)
Total Bilirubin: 0.7 mg/dL (ref 0.3–1.2)
Total Protein: 5 g/dL — ABNORMAL LOW (ref 6.5–8.1)

## 2018-09-03 LAB — GLUCOSE, CAPILLARY
Glucose-Capillary: 123 mg/dL — ABNORMAL HIGH (ref 70–99)
Glucose-Capillary: 128 mg/dL — ABNORMAL HIGH (ref 70–99)
Glucose-Capillary: 141 mg/dL — ABNORMAL HIGH (ref 70–99)
Glucose-Capillary: 143 mg/dL — ABNORMAL HIGH (ref 70–99)
Glucose-Capillary: 147 mg/dL — ABNORMAL HIGH (ref 70–99)
Glucose-Capillary: 150 mg/dL — ABNORMAL HIGH (ref 70–99)
Glucose-Capillary: 168 mg/dL — ABNORMAL HIGH (ref 70–99)

## 2018-09-03 LAB — MRSA PCR SCREENING: MRSA by PCR: NEGATIVE

## 2018-09-03 LAB — CK: Total CK: 4864 U/L — ABNORMAL HIGH (ref 38–234)

## 2018-09-03 NOTE — Progress Notes (Signed)
  Speech Language Pathology Treatment: Dysphagia  Patient Details Name: Lisa Mcdowell MRN: [id number] DOB: Jan 04, 1951 Today's Date: 09/03/2018 Time: 0850-0900 SLP Time Calculation (min) (ACUTE ONLY): 10 min  Assessment / Plan / Recommendation Clinical Impression  [employer] video interpreting service utilized during visit. No discernable improvement in swallow function.  NG present.  Pt continues to demonstrate visible effort in an attempt to initiate a swallow response with water/ice chips.  She eventually must expectorate bolus material. Continues to have difficulty managing secretions.  Continue NPO, NG feeds.  SLP will follow along for clinical improvements in swallow.   HPI HPI: This is a 68 year old female with a PMH significant for dermatomysitis which appears to be a fairly new diagnosis for her. Patient was recently hospitalized for 7/26-7/31 for dermatomyositis and was subsequently treated with IV steroids.  She is discharged with p.o. prednisone.  Patient presented to the ED with a 4 day h/o progressively worsening dysphagia characterized by globus.       SLP Plan  Continue with current plan of care       Recommendations  Diet recommendations: NPO Medication Administration: Via alternative means                Oral Care Recommendations: Oral care QID SLP Visit Diagnosis: Dysphagia, oropharyngeal phase (R13.12) Plan: Continue with current plan of care       GO                Juan Quam Laurice 09/03/2018, 9:34 AM  Estill Bamberg L. Tivis Ringer, [place] Office number [phone] Pager [phone]

## 2018-09-03 NOTE — Progress Notes (Signed)
   Subjective:   Patient has no acute complaints at this time.  She states her muscle weakness is still present but perhaps a bit better than yesterday.  She is looking forward to having her biopsy in order to confirm her diagnosis.  No questions or concerns at this time.  Objective:  Vital signs in last 24 hours: Vitals:   09/02/18 1616 09/02/18 1950 09/02/18 2342 09/03/18 0348  BP: 136/81 128/73 134/75 126/68  Pulse: 88 90 93 73  Resp:  16 14 15   Temp: 98.5 F (36.9 C) 98.1 F (36.7 C) 97.9 F (36.6 C) 98 F (36.7 C)  TempSrc: Oral Oral Oral Oral  SpO2: 99% 99% 99% 100%  Weight:      Height:       Physical Exam Constitutional:      General: She is not in acute distress.    Appearance: She is normal weight. She is not toxic-appearing.  Cardiovascular:     Rate and Rhythm: Normal rate and regular rhythm.     Heart sounds: No murmur. No gallop.   Pulmonary:     Effort: Pulmonary effort is normal. No respiratory distress.     Breath sounds: No wheezing or rales.  Skin:    General: Skin is warm and dry.  Neurological:     Mental Status: She is alert and oriented to person, place, and time. Mental status is at baseline.     Motor: Weakness (Stable muscle weakness from prior exams.  4/5 left upper extremity.) present.  Psychiatric:        Mood and Affect: Mood normal.        Behavior: Behavior normal.    Assessment/Plan:  Principal Problem:   Dysphagia Active Problems:   Secondary rhabdomyolysis   Adult type dermatomyositis (HCC)   Transaminitis   Elevated liver enzymes  #Dysphagia, Oropharyngeal #Dermatomyositis Lisa Mcdowell is admitted with complaints of dysphagia in light of recent dermatomyositis diagnosis and treatment with prednisone 60 mg, taken only for approximately 2 weeks before dysphasia onset. MBS showed oropharyngeal dysfunction without no obvious esophageal involvment.  Literature shows this can be due to the skeletal muscle dysfunction. EGD that did  not show any esophageal abnormalities.   Given promising evidence for IVIG treatment, will work on getting muscle biopsy as soon as possible. MRI of the left upper extremity and right thigh will allow surgery to tailor location of biopsy. Surgery has been delayed till tomorrow due to emergent surgeries occurring.   - Solumedrol 40mg  BID - NG Tube feeds - held at midnight for surgery - Muscle biopsy tomorrow (8/20)  #Secondary Rhadomyolysis #Elevated LFTs Rheumatology has previously stated that CK will remain elevated for an extended period time before completely returning to normal.  Today, LFTs and CK are improving compared to yesterday. Continue to trend.  - Trend LFTs - Trend CK - D5 @ 55mL/hr    Dispo: Anticipated discharge pending medical improvement  Dr. Jose Persia Internal Medicine PGY-1  Pager: [phone] 09/03/2018, 6:51 AM

## 2018-09-03 NOTE — Progress Notes (Signed)
[facility]/Trauma Progress Note  4 Days Post-Op   Assessment/Plan DM HLD GERD Dysphagia  Dermatomyositis Extremity weakness Rash - General surgery asked to see for a muscle biopsy to confirm diagnosis of dermatomyositis which would allow for more aggressive treatment.  - OR tomorrow pending no emergent surgeries need to take precedence  - hold TF's at midnight  ID -none VTE -SCDs, lovenox FEN -IVF, NPO, TF Foley -none Follow up -TBD  Plan: need to move pt's surgery til tomorrow (08/20) due to an emergent surgery. Hold TF's at midnight.    LOS: 9 days    Subjective: CC: weakness, arm pain  Pt feels about the same as she did yesterday. I discussed that we had to move her surgery to tomorrow. She understands. Son was at bedside. [employer] interpretor used.   Objective: Vital signs in last 24 hours: Temp:  [97.8 F (36.6 C)-99.4 F (37.4 C)] 97.8 F (36.6 C) (08/19 0740) Pulse Rate:  [73-93] 81 (08/19 0740) Resp:  [14-16] 16 (08/19 0740) BP: (126-136)/(68-81) 129/68 (08/19 0740) SpO2:  [97 %-100 %] 97 % (08/19 0740) Last BM Date: 09/02/18  Intake/Output from previous day: 08/18 0701 - 08/19 0700 In: 5330.4 [I.V.:4030.4; NG/GT:1300] Out: 2550 [Urine:2550] Intake/Output this shift: Total I/O In: -  Out: 600 [Urine:600]  PE: Gen:  Alert, NAD, pleasant, cooperative HEENT: head is normocephalic, atraumatic. Sclera are noninjected. Pupils equal and round. Cortrak in place, face appears flush Pulm:  Rate and effort normal Skin: warm and dry, Rash noted to bilateral upper arms   Anti-infectives: Anti-infectives (From admission, onward)   None      Lab Results:  No results for input(s): WBC, HGB, HCT, PLT in the last 72 hours. BMET Recent Labs    09/02/18 0515 09/03/18 0442  NA 134* 137  K 3.3* 3.4*  CL 101 103  CO2 24 26  GLUCOSE 114* 142*  BUN 12 8  CREATININE 0.40* 0.38*  CALCIUM 7.5* 7.9*   PT/INR No results for  input(s): LABPROT, INR in the last 72 hours. CMP     Component Value Date/Time   NA 137 09/03/2018 0442   K 3.4 (L) 09/03/2018 0442   CL 103 09/03/2018 0442   CO2 26 09/03/2018 0442   GLUCOSE 142 (H) 09/03/2018 0442   BUN 8 09/03/2018 0442   CREATININE 0.38 (L) 09/03/2018 0442   CREATININE 0.76 05/31/2015 0859   CALCIUM 7.9 (L) 09/03/2018 0442   PROT 5.0 (L) 09/03/2018 0442   ALBUMIN 2.2 (L) 09/03/2018 0442   AST 327 (H) 09/03/2018 0442   ALT 150 (H) 09/03/2018 0442   ALKPHOS 46 09/03/2018 0442   BILITOT 0.7 09/03/2018 0442   GFRNONAA >60 09/03/2018 0442   GFRNONAA 83 05/31/2015 0859   GFRAA >60 09/03/2018 0442   GFRAA >89 05/31/2015 0859   Lipase     Component Value Date/Time   LIPASE 26 12/12/2017 1132    Studies/Results: Mr Shoulder Left W Wo Contrast  Result Date: 09/01/2018 CLINICAL DATA:  Evaluate for by situs and site of possible biopsy. EXAM: MRI OF THE LEFT SHOULDER WITHOUT AND WITH CONTRAST TECHNIQUE: Multiplanar, multisequence MR imaging of the left shoulder shoulder was performed before and after the administration of intravenous contrast. CONTRAST:  7 mL Gadavist COMPARISON:  None. FINDINGS: Rotator cuff: Supraspinatus tendon is intact. Infraspinatus tendon is intact. Teres minor tendon is intact. Subscapularis tendon is intact. Muscles: No significant muscle atrophy. Severe muscle edema and enhancement of the supraspinatus, infraspinatus, subscapularis, teres minor, teres  major, deltoid, pectoralis, and latissimus muscles. No intramuscular fluid collection. Biceps long head:  Intact. Acromioclavicular Joint: Normal acromioclavicular joint. Type II acromion. No subacromial/subdeltoid bursal fluid. Glenohumeral Joint: No joint effusion. No chondral defect. Labrum: Grossly intact, but evaluation is limited by lack of intraarticular fluid. Bones:  No acute osseous abnormality.  No aggressive osseous lesion. Other: No fluid collection or hematoma. IMPRESSION: 1. Severe  muscle edema and enhancement throughout the musculature around the left shoulder. Overall appearance is most concerning for myositis which may relate tube polymyositis or dermatomyositis or myositis related to connective tissue disorders. The deltoid muscle is involved and amenable to percutaneous biopsy through a lateral approach. Electronically Signed   By: Kathreen Devoid   On: 09/01/2018 21:40   Mr Femur Right W Wo Contrast  Result Date: 09/01/2018 CLINICAL DATA:  Left shoulder and right femur pain. Evaluate for site of biopsy. EXAM: MRI OF THE RIGHT FEMUR WITHOUT AND WITH CONTRAST TECHNIQUE: Multiplanar, multisequence MR imaging of the right femur was performed both before and after administration of intravenous contrast. CONTRAST:  7 mL Gadavist COMPARISON:  None. FINDINGS: Bones/Joint/Cartilage No marrow signal abnormality. No fracture or dislocation. Normal alignment. No joint effusion. No aggressive osseous lesion. No periosteal reaction or bone destruction. 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 osteoarthritis of bilateral hips. Ligaments Collateral ligaments are intact. Muscles and Tendons Muscle edema and enhancement throughout the bilateral rectus femoris, vastus lateralis, vastus medialis and vastus intermedius muscles. Muscle edema is most severe in the right vastus lateralis muscle. Muscle edema and enhancement of the bilateral tensor fascia lata muscle. Muscle edema in the proximal half of the bilateral gracilis muscle. Muscle edema and enhancement of the bilateral obturator internus, obturator externus and adductor magnus. Mild muscle edema in the proximal most aspect of the semitendinosis muscle bilaterally. Quadriceps tendon and patellar tendon are intact. Edema in right Hoffa's fat. Soft tissue No fluid collection or hematoma.  No soft tissue mass. IMPRESSION: Myositis of bilateral thighs most severe in the quadriceps musculature. Overall appearance is most severe focally  in the proximal right vastus lateralis muscle. This appearance can be seen with an inflammatory myositis such as polymyositis or dermatomyositis or myositis related to connective tissue disease. Electronically Signed   By: Kathreen Devoid   On: 09/01/2018 21:30   Dg Abd Portable 1v  Result Date: 09/01/2018 CLINICAL DATA:  Tube placement. EXAM: PORTABLE ABDOMEN - 1 VIEW COMPARISON:  Plain film of the abdomen dated 08/30/2018. FINDINGS: Enteric tube in the stomach with tip directed towards the stomach pylorus/proximal duodenum. Visualized bowel gas pattern is nonobstructive. IMPRESSION: Enteric tube in the stomach with tip directed towards the stomach pylorus/proximal duodenum. Electronically Signed   By: Franki Cabot M.D.   On: 09/01/2018 11:28      Kalman Drape , [facility] Surgery 09/03/2018, 10:16 AM  Pager: [phone] Mon-Wed, Friday 7:00am-4:30pm Thurs 7am-11:30am  Consults: [phone]

## 2018-09-03 NOTE — H&P (View-Only) (Signed)
[facility]/Trauma Progress Note  4 Days Post-Op   Assessment/Plan DM HLD GERD Dysphagia  Dermatomyositis Extremity weakness Rash - General surgery asked to see for a muscle biopsy to confirm diagnosis of dermatomyositis which would allow for more aggressive treatment.  - OR tomorrow pending no emergent surgeries need to take precedence  - hold TF's at midnight  ID -none VTE -SCDs, lovenox FEN -IVF, NPO, TF Foley -none Follow up -TBD  Plan: need to move pt's surgery til tomorrow (08/20) due to an emergent surgery. Hold TF's at midnight.    LOS: 9 days    Subjective: CC: weakness, arm pain  Pt feels about the same as she did yesterday. I discussed that we had to move her surgery to tomorrow. She understands. Son was at bedside. [employer] interpretor used.   Objective: Vital signs in last 24 hours: Temp:  [97.8 F (36.6 C)-99.4 F (37.4 C)] 97.8 F (36.6 C) (08/19 0740) Pulse Rate:  [73-93] 81 (08/19 0740) Resp:  [14-16] 16 (08/19 0740) BP: (126-136)/(68-81) 129/68 (08/19 0740) SpO2:  [97 %-100 %] 97 % (08/19 0740) Last BM Date: 09/02/18  Intake/Output from previous day: 08/18 0701 - 08/19 0700 In: 5330.4 [I.V.:4030.4; NG/GT:1300] Out: 2550 [Urine:2550] Intake/Output this shift: Total I/O In: -  Out: 600 [Urine:600]  PE: Gen:  Alert, NAD, pleasant, cooperative HEENT: head is normocephalic, atraumatic. Sclera are noninjected. Pupils equal and round. Cortrak in place, face appears flush Pulm:  Rate and effort normal Skin: warm and dry, Rash noted to bilateral upper arms   Anti-infectives: Anti-infectives (From admission, onward)   None      Lab Results:  No results for input(s): WBC, HGB, HCT, PLT in the last 72 hours. BMET Recent Labs    09/02/18 0515 09/03/18 0442  NA 134* 137  K 3.3* 3.4*  CL 101 103  CO2 24 26  GLUCOSE 114* 142*  BUN 12 8  CREATININE 0.40* 0.38*  CALCIUM 7.5* 7.9*   PT/INR No results for  input(s): LABPROT, INR in the last 72 hours. CMP     Component Value Date/Time   NA 137 09/03/2018 0442   K 3.4 (L) 09/03/2018 0442   CL 103 09/03/2018 0442   CO2 26 09/03/2018 0442   GLUCOSE 142 (H) 09/03/2018 0442   BUN 8 09/03/2018 0442   CREATININE 0.38 (L) 09/03/2018 0442   CREATININE 0.76 05/31/2015 0859   CALCIUM 7.9 (L) 09/03/2018 0442   PROT 5.0 (L) 09/03/2018 0442   ALBUMIN 2.2 (L) 09/03/2018 0442   AST 327 (H) 09/03/2018 0442   ALT 150 (H) 09/03/2018 0442   ALKPHOS 46 09/03/2018 0442   BILITOT 0.7 09/03/2018 0442   GFRNONAA >60 09/03/2018 0442   GFRNONAA 83 05/31/2015 0859   GFRAA >60 09/03/2018 0442   GFRAA >89 05/31/2015 0859   Lipase     Component Value Date/Time   LIPASE 26 12/12/2017 1132    Studies/Results: Mr Shoulder Left W Wo Contrast  Result Date: 09/01/2018 CLINICAL DATA:  Evaluate for by situs and site of possible biopsy. EXAM: MRI OF THE LEFT SHOULDER WITHOUT AND WITH CONTRAST TECHNIQUE: Multiplanar, multisequence MR imaging of the left shoulder shoulder was performed before and after the administration of intravenous contrast. CONTRAST:  7 mL Gadavist COMPARISON:  None. FINDINGS: Rotator cuff: Supraspinatus tendon is intact. Infraspinatus tendon is intact. Teres minor tendon is intact. Subscapularis tendon is intact. Muscles: No significant muscle atrophy. Severe muscle edema and enhancement of the supraspinatus, infraspinatus, subscapularis, teres minor, teres  major, deltoid, pectoralis, and latissimus muscles. No intramuscular fluid collection. Biceps long head:  Intact. Acromioclavicular Joint: Normal acromioclavicular joint. Type II acromion. No subacromial/subdeltoid bursal fluid. Glenohumeral Joint: No joint effusion. No chondral defect. Labrum: Grossly intact, but evaluation is limited by lack of intraarticular fluid. Bones:  No acute osseous abnormality.  No aggressive osseous lesion. Other: No fluid collection or hematoma. IMPRESSION: 1. Severe  muscle edema and enhancement throughout the musculature around the left shoulder. Overall appearance is most concerning for myositis which may relate tube polymyositis or dermatomyositis or myositis related to connective tissue disorders. The deltoid muscle is involved and amenable to percutaneous biopsy through a lateral approach. Electronically Signed   By: Kathreen Devoid   On: 09/01/2018 21:40   Mr Femur Right W Wo Contrast  Result Date: 09/01/2018 CLINICAL DATA:  Left shoulder and right femur pain. Evaluate for site of biopsy. EXAM: MRI OF THE RIGHT FEMUR WITHOUT AND WITH CONTRAST TECHNIQUE: Multiplanar, multisequence MR imaging of the right femur was performed both before and after administration of intravenous contrast. CONTRAST:  7 mL Gadavist COMPARISON:  None. FINDINGS: Bones/Joint/Cartilage No marrow signal abnormality. No fracture or dislocation. Normal alignment. No joint effusion. No aggressive osseous lesion. No periosteal reaction or bone destruction. 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 osteoarthritis of bilateral hips. Ligaments Collateral ligaments are intact. Muscles and Tendons Muscle edema and enhancement throughout the bilateral rectus femoris, vastus lateralis, vastus medialis and vastus intermedius muscles. Muscle edema is most severe in the right vastus lateralis muscle. Muscle edema and enhancement of the bilateral tensor fascia lata muscle. Muscle edema in the proximal half of the bilateral gracilis muscle. Muscle edema and enhancement of the bilateral obturator internus, obturator externus and adductor magnus. Mild muscle edema in the proximal most aspect of the semitendinosis muscle bilaterally. Quadriceps tendon and patellar tendon are intact. Edema in right Hoffa's fat. Soft tissue No fluid collection or hematoma.  No soft tissue mass. IMPRESSION: Myositis of bilateral thighs most severe in the quadriceps musculature. Overall appearance is most severe focally  in the proximal right vastus lateralis muscle. This appearance can be seen with an inflammatory myositis such as polymyositis or dermatomyositis or myositis related to connective tissue disease. Electronically Signed   By: Kathreen Devoid   On: 09/01/2018 21:30   Dg Abd Portable 1v  Result Date: 09/01/2018 CLINICAL DATA:  Tube placement. EXAM: PORTABLE ABDOMEN - 1 VIEW COMPARISON:  Plain film of the abdomen dated 08/30/2018. FINDINGS: Enteric tube in the stomach with tip directed towards the stomach pylorus/proximal duodenum. Visualized bowel gas pattern is nonobstructive. IMPRESSION: Enteric tube in the stomach with tip directed towards the stomach pylorus/proximal duodenum. Electronically Signed   By: Franki Cabot M.D.   On: 09/01/2018 11:28      Kalman Drape , [facility] 09/03/2018, 10:16 AM  Pager: [phone] Mon-Wed, Friday 7:00am-4:30pm Thurs 7am-11:30am  Consults: [phone]

## 2018-09-04 ENCOUNTER — Inpatient Hospital Stay (HOSPITAL_COMMUNITY): Payer: Medicaid Other | Admitting: Certified Registered Nurse Anesthetist

## 2018-09-04 ENCOUNTER — Encounter (HOSPITAL_COMMUNITY)
Admission: EM | Disposition: A | Discharge status: Rehabilitation Facility | Payer: Self-pay | Source: Home / Self Care | Attending: Internal Medicine

## 2018-09-04 ENCOUNTER — Encounter (HOSPITAL_COMMUNITY): Payer: Self-pay | Admitting: Orthopedic Surgery

## 2018-09-04 HISTORY — PX: MUSCLE BIOPSY: SHX716

## 2018-09-04 LAB — COMPREHENSIVE METABOLIC PANEL
ALT: 157 U/L — ABNORMAL HIGH (ref 0–44)
AST: 328 U/L — ABNORMAL HIGH (ref 15–41)
Albumin: 2.2 g/dL — ABNORMAL LOW (ref 3.5–5.0)
Alkaline Phosphatase: 45 U/L (ref 38–126)
Anion gap: 9 (ref 5–15)
BUN: 9 mg/dL (ref 8–23)
CO2: 27 mmol/L (ref 22–32)
Calcium: 8.1 mg/dL — ABNORMAL LOW (ref 8.9–10.3)
Chloride: 101 mmol/L (ref 98–111)
Creatinine, Ser: 0.37 mg/dL — ABNORMAL LOW (ref 0.44–1.00)
GFR calc Af Amer: >60 mL/min (ref >60)
GFR calc non Af Amer: >60 mL/min (ref >60)
Glucose, Bld: 148 mg/dL — ABNORMAL HIGH (ref 70–99)
Potassium: 3.8 mmol/L (ref 3.5–5.1)
Sodium: 137 mmol/L (ref 135–145)
Total Bilirubin: 0.8 mg/dL (ref 0.3–1.2)
Total Protein: 5.1 g/dL — ABNORMAL LOW (ref 6.5–8.1)

## 2018-09-04 LAB — GLUCOSE, CAPILLARY
Glucose-Capillary: 135 mg/dL — ABNORMAL HIGH (ref 70–99)
Glucose-Capillary: 127 mg/dL — ABNORMAL HIGH (ref 70–99)
Glucose-Capillary: 123 mg/dL — ABNORMAL HIGH (ref 70–99)
Glucose-Capillary: 105 mg/dL — ABNORMAL HIGH (ref 70–99)
Glucose-Capillary: 77 mg/dL (ref 70–99)
Glucose-Capillary: 111 mg/dL — ABNORMAL HIGH (ref 70–99)

## 2018-09-04 LAB — CK: Total CK: 4874 U/L — ABNORMAL HIGH (ref 38–234)

## 2018-09-04 SURGERY — MUSCLE BIOPSY
Anesthesia: General | Site: Thigh | Laterality: Right

## 2018-09-04 MED ORDER — FENTANYL CITRATE (PF) 250 MCG/5ML IJ SOLN
INTRAMUSCULAR | Status: AC
  Filled 2018-09-04: qty 5

## 2018-09-04 MED ORDER — SUGAMMADEX SODIUM 200 MG/2ML IV SOLN
INTRAVENOUS | Status: DC | PRN
  Administered 2018-09-04: 160 mg via INTRAVENOUS

## 2018-09-04 MED ORDER — 0.9 % SODIUM CHLORIDE (POUR BTL) OPTIME
TOPICAL | Status: DC | PRN
  Administered 2018-09-04: 16:00:00 1000 mL

## 2018-09-04 MED ORDER — ONDANSETRON HCL 4 MG/2ML IJ SOLN
INTRAMUSCULAR | Status: AC
  Filled 2018-09-04: qty 2

## 2018-09-04 MED ORDER — MIDAZOLAM HCL 2 MG/2ML IJ SOLN
INTRAMUSCULAR | Status: AC
  Filled 2018-09-04: qty 2

## 2018-09-04 MED ORDER — ROCURONIUM BROMIDE 10 MG/ML (PF) SYRINGE
PREFILLED_SYRINGE | INTRAVENOUS | Status: AC
  Filled 2018-09-04: qty 10

## 2018-09-04 MED ORDER — DEXAMETHASONE SODIUM PHOSPHATE 10 MG/ML IJ SOLN
INTRAMUSCULAR | Status: AC
  Filled 2018-09-04: qty 1

## 2018-09-04 MED ORDER — MIDAZOLAM HCL 5 MG/5ML IJ SOLN
INTRAMUSCULAR | Status: DC | PRN
  Administered 2018-09-04: 1 mg via INTRAVENOUS

## 2018-09-04 MED ORDER — LACTATED RINGERS IV SOLN
INTRAVENOUS | Status: DC
  Administered 2018-09-04: 15:00:00 1000 mL via INTRAVENOUS

## 2018-09-04 MED ORDER — CEFAZOLIN SODIUM 1 G IJ SOLR
INTRAMUSCULAR | Status: AC
  Filled 2018-09-04: qty 20

## 2018-09-04 MED ORDER — ROCURONIUM BROMIDE 10 MG/ML (PF) SYRINGE
PREFILLED_SYRINGE | INTRAVENOUS | Status: DC | PRN
  Administered 2018-09-04: 50 mg via INTRAVENOUS

## 2018-09-04 MED ORDER — PROPOFOL 10 MG/ML IV BOLUS
INTRAVENOUS | Status: AC
  Filled 2018-09-04: qty 20

## 2018-09-04 MED ORDER — FENTANYL CITRATE (PF) 250 MCG/5ML IJ SOLN
INTRAMUSCULAR | Status: DC | PRN
  Administered 2018-09-04 (×2): 50 ug via INTRAVENOUS

## 2018-09-04 MED ORDER — PROPOFOL 10 MG/ML IV BOLUS
INTRAVENOUS | Status: DC | PRN
  Administered 2018-09-04: 100 mg via INTRAVENOUS

## 2018-09-04 MED ORDER — DEXAMETHASONE SODIUM PHOSPHATE 10 MG/ML IJ SOLN
INTRAMUSCULAR | Status: DC | PRN
  Administered 2018-09-04: 5 mg via INTRAVENOUS

## 2018-09-04 MED ORDER — PHENOL 1.4 % MT LIQD
1.0000 | OROMUCOSAL | Status: DC | PRN
  Filled 2018-09-04: qty 177

## 2018-09-04 MED ORDER — BUPIVACAINE-EPINEPHRINE (PF) 0.25% -1:200000 IJ SOLN
INTRAMUSCULAR | Status: AC
  Filled 2018-09-04: qty 30

## 2018-09-04 MED ORDER — CEFAZOLIN SODIUM-DEXTROSE 2-3 GM-%(50ML) IV SOLR
INTRAVENOUS | Status: DC | PRN
  Administered 2018-09-04: 2 g via INTRAVENOUS

## 2018-09-04 MED ORDER — BUPIVACAINE-EPINEPHRINE (PF) 0.25% -1:200000 IJ SOLN
INTRAMUSCULAR | Status: DC | PRN
  Administered 2018-09-04: 7 mL

## 2018-09-04 MED ORDER — ONDANSETRON HCL 4 MG/2ML IJ SOLN
INTRAMUSCULAR | Status: DC | PRN
  Administered 2018-09-04: 4 mg via INTRAVENOUS

## 2018-09-04 SURGICAL SUPPLY — 35 items
CANISTER SUCT 3000ML PPV (MISCELLANEOUS) IMPLANT
CHLORAPREP W/TINT 10.5 ML (MISCELLANEOUS) ×3 IMPLANT
CONT SPEC 4OZ CLIKSEAL STRL BL (MISCELLANEOUS) ×3 IMPLANT
COVER SURGICAL LIGHT HANDLE (MISCELLANEOUS) ×3 IMPLANT
COVER WAND RF STERILE (DRAPES) ×3 IMPLANT
DERMABOND ADVANCED (GAUZE/BANDAGES/DRESSINGS) ×2
DERMABOND ADVANCED .7 DNX12 (GAUZE/BANDAGES/DRESSINGS) ×1 IMPLANT
DRAPE LAPAROTOMY 100X72 PEDS (DRAPES) ×3 IMPLANT
DRSG TELFA 3X8 NADH (GAUZE/BANDAGES/DRESSINGS) ×3 IMPLANT
ELECT REM PT RETURN 9FT ADLT (ELECTROSURGICAL) ×3
ELECTRODE REM PT RTRN 9FT ADLT (ELECTROSURGICAL) ×1 IMPLANT
GAUZE 4X4 16PLY RFD (DISPOSABLE) ×3 IMPLANT
GLOVE BIO SURGEON STRL SZ8 (GLOVE) ×3 IMPLANT
GLOVE BIOGEL PI IND STRL 8 (GLOVE) ×1 IMPLANT
GLOVE BIOGEL PI INDICATOR 8 (GLOVE) ×2
GOWN STRL REUS W/ TWL LRG LVL3 (GOWN DISPOSABLE) ×2 IMPLANT
GOWN STRL REUS W/ TWL XL LVL3 (GOWN DISPOSABLE) ×1 IMPLANT
GOWN STRL REUS W/TWL LRG LVL3 (GOWN DISPOSABLE) ×4
GOWN STRL REUS W/TWL XL LVL3 (GOWN DISPOSABLE) ×2
KIT BASIN OR (CUSTOM PROCEDURE TRAY) ×3 IMPLANT
KIT TURNOVER KIT B (KITS) ×3 IMPLANT
NDL HYPO 25GX1X1/2 BEV (NEEDLE) ×1 IMPLANT
NEEDLE HYPO 25GX1X1/2 BEV (NEEDLE) ×3 IMPLANT
NS IRRIG 1000ML POUR BTL (IV SOLUTION) ×3 IMPLANT
PACK GENERAL/GYN (CUSTOM PROCEDURE TRAY) ×3 IMPLANT
PAD ARMBOARD 7.5X6 YLW CONV (MISCELLANEOUS) ×6 IMPLANT
PAD DRESSING TELFA 3X8 NADH (GAUZE/BANDAGES/DRESSINGS) ×1 IMPLANT
PENCIL SMOKE EVACUATOR (MISCELLANEOUS) ×3 IMPLANT
SUT MON AB 4-0 PC3 18 (SUTURE) ×3 IMPLANT
SUT SILK 2 0 (SUTURE) ×2
SUT SILK 2-0 18XBRD TIE 12 (SUTURE) ×1 IMPLANT
SUT VIC AB 3-0 SH 18 (SUTURE) ×3 IMPLANT
SYR CONTROL 10ML LL (SYRINGE) ×3 IMPLANT
TOWEL GREEN STERILE (TOWEL DISPOSABLE) ×3 IMPLANT
TOWEL GREEN STERILE FF (TOWEL DISPOSABLE) ×3 IMPLANT

## 2018-09-04 NOTE — Anesthesia Postprocedure Evaluation (Signed)
Anesthesia Post Note  Patient: Lisa Mcdowell  Procedure(s) Performed: RIGHT THIGH MUSCLE BIOPSY (Right Thigh)     Patient location during evaluation: PACU Anesthesia Type: General Level of consciousness: awake and alert Pain management: pain level controlled Vital Signs Assessment: post-procedure vital signs reviewed and stable Respiratory status: spontaneous breathing, nonlabored ventilation and respiratory function stable Cardiovascular status: blood pressure returned to baseline and stable Postop Assessment: no apparent nausea or vomiting Anesthetic complications: no    Last Vitals:  Vitals:   09/04/18 1642 09/04/18 1654  BP: 126/69 134/68  Pulse: 82 75  Resp: 16 18  Temp: 36.9 C 36.7 C  SpO2: 93% 99%    Last Pain:  Vitals:   09/04/18 1642  TempSrc:   PainSc: 0-No pain                 Heavan Francom,W. EDMOND

## 2018-09-04 NOTE — Anesthesia Preprocedure Evaluation (Signed)
Anesthesia Evaluation  Patient identified by MRN, date of birth, ID band Patient awake    Reviewed: Allergy & Precautions, NPO status , Patient's Chart, lab work & pertinent test results  Airway Mallampati: II  TM Distance: >3 FB Neck ROM: Full    Dental  (+) Dental Advisory Given, Edentulous Upper, Edentulous Lower   Pulmonary    breath sounds clear to auscultation       Cardiovascular negative cardio ROS   Rhythm:Regular Rate:Normal     Neuro/Psych  Neuromuscular disease    GI/Hepatic Neg liver ROS, GERD  Medicated,  Endo/Other  negative endocrine ROS  Renal/GU negative Renal ROS     Musculoskeletal negative musculoskeletal ROS (+)   Abdominal Normal abdominal exam  (+)   Peds  Hematology negative hematology ROS (+)   Anesthesia Other Findings   Reproductive/Obstetrics                             Anesthesia Physical Anesthesia Plan  ASA: II  Anesthesia Plan: General   Post-op Pain Management:    Induction: Intravenous  PONV Risk Score and Plan:   Airway Management Planned: Oral ETT  Additional Equipment: None  Intra-op Plan:   Post-operative Plan: Extubation in OR  Informed Consent: I have reviewed the patients History and Physical, chart, labs and discussed the procedure including the risks, benefits and alternatives for the proposed anesthesia with the patient or authorized representative who has indicated his/her understanding and acceptance.       Plan Discussed with: CRNA  Anesthesia Plan Comments:         Anesthesia Quick Evaluation

## 2018-09-04 NOTE — Progress Notes (Addendum)
   Subjective:   Patient has no acute complaints at this time. She feels her arms are heavy due to edema. Otherwise, she is tolerating her tube feeds well but is excited to eat again. No acute complaints at this time.   Objective:  Vital signs in last 24 hours: Vitals:   09/03/18 1550 09/03/18 1959 09/03/18 2332 09/04/18 0359  BP: 126/83 131/63 121/69 127/65  Pulse: 89 79 78 73  Resp: 16 18 17 16   Temp: 98.1 F (36.7 C) 98.9 F (37.2 C) 98 F (36.7 C) 98 F (36.7 C)  TempSrc: Oral Oral Oral Oral  SpO2: 99% 100% 99% 97%  Weight:      Height:       Physical Exam Constitutional:      General: She is not in acute distress.    Appearance: She is normal weight. She is not toxic-appearing.  Musculoskeletal:        General: Swelling (LUE and RUE with forearm. ) present.  Skin:    General: Skin is warm and dry.  Neurological:     Mental Status: She is alert and oriented to person, place, and time. Mental status is at baseline.     Motor: Weakness (Stable muscle weakness from prior exams.  4/5 left upper extremity.) present.  Psychiatric:        Mood and Affect: Mood normal.        Behavior: Behavior normal.    Assessment/Plan:  Principal Problem:   Dysphagia Active Problems:   Secondary rhabdomyolysis   Adult type dermatomyositis (HCC)   Transaminitis   Elevated liver enzymes  #Dysphagia, Oropharyngeal #Dermatomyositis Ms. Delage is admitted with complaints of dysphagia in light of recent dermatomyositis diagnosis and treatment with prednisone 60 mg, taken only for approximately 2 weeks before dysphasia onset. MBS showed oropharyngeal dysfunction without no obvious esophageal involvment.  Literature shows this can be due to the skeletal muscle dysfunction. EGD that did not show any esophageal abnormalities.   Given promising evidence for IVIG treatment, will work on getting muscle biopsy as soon as possible. Surgery today per chart review is scheduled for 3:45.   -  Solumedrol 40mg  BID - NG Tube feeds - held at midnight for surgery - Muscle biopsy today at 3:45 per surgery schedule (8/20)   #Secondary Rhadomyolysis #Elevated LFTs Rheumatology has previously stated that CK will remain elevated for an extended period time before completely returning to normal.  Today, LFTs and CK are staying stable with slight fluctuations. Continue to trend.  - Trend LFTs - Trend CK -discontinue fluids, intake fine from NGT.  Dispo: Anticipated discharge pending medical improvement  Dr. Jose Persia Internal Medicine PGY-1  Pager: [phone] 09/04/2018, 6:41 AM

## 2018-09-04 NOTE — Anesthesia Procedure Notes (Signed)
Procedure Name: Intubation Date/Time: 09/04/2018 3:37 PM Performed by: Imagene Riches, CRNA Pre-anesthesia Checklist: Patient identified, Emergency Drugs available, Suction available and Patient being monitored Patient Re-evaluated:Patient Re-evaluated prior to induction Oxygen Delivery Method: Circle System Utilized Preoxygenation: Pre-oxygenation with 100% oxygen Induction Type: IV induction Ventilation: Mask ventilation without difficulty Laryngoscope Size: Miller and 2 Grade View: Grade I Tube type: Oral Tube size: 7.0 mm Number of attempts: 1 Airway Equipment and Method: Stylet and Oral airway Placement Confirmation: ETT inserted through vocal cords under direct vision,  positive ETCO2 and breath sounds checked- equal and bilateral Tube secured with: Tape Dental Injury: Teeth and Oropharynx as per pre-operative assessment

## 2018-09-04 NOTE — Interval H&P Note (Signed)
History and Physical Interval Note:  09/04/2018 3:30 PM  Lisa Mcdowell  has presented today for surgery, with the diagnosis of DERMATOMYOSITIS.  The various methods of treatment have been discussed with the patient and family. After consideration of risks, benefits and other options for treatment, the patient has consented to  Procedure(s): RIGHT THIGH MUSCLE BIOPSY (Right) as a surgical intervention.  The patient's history has been reviewed, patient examined, no change in status, stable for surgery.  I have reviewed the patient's chart and labs.  Questions were answered to the patient's satisfaction.     [place]

## 2018-09-04 NOTE — Transfer of Care (Signed)
Immediate Anesthesia Transfer of Care Note  Patient: Lisa Mcdowell  Procedure(s) Performed: RIGHT THIGH MUSCLE BIOPSY (Right Thigh)  Patient Location: PACU  Anesthesia Type:General  Level of Consciousness: awake and alert   Airway & Oxygen Therapy: Patient Spontanous Breathing  Post-op Assessment: Report given to RN and Post -op Vital signs reviewed and stable  Post vital signs: Reviewed and stable  Last Vitals:  Vitals Value Taken Time  BP 134/77 09/04/18 1615  Temp 37 C 09/04/18 1615  Pulse 93 09/04/18 1615  Resp 18 09/04/18 1617  SpO2 97 % 09/04/18 1615  Vitals shown include unvalidated device data.  Last Pain:  Vitals:   09/04/18 1139  TempSrc: Oral  PainSc:          Complications: No apparent anesthesia complications

## 2018-09-04 NOTE — Progress Notes (Signed)
Pt NPO for Muscle Biobsy this am

## 2018-09-04 NOTE — Op Note (Signed)
Preoperative diagnosis: Right thigh weakness  Postop diagnosis: Same  Procedure: Right thigh muscle biopsy of the vastus lateralis complex  Surgeon: Josephina Gip, MD  Anesthesia General with 0.25 per Sensorcaine local with epinephrine  EBL: Minimal  Specimen 3 cm length of muscle pathology  Drains: None  Indications for procedure the patient is a 68 year old female admitted with overall muscle weakness.  A muscle biopsy was requested further diagnostic information.  Risk and benefits were discussed.  Interpreter used to discuss the procedure since she only speak Spanish and she voiced no concerns.The procedure was discussed with the patient.  The procedure has been discussed with the patient.  Alternative therapies have been discussed with the patient.  Operative risks include bleeding,  Infection,  Organ injury,  Nerve injury,  Blood vessel injury,  DVT,  Pulmonary embolism,  Death,  And possible reoperation.  Medical management risks include worsening of present situation.  The success of the procedure is 50 -90 % at treating patients symptoms.  The patient understands and agrees to proceed.   Description of procedure: The patient was met in the holding area.  Questions were answered with the help of a translator.  Right thigh was marked as the correct side.  Patient taken back to the operating.  She is placed supine upon the operating room table.  After induction of general esthesia right thigh was prepped and draped in a sterile fashion timeout was performed.  She received 2 g of Ancef.  A 2 cm incision was made over the anterior thigh.  Dissection was carried down until the aponeurosis of the vastus lateralis was encountered.  This was opened.  A 3 cm segment of muscle was removed.  Hemostasis achieved.  The fascial sheath was closed with 2-0 Vicryl.  Subcutaneous fat was closed with 2-0 Vicryl and 4 Monocryl is used to close skin in a subcuticular fashion.  All counts correct.  Dermabond  applied.  Patient was awoke extubated taken recovery in satisfactory condition.

## 2018-09-04 NOTE — Progress Notes (Addendum)
Nutrition Follow-up   RD working remotely.  DOCUMENTATION CODES:   Not applicable  INTERVENTION:  Once able to resume TF, continue Jevity 1.2 formula via Cortrak NGT at goal rate of 55 ml/hr.  30 ml Prostat BID.    Free water flushes per MD of 400 ml every 8 hours. (MD to adjust as appropriate)  Tube feeding regimen provides 1784 kcal (100% of needs), 103 grams of protein, and 2269 ml of H2O.   NUTRITION DIAGNOSIS:   Inadequate oral intake related to dysphagia as evidenced by NPO status; ongoing  GOAL:   Patient will meet greater than or equal to 90% of their needs; met with TF  MONITOR:   TF tolerance, Diet advancement, Weight trends, Labs, I & O's, Skin  REASON FOR ASSESSMENT:   Consult Enteral/tube feeding initiation and management  ASSESSMENT:   68 year old female with recently diagnosed dermatomyositis, now with dysphagia to solids and liquids. Cortrak NGT replaced 8/17.  Tip of tube in stomach.  Plan for muscle biopsy today. TF on hold. Pt has been tolerating her tube feeds well. Recommend continuation of feeding regimen once able to restart nutrition. RD to continue to monitor for tolerance.   Labs and medications reviewed.   Diet Order:   Diet Order            Diet NPO time specified  Diet effective now              EDUCATION NEEDS:   Not appropriate for education at this time  Skin:  Skin Assessment: Reviewed RN Assessment  Last BM:  8/18  Height:   Ht Readings from Last 1 Encounters:  08/30/18 5' 6" (1.676 m)    Weight:   Wt Readings from Last 1 Encounters:  09/01/18 78 kg    Ideal Body Weight:  59 kg  BMI:  Body mass index is 27.75 kg/m.  Estimated Nutritional Needs:   Kcal:  0172-0910  Protein:  85-100 grams  Fluid:  1.7 - 1.9 L/day    Corrin Parker, MS, RD, LDN Pager # [phone] After hours/ weekend pager # [phone]

## 2018-09-05 ENCOUNTER — Encounter (HOSPITAL_COMMUNITY): Payer: Self-pay | Admitting: Surgery

## 2018-09-05 LAB — GLUCOSE, CAPILLARY
Glucose-Capillary: 134 mg/dL — ABNORMAL HIGH (ref 70–99)
Glucose-Capillary: 142 mg/dL — ABNORMAL HIGH (ref 70–99)
Glucose-Capillary: 155 mg/dL — ABNORMAL HIGH (ref 70–99)
Glucose-Capillary: 169 mg/dL — ABNORMAL HIGH (ref 70–99)
Glucose-Capillary: 55 mg/dL — ABNORMAL LOW (ref 70–99)
Glucose-Capillary: 71 mg/dL (ref 70–99)
Glucose-Capillary: 75 mg/dL (ref 70–99)
Glucose-Capillary: 77 mg/dL (ref 70–99)
Glucose-Capillary: 97 mg/dL (ref 70–99)

## 2018-09-05 LAB — COMPREHENSIVE METABOLIC PANEL
ALT: 152 U/L — ABNORMAL HIGH (ref 0–44)
AST: 335 U/L — ABNORMAL HIGH (ref 15–41)
Albumin: 2.2 g/dL — ABNORMAL LOW (ref 3.5–5.0)
Alkaline Phosphatase: 48 U/L (ref 38–126)
Anion gap: 9 (ref 5–15)
BUN: 12 mg/dL (ref 8–23)
CO2: 27 mmol/L (ref 22–32)
Calcium: 8 mg/dL — ABNORMAL LOW (ref 8.9–10.3)
Chloride: 101 mmol/L (ref 98–111)
Creatinine, Ser: 0.39 mg/dL — ABNORMAL LOW (ref 0.44–1.00)
GFR calc Af Amer: >60 mL/min (ref >60)
GFR calc non Af Amer: >60 mL/min (ref >60)
Glucose, Bld: 107 mg/dL — ABNORMAL HIGH (ref 70–99)
Potassium: 3.9 mmol/L (ref 3.5–5.1)
Sodium: 137 mmol/L (ref 135–145)
Total Bilirubin: 0.7 mg/dL (ref 0.3–1.2)
Total Protein: 5 g/dL — ABNORMAL LOW (ref 6.5–8.1)

## 2018-09-05 LAB — CK: Total CK: 4910 U/L — ABNORMAL HIGH (ref 38–234)

## 2018-09-05 MED ORDER — IMMUNE GLOBULIN (HUMAN) 20 GM/200ML IV SOLN
1.0000 g/kg | INTRAVENOUS | Status: DC
  Administered 2018-09-05: 80 g via INTRAVENOUS
  Filled 2018-09-05 (×2): qty 800

## 2018-09-05 MED ORDER — ACETAMINOPHEN 160 MG/5ML PO SOLN
500.0000 mg | Freq: Four times a day (QID) | ORAL | Status: DC | PRN
  Administered 2018-09-08 – 2018-09-17 (×7): 500 mg via ORAL
  Filled 2018-09-05 (×7): qty 20.3

## 2018-09-05 MED ORDER — ENOXAPARIN SODIUM 40 MG/0.4ML ~~LOC~~ SOLN
40.0000 mg | SUBCUTANEOUS | Status: DC
  Administered 2018-09-05: 40 mg via SUBCUTANEOUS
  Filled 2018-09-05: qty 0.4

## 2018-09-05 NOTE — Progress Notes (Signed)
VAST consulted to call pharmacy. Spoke with pharmacy staff who stated this patient would be receiving IVIG X 2 days starting 09/05/18 and informed that dose would be due at noon. VAS RN verbalized understanding and entered consult.

## 2018-09-05 NOTE — Progress Notes (Signed)
  Speech Language Pathology Treatment: Dysphagia  Patient Details Name: Lisa Mcdowell MRN: [id number] DOB: 02/16/1950 Today's Date: 09/05/2018 Time: [id number] SLP Time Calculation (min) (ACUTE ONLY): 10 min  Assessment / Plan / Recommendation Clinical Impression  Biopsy results pending; started IVIG today. Pt's mood appears brighter.  Son is present.  Trials of ice chips/water were successful today for the first time since admission - pt with significant delay in initiation of the swallow, but it is achievable and pt perceives improved ability. No concerns for aspiration.  Recommended to her that she have ice chips/sips of water this weekend according to her discretion.  Will f/u next week.   HPI HPI: This is a 68 year old female with a PMH significant for dermatomysitis which appears to be a fairly new diagnosis for her. Patient was recently hospitalized for 7/26-7/31 for dermatomyositis and was subsequently treated with IV steroids.  She is discharged with p.o. prednisone.  Patient presented to the ED with a 4 day h/o progressively worsening dysphagia characterized by globus.       SLP Plan  Continue with current plan of care       Recommendations  Diet recommendations: (sips and chips) Liquids provided via: Teaspoon                Oral Care Recommendations: Oral care QID SLP Visit Diagnosis: Dysphagia, oropharyngeal phase (R13.12) Plan: Continue with current plan of care       GO                Lisa Mcdowell 09/05/2018, 3:00 PM  Ezabella Teska L. Tivis Ringer, [place] Office number [phone] Pager [phone]

## 2018-09-05 NOTE — Progress Notes (Signed)
   Subjective:   Lisa Mcdowell reports that she is doing okay this morning.  She does have some pain around the surgical biopsy site, however the pain is mild.  She states her muscle weakness in her bilateral upper extremities is unchanged.  She denies any chest pain, shortness of breath.  We discussed starting IVIG today and went over a few possible side effects, including headache, injection site reaction, cardiac, GI.  All questions and concerns were addressed.  Objective:  Vital signs in last 24 hours: Vitals:   09/04/18 2006 09/05/18 0012 09/05/18 0403 09/05/18 0428  BP: 125/70 132/70 126/75   Pulse: 92 82 80   Resp: 17 16 17    Temp: 98.2 F (36.8 C) 98.5 F (36.9 C) 98.3 F (36.8 C)   TempSrc: Oral Oral Oral   SpO2: 98% 96% 98%   Weight:    78.7 kg  Height:       Physical Exam Constitutional:      General: She is not in acute distress.    Appearance: She is normal weight. She is not toxic-appearing.  Musculoskeletal:        General: Swelling (LUE and RUE with forearm. ) present.  Skin:    General: Skin is warm and dry.  Neurological:     Mental Status: She is alert and oriented to person, place, and time. Mental status is at baseline.     Motor: Weakness (Stable muscle weakness from prior exams) present.  Psychiatric:        Mood and Affect: Mood normal.        Behavior: Behavior normal.    Assessment/Plan:  Principal Problem:   Dysphagia Active Problems:   Secondary rhabdomyolysis   Adult type dermatomyositis (HCC)   Transaminitis   Elevated liver enzymes  #Dysphagia, Oropharyngeal #Dermatomyositis Lisa Mcdowell is admitted with complaints of dysphagia in light of recent dermatomyositis diagnosis and treatment with prednisone 60 mg, taken only for approximately 2 weeks before dysphasia onset. MBS showed oropharyngeal dysfunction without no obvious esophageal involvment.  Literature shows this can be due to the skeletal muscle dysfunction. EGD that did not show  any esophageal abnormalities.   Muscle biopsy of the right upper thigh was performed yesterday, and pathology was sent out.  Spoke with pathologist this morning who states that due to specificity, this is a send out.  Results are expected in 1 week or more.  Given this delay in results, we decided initiate IVIG treatment today.  MRI confirmed that this is definite myositis and IVIG can be used in other types of myositis such as polymyositis.  Patient was placed on telemetry given multitude of side effects.  Closely monitor for any changes in status.  - Solumedrol 40mg  BID - NG Tube feeds  - Biopsy results pending - IVIG 1000mg  x 2 days - Telemetry   #Secondary Rhadomyolysis #Elevated LFTs Rheumatology has previously stated that CK will remain elevated for an extended period time before completely returning to normal.  CK, AST and ALT have remained stable in the past couple days.  Her tube feeds have been adequate in terms of hydration, so will D/C V fluids at this time.  If any worsening in CK, will restart.  - Trend LFTs - Trend CK   Dispo: Anticipated discharge pending medical improvement  Dr. Jose Persia Internal Medicine PGY-1  Pager: [phone] 09/05/2018, 6:41 AM

## 2018-09-05 NOTE — Final Consult Note (Signed)
Consultant Final Sign-Off Note    Assessment/Final recommendations  Lisa Mcdowell is a 68 y.o. female followed by me for   S/P Right thigh muscle biopsy of the vastus lateralis complex, Dr. Brantley Stage, 08/20   Wound care (if applicable): R thigh wound with dermabond, okay to shower over, no bathing.    Diet at discharge: per primary team   Activity at discharge: per primary team   Follow-up appointment:  None needed, call with any questions or concerns   Pending results:  Unresulted Labs (From admission, onward)    Start     Ordered   09/03/18 0500  CK  Daily,   R    Question:  Specimen collection method  Answer:  Unit=Unit collect   09/02/18 1307   09/02/18 0500  Comprehensive metabolic panel  Daily,   R    Question:  Specimen collection method  Answer:  Unit=Unit collect   09/01/18 1600           Medication recommendations:   Other recommendations: medicine to follow surg path    Thank you for allowing [place] to participate in the care of your patient!  Please consult [place] again if you have further needs for your patient.  Kalman Drape 09/05/2018 9:20 AM    Subjective   CC: mild right thigh burning sensation  No issues overnight. Pt is having minimal pain of her R thigh. [employer] interpreter was used. Nurse at bedside.   Objective  Vital signs in last 24 hours: Temp:  [98 F (36.7 C)-99.1 F (37.3 C)] 99.1 F (37.3 C) (08/21 0913) Pulse Rate:  [75-93] 79 (08/21 0913) Resp:  [16-18] 18 (08/21 0913) BP: (117-138)/(68-77) 123/72 (08/21 0913) SpO2:  [93 %-99 %] 99 % (08/21 0913) Weight:  [78 kg-78.7 kg] 78.7 kg (08/21 0428)  PE: Gen: Alert, NAD, pleasant, cooperative HEENT: head is normocephalic, atraumatic. Sclera are noninjected. Pupils equal and round. Cortrak in place Pulm:Rate andeffort normal Skin: R anterior mid thigh incision with glue intact. No surrounding erythema, no drainage or bleeding, very minimal TTP.     Pertinent labs and  Studies: No results for input(s): WBC, HGB, HCT in the last 72 hours. BMET Recent Labs    09/04/18 0430 09/05/18 0410  NA 137 137  K 3.8 3.9  CL 101 101  CO2 27 27  GLUCOSE 148* 107*  BUN 9 12  CREATININE 0.37* 0.39*  CALCIUM 8.1* 8.0*   No results for input(s): LABURIN in the last 72 hours. Results for orders placed or performed during the hospital encounter of 08/25/18  SARS CORONAVIRUS 2 Nasal Swab Aptima Multi Swab     Status: None   Collection Time: 08/25/18  5:43 PM   Specimen: Aptima Multi Swab; Nasal Swab  Result Value Ref Range Status   SARS Coronavirus 2 NEGATIVE NEGATIVE Final    Comment: (NOTE) SARS-CoV-2 target nucleic acids are NOT DETECTED. The SARS-CoV-2 RNA is generally detectable in upper and lower respiratory specimens during the acute phase of infection. Negative results do not preclude SARS-CoV-2 infection, do not rule out co-infections with other pathogens, and should not be used as the sole basis for treatment or other patient management decisions. Negative results must be combined with clinical observations, patient history, and epidemiological information. The expected result is Negative. Fact Sheet for Patients: SugarRoll.be Fact Sheet for Healthcare Providers: https://www.woods-mathews.com/ This test is not yet approved or cleared by the [place] FDA and  has been authorized for detection and/or diagnosis of  SARS-CoV-2 by FDA under an Emergency Use Authorization (EUA). This EUA will remain  in effect (meaning this test can be used) for the duration of the COVID-19 declaration under Section 56 4(b)(1) of the Act, 21 U.S.C. section 360bbb-3(b)(1), unless the authorization is terminated or revoked sooner. Performed at [facility], [place] [address] [place], [place] [address]   MRSA PCR Screening     Status: None   Collection Time: 09/03/18  5:27 AM   Specimen: Nasal Mucosa; Nasopharyngeal   Result Value Ref Range Status   MRSA by PCR NEGATIVE NEGATIVE Final    Comment:        The GeneXpert MRSA Assay (FDA approved for NASAL specimens only), is one component of a comprehensive MRSA colonization surveillance program. It is not intended to diagnose MRSA infection nor to guide or monitor treatment for MRSA infections. Performed at [facility], [place] [address] [place], [place] [address]     Imaging: No results found.

## 2018-09-06 DIAGNOSIS — D696 Thrombocytopenia, unspecified: Secondary | ICD-10-CM | POA: Diagnosis present

## 2018-09-06 LAB — COMPREHENSIVE METABOLIC PANEL
ALT: 136 U/L — ABNORMAL HIGH (ref 0–44)
AST: 321 U/L — ABNORMAL HIGH (ref 15–41)
Albumin: 1.9 g/dL — ABNORMAL LOW (ref 3.5–5.0)
Alkaline Phosphatase: 47 U/L (ref 38–126)
Anion gap: 7 (ref 5–15)
BUN: 15 mg/dL (ref 8–23)
CO2: 27 mmol/L (ref 22–32)
Calcium: 7.6 mg/dL — ABNORMAL LOW (ref 8.9–10.3)
Chloride: 100 mmol/L (ref 98–111)
Creatinine, Ser: 0.41 mg/dL — ABNORMAL LOW (ref 0.44–1.00)
GFR calc Af Amer: >60 mL/min (ref >60)
GFR calc non Af Amer: >60 mL/min (ref >60)
Glucose, Bld: 200 mg/dL — ABNORMAL HIGH (ref 70–99)
Potassium: 3.9 mmol/L (ref 3.5–5.1)
Sodium: 134 mmol/L — ABNORMAL LOW (ref 135–145)
Total Bilirubin: 0.8 mg/dL (ref 0.3–1.2)
Total Protein: 6.8 g/dL (ref 6.5–8.1)

## 2018-09-06 LAB — IMMATURE PLATELET FRACTION: Immature Platelet Fraction: 4.5 % (ref 1.2–8.6)

## 2018-09-06 LAB — TECHNOLOGIST SMEAR REVIEW

## 2018-09-06 LAB — CBC WITH DIFFERENTIAL/PLATELET
Abs Immature Granulocytes: 0.05 10*3/uL (ref 0.00–0.07)
Basophils Absolute: 0 10*3/uL (ref 0.0–0.1)
Basophils Relative: 0 %
Eosinophils Absolute: 0 10*3/uL (ref 0.0–0.5)
Eosinophils Relative: 0 %
HCT: 35.6 % — ABNORMAL LOW (ref 36.0–46.0)
Hemoglobin: 11.9 g/dL — ABNORMAL LOW (ref 12.0–15.0)
Immature Granulocytes: 1 %
Lymphocytes Relative: 7 %
Lymphs Abs: 0.4 10*3/uL — ABNORMAL LOW (ref 0.7–4.0)
MCH: 33.1 pg (ref 26.0–34.0)
MCHC: 33.4 g/dL (ref 30.0–36.0)
MCV: 99.2 fL (ref 80.0–100.0)
Monocytes Absolute: 0.3 10*3/uL (ref 0.1–1.0)
Monocytes Relative: 5 %
Neutro Abs: 5.3 10*3/uL (ref 1.7–7.7)
Neutrophils Relative %: 87 %
Platelets: 96 10*3/uL — ABNORMAL LOW (ref 150–400)
RBC: 3.59 MIL/uL — ABNORMAL LOW (ref 3.87–5.11)
RDW: 13.7 % (ref 11.5–15.5)
WBC: 6 10*3/uL (ref 4.0–10.5)
nRBC: 0 % (ref 0.0–0.2)

## 2018-09-06 LAB — CBC
HCT: 41.8 % (ref 36.0–46.0)
Hemoglobin: 14 g/dL (ref 12.0–15.0)
MCH: 32.8 pg (ref 26.0–34.0)
MCHC: 33.5 g/dL (ref 30.0–36.0)
MCV: 97.9 fL (ref 80.0–100.0)
Platelets: 71 10*3/uL — ABNORMAL LOW (ref 150–400)
RBC: 4.27 MIL/uL (ref 3.87–5.11)
RDW: 13.6 % (ref 11.5–15.5)
WBC: 4.7 10*3/uL (ref 4.0–10.5)
nRBC: 0 % (ref 0.0–0.2)

## 2018-09-06 LAB — GLUCOSE, CAPILLARY
Glucose-Capillary: 103 mg/dL — ABNORMAL HIGH (ref 70–99)
Glucose-Capillary: 146 mg/dL — ABNORMAL HIGH (ref 70–99)
Glucose-Capillary: 151 mg/dL — ABNORMAL HIGH (ref 70–99)
Glucose-Capillary: 155 mg/dL — ABNORMAL HIGH (ref 70–99)
Glucose-Capillary: 162 mg/dL — ABNORMAL HIGH (ref 70–99)
Glucose-Capillary: 208 mg/dL — ABNORMAL HIGH (ref 70–99)

## 2018-09-06 LAB — CK
Total CK: 10640 U/L — ABNORMAL HIGH (ref 38–234)
Total CK: 4736 U/L — ABNORMAL HIGH (ref 38–234)

## 2018-09-06 LAB — APTT: aPTT: 45 seconds — ABNORMAL HIGH (ref 24–36)

## 2018-09-06 MED ORDER — SODIUM CHLORIDE 0.9 % IV SOLN
0.1980 mg/kg/h | INTRAVENOUS | Status: DC
  Administered 2018-09-06: 18:00:00 0.1 mg/kg/h via INTRAVENOUS
  Administered 2018-09-07 – 2018-09-09 (×3): 0.198 mg/kg/h via INTRAVENOUS
  Filled 2018-09-06 (×4): qty 250

## 2018-09-06 MED ORDER — INSULIN ASPART 100 UNIT/ML ~~LOC~~ SOLN
2.0000 [IU] | Freq: Once | SUBCUTANEOUS | Status: AC
  Administered 2018-09-06: 05:00:00 2 [IU] via SUBCUTANEOUS

## 2018-09-06 NOTE — Progress Notes (Signed)
Pharmacy Heparin Induced Thrombocytopenia (HIT) Note:  Lisa Mcdowell is an 68 y.o. female being evaluated for HIT. Enoxaparin was started 8/10  and baseline platelets were 224.  -enoxaparin 8/10>>8/19 and 8/21  HIT labs were ordered on 8/22 when platelet count was 71.  Platelet count on 10/10 was 224. Platelet count has dropped by more than 50% since lovenox start which was about 13 days ago. No other drug causes are noted. With rhabdomyolysis there have been some reports of DIC.   Auto-populate labs: No results found for: HEPINDPLTAB, SRALOWDOSEHP, SRAHIGHDOSEH    CALCULATE SCORE:  4Ts (see the HIT Algorithm) Score  Thrombocytopenia 2  Timing 1-2   Thrombosis 0  Other causes of thrombocytopenia 1  Total 4-5     Recommendations (A or B or C) are based on available lab results:  A. No lab results available (HIT antibody and/or SRA ordered): -Moderate HIT probability: Discontinue heparin/LMWH; HIT antibody recommended; SRA may be considered, consider alternative anticoagulation; document heparin allergy  B. HIT antibody result available: -N/A - HIT antibody not available, and/or SRA not available  C. SRA result available -SRA not available  Name of MD Contacted: Dr. Philmore Pali  Plan (Discussed with provider) Labs ordered: -Heparin antibody has been ordered Heparin allergy: -documented or updated Anticoagulation plans -Begin alternative anticoagulation with bivalirudin   Comments (List any alternative plans or if there are contraindications to therapy)  Hildred Laser, PharmD Clinical Pharmacist **Pharmacist phone directory can now be found on [email or web address] ([email or web address]).  Listed under [place].

## 2018-09-06 NOTE — Progress Notes (Addendum)
ANTICOAGULATION CONSULT NOTE - Initial Consult  Pharmacy Consult for bivalirudin Indication: possible HIT  Allergies  Allergen Reactions  . Heparin     Possible HIT; HIT antibody pending    Patient Measurements: Height: 5' 5.98" (167.6 cm) Weight: 165 lb 12.6 oz (75.2 kg) IBW/kg (Calculated) : 59.26   Vital Signs: Temp: 99.2 F (37.3 C) (08/22 1131) Temp Source: Oral (08/22 1131) BP: 123/71 (08/22 1131) Pulse Rate: 74 (08/22 1131)  Labs: Recent Labs    09/04/18 0430 09/05/18 0410 09/06/18 0423 09/06/18 0711 09/06/18 1237  HGB  --   --  11.9*  --  14.0  HCT  --   --  35.6*  --  41.8  PLT  --   --  96*  --  71*  CREATININE 0.37* 0.39* 0.41*  --   --   CKTOTAL 4,874* 4,910* 10,640* 4,736*  --     Estimated Creatinine Clearance: 69.8 mL/min (A) (by C-G formula based on SCr of 0.41 mg/dL (L)).   Medical History: Past Medical History:  Diagnosis Date  . Cholelithiasis   . Dermatomycosis   . Gallstones 08/08/2010  . GERD (gastroesophageal reflux disease)    pt denies having GERD on day of surgery  . Hyperlipidemia   . 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 Pharmacy consulted to dose bivalirudin (avoiding argatroban; LFTs noted 321/136). Lower dosing of bivalirudin may be needed due to liver function  HIT labs were ordered on 8/22 when platelet count was 71.  Platelet count on 10/10 was 224. Platelet count has dropped by more than 50% since lovenox start which was about 13 days ago. Timing is typically 5-10 days but can occur out to 14 days.   No other drug causes are noted. With rhabdomyolysis there have been some reports of DIC -SCr= 0.41, Hg= 14  Goal of Therapy:  APTT= 50-85 Monitor platelets by anticoagulation protocol: Yes   Plan:  -start bivalirudin at 0.1 mg/kg/hr -aPTT in 2 hours and daily -CBC daily -heparin antibody pending  Hildred Laser, PharmD Clinical Pharmacist **Pharmacist  phone directory can now be found on [email or web address] ([email or web address]).  Listed under [place].  Addendum -initial aPTT= 45   Plan  -Increase bivalirudin to 0.125 mcg/kg/hr -aPTT in 2 hours and daily  Hildred Laser, PharmD Clinical Pharmacist **Pharmacist phone directory can now be found on [email or web address] ([email or web address]).  Listed under [place].

## 2018-09-06 NOTE — Progress Notes (Signed)
Subjective:   Patient states that she feels her muscles are better now bother arms continue to feel heavy. Her UE swelling has improved. Her pain has improved but she continues to have a little pain. She has a desire to eat and is looking forward to eating. She was able to swallow some water yesterday and is excited about that. Discussed that we are treating her with IVIF and today will be the last day. She wants to see her grandchildren. All questions and concerns addressed.   Objective:  Vital signs in last 24 hours: Vitals:   09/05/18 1948 09/05/18 2340 09/06/18 0341 09/06/18 0500  BP: 129/67 127/71 122/68   Pulse: 96 90 81   Resp: 18 18 18    Temp: 99.3 F (37.4 C) 99.4 F (37.4 C) 98.9 F (37.2 C)   TempSrc: Oral Oral Oral   SpO2: 98% 98% 96%   Weight:    75.2 kg  Height:       Physical Exam Constitutional:      General: She is not in acute distress.    Appearance: She is normal weight. She is not toxic-appearing.  Musculoskeletal:        General: Swelling (LUE and RUE with forearm. ) present.  Skin:    General: Skin is warm and dry.  Neurological:     Mental Status: She is alert and oriented to person, place, and time. Mental status is at baseline.     Motor: Weakness (Stable muscle weakness from prior exams) present.  Psychiatric:        Mood and Affect: Mood normal.        Behavior: Behavior normal.    Assessment/Plan:  Principal Problem:   Dysphagia Active Problems:   Secondary rhabdomyolysis   Adult type dermatomyositis (HCC)   Transaminitis   Elevated liver enzymes  #Dysphagia, Oropharyngeal #Dermatomyositis Ms. Dobry is admitted with complaints of dysphagia in light of recent dermatomyositis diagnosis and treatment with prednisone 60 mg, taken only for approximately 2 weeks before dysphasia onset. MBS showed oropharyngeal dysfunction without no obvious esophageal involvment.  Literature shows this can be due to the skeletal muscle dysfunction. EGD  that did not show any esophageal abnormalities.   Muscle biopsy of the right upper thigh was performed on 8/20, and pathology was sent out. Results are expected in 1 week or more.  IVIG was initiated yesterday around noon, with the plan to do 2 days of 1000 mg each.  Patient has been tolerating treatment well and already feels like she has been having improvement in her swelling.  She was able to swallow some yesterday with SLP after 3 hours of IVIG which indicates likely placebo at that time though.  Until resolution of dysphagia, will continue with the NG tube.  Given multitude of IVIG side effects, patient is on telemetry and vital signs every 4 hours.  Her platelets did drop this morning to 96 from 199 yesterday.  It is not listed as a possible side effect of IVIG, but we will follow-up with repeat labs to ensure accuracy.  Monitor closely.  - Solumedrol 40mg  BID - NG Tube feeds  - Biopsy results pending - IVIG 1000mg  x 2 days - Telemetry - Vitals signs q4hrs - Repeat CBC pending   #Secondary Rhadomyolysis #Elevated LFTs Rheumatology has previously stated that CK will remain elevated for an extended period time before completely returning to normal.  She will CK this morning came back in the 10,000's, was highly unusual given that  has not been that high in weeks.  Repeat CK shows a more reasonable number of 4700s.   - Trend LFTs - Trend CK  #Thrombocytopenia:  New thrombocytopenia at 96 this morning after initiating IVIG and restarting Lovenox yesterday.  No obvious signs of bleed or clot.  We will repeat CBC to ensure accuracy.  IVIG does have a high risk of thrombosis, so we will keep that in mind.  - Repeat CBC   Dispo: Anticipated discharge pending medical improvement  Dr. Jose Persia Internal Medicine PGY-1  Pager: [phone] 09/06/2018, 6:47 AM

## 2018-09-07 LAB — COMPREHENSIVE METABOLIC PANEL
ALT: 142 U/L — ABNORMAL HIGH (ref 0–44)
AST: 353 U/L — ABNORMAL HIGH (ref 15–41)
Albumin: 2 g/dL — ABNORMAL LOW (ref 3.5–5.0)
Alkaline Phosphatase: 48 U/L (ref 38–126)
Anion gap: 8 (ref 5–15)
BUN: 14 mg/dL (ref 8–23)
CO2: 27 mmol/L (ref 22–32)
Calcium: 7.9 mg/dL — ABNORMAL LOW (ref 8.9–10.3)
Chloride: 98 mmol/L (ref 98–111)
Creatinine, Ser: 0.37 mg/dL — ABNORMAL LOW (ref 0.44–1.00)
GFR calc Af Amer: >60 mL/min (ref >60)
GFR calc non Af Amer: >60 mL/min (ref >60)
Glucose, Bld: 147 mg/dL — ABNORMAL HIGH (ref 70–99)
Potassium: 3.7 mmol/L (ref 3.5–5.1)
Sodium: 133 mmol/L — ABNORMAL LOW (ref 135–145)
Total Bilirubin: 0.8 mg/dL (ref 0.3–1.2)
Total Protein: 6.1 g/dL — ABNORMAL LOW (ref 6.5–8.1)

## 2018-09-07 LAB — CBC WITH DIFFERENTIAL/PLATELET
Abs Immature Granulocytes: 0.08 10*3/uL — ABNORMAL HIGH (ref 0.00–0.07)
Basophils Absolute: 0 10*3/uL (ref 0.0–0.1)
Basophils Relative: 0 %
Eosinophils Absolute: 0 10*3/uL (ref 0.0–0.5)
Eosinophils Relative: 0 %
HCT: 35.6 % — ABNORMAL LOW (ref 36.0–46.0)
Hemoglobin: 11.7 g/dL — ABNORMAL LOW (ref 12.0–15.0)
Immature Granulocytes: 1 %
Lymphocytes Relative: 12 %
Lymphs Abs: 0.7 10*3/uL (ref 0.7–4.0)
MCH: 32.7 pg (ref 26.0–34.0)
MCHC: 32.9 g/dL (ref 30.0–36.0)
MCV: 99.4 fL (ref 80.0–100.0)
Monocytes Absolute: 0.3 10*3/uL (ref 0.1–1.0)
Monocytes Relative: 5 %
Neutro Abs: 5.2 10*3/uL (ref 1.7–7.7)
Neutrophils Relative %: 82 %
Platelets: 61 10*3/uL — ABNORMAL LOW (ref 150–400)
RBC: 3.58 MIL/uL — ABNORMAL LOW (ref 3.87–5.11)
RDW: 13.8 % (ref 11.5–15.5)
WBC: 6.4 10*3/uL (ref 4.0–10.5)
nRBC: 0 % (ref 0.0–0.2)

## 2018-09-07 LAB — GLUCOSE, CAPILLARY
Glucose-Capillary: 156 mg/dL — ABNORMAL HIGH (ref 70–99)
Glucose-Capillary: 166 mg/dL — ABNORMAL HIGH (ref 70–99)
Glucose-Capillary: 156 mg/dL — ABNORMAL HIGH (ref 70–99)
Glucose-Capillary: 113 mg/dL — ABNORMAL HIGH (ref 70–99)
Glucose-Capillary: 167 mg/dL — ABNORMAL HIGH (ref 70–99)

## 2018-09-07 LAB — APTT
aPTT: 48 seconds — ABNORMAL HIGH (ref 24–36)
aPTT: 49 seconds — ABNORMAL HIGH (ref 24–36)
aPTT: 52 seconds — ABNORMAL HIGH (ref 24–36)
aPTT: 57 seconds — ABNORMAL HIGH (ref 24–36)
aPTT: 59 seconds — ABNORMAL HIGH (ref 24–36)
aPTT: 62 seconds — ABNORMAL HIGH (ref 24–36)

## 2018-09-07 LAB — CK: Total CK: 4510 U/L — ABNORMAL HIGH (ref 38–234)

## 2018-09-07 MED ORDER — IMMUNE GLOBULIN (HUMAN) 5 GM/50ML IV SOLN
1.0000 g/kg | Freq: Once | INTRAVENOUS | Status: AC
  Administered 2018-09-07 (×3): 75 g via INTRAVENOUS
  Filled 2018-09-07: qty 50

## 2018-09-07 NOTE — Progress Notes (Addendum)
Subjective:   Lisa Mcdowell is very tearful today. She states that she is tired and sad. She wants this to be over. She just wants to get back to her normal health. She states that she is a good person and works very hard feels is unfair that this is happening to her. She is not tried to eat or drink anything over the weekend. She states that her upper extremities still feel heavy. She has no new pain anywhere. She does feel her rash is improving.  We discussed that the IVIG was stopped yesterday due to a low platelet count. We are continuing to monitor workup or thrombocytopenia. We will restart the IVIG once we've determined the cause of her thrombocytopenia.  I will come up this afternoon to talk with her family more at bedside.  Objective:  Vital signs in last 24 hours: Vitals:   09/06/18 2334 09/07/18 0353 09/07/18 0500 09/07/18 0804  BP: 133/86 121/66  128/70  Pulse: 80 76  80  Resp: 18 18  18   Temp: 98.7 F (37.1 C) 98.6 F (37 C)  99 F (37.2 C)  TempSrc: Oral Oral  Oral  SpO2: 98% 98%  98%  Weight:   75 kg   Height:       Physical Exam Constitutional:      General: She is not in acute distress.    Appearance: She is normal weight. She is not toxic-appearing.  Musculoskeletal:        General: Swelling (LUE and RUE with forearm. ) present.  Skin:    General: Skin is warm and dry.  Neurological:     Mental Status: She is alert and oriented to person, place, and time. Mental status is at baseline.     Motor: Weakness (Stable muscle weakness from prior exams) present.  Psychiatric:        Mood and Affect: Mood normal.        Behavior: Behavior normal.    Assessment/Plan:  Principal Problem:   Dysphagia Active Problems:   Secondary rhabdomyolysis   Adult type dermatomyositis (HCC)   Transaminitis   Elevated liver enzymes   Thrombocytopenia (HCC)  #Dysphagia, Oropharyngeal #Dermatomyositis Lisa Mcdowell is admitted with complaints of dysphagia in light of  recent dermatomyositis diagnosis and treatment with prednisone 60 mg, taken only for approximately 2 weeks before dysphasia onset. MBS showed oropharyngeal dysfunction without no obvious esophageal involvment.  Literature shows this can be due to the skeletal muscle dysfunction. EGD that did not show any esophageal abnormalities.   Muscle biopsy of the right upper thigh was performed on 8/20, and pathology was sent out. Results are expected in 1 week or more.  IVIG was initiated yesterday around noon, with the plan to do 2 days of 1000 mg each. Patient tolerated the first dose of IVIG well; however, had a profound drop in her plt count. We will continue to hold the 2nd dose of IVIG while we await to see if her plt count increases. She will continue to work with SLP on swallowing. Until resolution of dysphagia, will continue with the NG tube.  - Solumedrol 40mg  BID - NG Tube feeds  - Biopsy results pending - IVIG 1000mg  on 8/21, holding second dose for now - Telemetry - Vitals signs q4hrs - Repeat CBC pending   #Secondary Rhadomyolysis #Elevated LFTs Rheumatology has previously stated that CK will remain elevated for an extended period time before completely returning to normal.  Her CK is stable this AM around  4500.  Her LFTs remain stable as well.   - Trend LFTs - Trend CK  #Thrombocytopenia:  New thrombocytopenia on 8/22; however, we did not have a CBC since 8/12 so the exact time course is unknown. Continues to trend down to 61 today. HIT antibodies sent out on 8/22 and started on Bivalirudin. No splenomegaly on PE to suggest sequestration. Smear without schistocytes and hemoglobin stable to suggest against consumption/destruction. Medications reviewed, she has not received any other medications known to cause thrombocytopenia within the past 2-3 weeks. Her immature platelet fraction is inappropriately normal for the degree of thrombocytopenia she has. Indicating that there may be bone marrow  suppression.   - Continue to monitor - If <50K with bleeding or <10K without bleeding transfuse   Dispo: Anticipated discharge pending medical improvement  [name], MD  IMTS PGY3  Pager: [phone] 09/07/2018, 10:12 AM

## 2018-09-07 NOTE — Progress Notes (Signed)
ANTICOAGULATION CONSULT NOTE - Initial Consult  Pharmacy Consult for bivalirudin Indication: possible HIT  Allergies  Allergen Reactions  . Heparin     Possible HIT; HIT antibody pending    Patient Measurements: Height: 5' 5.98" (167.6 cm) Weight: 165 lb 5.5 oz (75 kg) IBW/kg (Calculated) : 59.26   Vital Signs: Temp: 98.5 F (36.9 C) (08/23 1135) Temp Source: Oral (08/23 1135) BP: 134/63 (08/23 1135) Pulse Rate: 79 (08/23 1135)  Labs: Recent Labs    09/05/18 0410  09/06/18 0423 09/06/18 0711 09/06/18 1237  09/07/18 0000 09/07/18 0525 09/07/18 1125  HGB  --    < > 11.9*  --  14.0  --   --  11.7*  --   HCT  --   --  35.6*  --  41.8  --   --  35.6*  --   PLT  --   --  96*  --  71*  --   --  61*  --   APTT  --   --   --   --   --    < > 57* 52* 48*  CREATININE 0.39*  --  0.41*  --   --   --   --  0.37*  --   CKTOTAL 4,910*  --  10,640* 4,736*  --   --   --  4,510*  --    < > = values in this interval not displayed.    Estimated Creatinine Clearance: 69.7 mL/min (A) (by C-G formula based on SCr of 0.37 mg/dL (L)).  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 Low likelihood of HIT - also plt were not checked for 10 days  Hep Ab in process aPTT now low at 48 despite previous increase  CBC stable; Plt lower at 61  Goal of Therapy:  APTT= 50-85 Monitor platelets by anticoagulation protocol: Yes   Plan:  Increase bivalirudin to 0.165 mg/kg/hr 1600 aPTT Daily aPTT CBC Hep Ab - ip; adjust allergy when back  Levester Fresh, PharmD, BCPS, BCCCP Clinical Pharmacist [phone]  Please check AMION for all [place] numbers  09/07/2018 1:08 PM

## 2018-09-07 NOTE — Progress Notes (Addendum)
Received a return call from [facility]. They will accept the patient for transfer (accepting physician Lenox Ahr); however, they do not currently have any beds available. They will call back daily to check the status of the patient and inform us whether they have a bed available. I will ensure that the transfer summary and orders are in place.  If you receive a call that a bed is available please contact the internal medicine teaching service to sign her transfer orders and summary.  I have spoke to the patient and her family.  [name], MD IMTS PGY3  Pager: [phone]

## 2018-09-07 NOTE — Progress Notes (Signed)
Internal Medicine MD paged to update the patient's daughter; page returned. Primary team will call the daughter.

## 2018-09-07 NOTE — Progress Notes (Signed)
IVIG started at 1650 by IV team, RN will assist with vitals

## 2018-09-07 NOTE — Progress Notes (Signed)
ANTICOAGULATION CONSULT NOTE - Follow Up Consult  Pharmacy Consult for bivalirudin Indication: r/o HIT  Labs: Recent Labs    09/05/18 0410  09/06/18 0423 09/06/18 0711 09/06/18 1237  09/07/18 0525  09/07/18 1548 09/07/18 2056 09/07/18 2322  HGB  --    < > 11.9*  --  14.0  --  11.7*  --   --   --   --   HCT  --   --  35.6*  --  41.8  --  35.6*  --   --   --   --   PLT  --   --  96*  --  71*  --  61*  --   --   --   --   APTT  --   --   --   --   --    < > 52*   < > 49* 59* 62*  CREATININE 0.39*  --  0.41*  --   --   --  0.37*  --   --   --   --   CKTOTAL 4,910*  --  10,640* 4,736*  --   --  4,510*  --   --   --   --    < > = values in this interval not displayed.    Assessment/Plan:  68yo female remains therapeutic on bivalirudin. Will continue gtt at current rate and monitor daily PTT.   Wynona Neat, PharmD, BCPS  09/07/2018,11:47 PM

## 2018-09-07 NOTE — Progress Notes (Signed)
[facility] for bivalirudin Indication: possible HIT  Allergies  Allergen Reactions  . Heparin     Possible HIT; HIT antibody pending    Patient Measurements: Height: 5' 5.98" (167.6 cm) Weight: 165 lb 5.5 oz (75 kg) IBW/kg (Calculated) : 59.26   Vital Signs: Temp: 98.6 F (37 C) (08/23 1700) Temp Source: Oral (08/23 1700) BP: 132/76 (08/23 1700) Pulse Rate: 74 (08/23 1700)  Labs: Recent Labs    09/05/18 0410  09/06/18 0423 09/06/18 0711 09/06/18 1237  09/07/18 0525 09/07/18 1125 09/07/18 1548  HGB  --    < > 11.9*  --  14.0  --  11.7*  --   --   HCT  --   --  35.6*  --  41.8  --  35.6*  --   --   PLT  --   --  96*  --  71*  --  61*  --   --   APTT  --   --   --   --   --    < > 52* 48* 49*  CREATININE 0.39*  --  0.41*  --   --   --  0.37*  --   --   CKTOTAL 4,910*  --  10,640* 4,736*  --   --  4,510*  --   --    < > = values in this interval not displayed.    Estimated Creatinine Clearance: 69.7 mL/min (A) (by C-G formula based on SCr of 0.37 mg/dL (L)).  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 Low likelihood of HIT - also plt were not checked for 10 days  Hep Ab in process APTT remains low at 49 despite previous increase  Goal of Therapy:  APTT= 50-85 Monitor platelets by anticoagulation protocol: Yes   Plan:  Increase bivalirudin to 0.198 mg/kg/hr 2000 aPTT Daily aPTT CBC Hep Ab - ip; adjust allergy when back  Salome Arnt, PharmD, BCPS Please see AMION for all pharmacy numbers 09/07/2018 5:26 PM

## 2018-09-07 NOTE — Progress Notes (Signed)
ANTICOAGULATION CONSULT NOTE - Initial Consult  Pharmacy Consult for bivalirudin Indication: possible HIT  Allergies  Allergen Reactions  . Heparin     Possible HIT; HIT antibody pending    Patient Measurements: Height: 5' 5.98" (167.6 cm) Weight: 165 lb 5.5 oz (75 kg) IBW/kg (Calculated) : 59.26   Vital Signs: Temp: 99 F (37.2 C) (08/23 0804) Temp Source: Oral (08/23 0804) BP: 128/70 (08/23 0804) Pulse Rate: 80 (08/23 0804)  Labs: Recent Labs    09/05/18 0410  09/06/18 0423 09/06/18 0711 09/06/18 1237 09/06/18 2041 09/07/18 0000 09/07/18 0525  HGB  --    < > 11.9*  --  14.0  --   --  11.7*  HCT  --   --  35.6*  --  41.8  --   --  35.6*  PLT  --   --  96*  --  71*  --   --  61*  APTT  --   --   --   --   --  45* 57* 52*  CREATININE 0.39*  --  0.41*  --   --   --   --  0.37*  CKTOTAL 4,910*  --  10,640* 4,736*  --   --   --  4,510*   < > = values in this interval not displayed.    Estimated Creatinine Clearance: 69.7 mL/min (A) (by C-G formula based on SCr of 0.37 mg/dL (L)).  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 Low likelihood of HIT - also plt were not checked for 10 days  Hep Ab in process aPTT this am barely within goal at 52  CBC stable; Plt lower at 61  Goal of Therapy:  APTT= 50-85 Monitor platelets by anticoagulation protocol: Yes   Plan:  Slightly increase bivalirudin to 0.1375 mg/kg/hr 1200 aPTT Daily aPTT CBC Hep Ab - ip; adjust allergy when back  Levester Fresh, PharmD, BCPS, BCCCP Clinical Pharmacist [phone]  Please check AMION for all [place] numbers  09/07/2018 8:55 AM

## 2018-09-07 NOTE — Progress Notes (Signed)
Spoke with the patient's daughter about the potential transfer and the second dose of IVIG. She voices understanding. All questions and concerns addressed.  [name], MD  IMTS PGY3  Pager: [phone]

## 2018-09-07 NOTE — Progress Notes (Signed)
ANTICOAGULATION CONSULT NOTE - Follow Up Consult  Pharmacy Consult for bivalirudin Indication: r/o HIT  Labs: Recent Labs    09/04/18 0430 09/05/18 0410 09/06/18 0423 09/06/18 0711 09/06/18 1237 09/06/18 2041 09/07/18 0000  HGB  --   --  11.9*  --  14.0  --   --   HCT  --   --  35.6*  --  41.8  --   --   PLT  --   --  96*  --  71*  --   --   APTT  --   --   --   --   --  45* 57*  CREATININE 0.37* 0.39* 0.41*  --   --   --   --   CKTOTAL 4,874* 4,910* 10,640* 4,736*  --   --   --     Assessment/Plan:  68yo female therapeutic on bivalirudin after rate change. Will continue gtt at current rate and confirm stable with additional PTT.   Wynona Neat, PharmD, BCPS  09/07/2018,1:31 AM

## 2018-09-07 NOTE — Progress Notes (Signed)
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 Heparin     Possible HIT; HIT antibody pending    Patient Measurements: Height: 5' 5.98" (167.6 cm) Weight: 165 lb 5.5 oz (75 kg) IBW/kg (Calculated) : 59.26   Vital Signs: Temp: 98.1 F (36.7 C) (08/23 2152) Temp Source: Oral (08/23 2152) BP: 131/70 (08/23 2152) Pulse Rate: 73 (08/23 1800)  Labs: Recent Labs    09/05/18 0410  09/06/18 0423 09/06/18 0711 09/06/18 1237  09/07/18 0525 09/07/18 1125 09/07/18 1548 09/07/18 2056  HGB  --    < > 11.9*  --  14.0  --  11.7*  --   --   --   HCT  --   --  35.6*  --  41.8  --  35.6*  --   --   --   PLT  --   --  96*  --  71*  --  61*  --   --   --   APTT  --   --   --   --   --    < > 52* 48* 49* 59*  CREATININE 0.39*  --  0.41*  --   --   --  0.37*  --   --   --   CKTOTAL 4,910*  --  10,640* 4,736*  --   --  4,510*  --   --   --    < > = values in this interval not displayed.    Estimated Creatinine Clearance: 69.7 mL/min (A) (by C-G formula based on SCr of 0.37 mg/dL (L)).  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 Low likelihood of HIT - also plt were not checked for 10 days  Hep Ab in process APTT is therapeutic at 59 seconds on bivalirudin drip rate 0.198 mg/kg/hr.   Goal of Therapy:  APTT= 50-85 seconds Monitor platelets by anticoagulation protocol: Yes   Plan:  Continue bivalirudin to 0.198 mg/kg/hr  aPTT at 23:00 Daily aPTT CBC Hep Ab - ip; adjust allergy when back   Nicole Cella, RPh Please see AMION for all pharmacy numbers 09/07/2018 9:53 PM

## 2018-09-07 NOTE — Progress Notes (Signed)
Spoke with pharmacy. We will go ahead and give the second dose of IVIG today. Discussed care in management plan with nursing.

## 2018-09-07 NOTE — Progress Notes (Signed)
Spoke with the patient's son at bedside. Discussed why the IVIG was discontinued and our current plan. He is requesting a transfer to another facility. I have reached out to [facility] and they are at capacity and unable to facilitate a transfer. I am waiting to hear back from [facility] and [facility].   I attempted to call the patient's daughter. She did not answer. I left a HIPPA compliant voicemail and will try again later.   [name], MD  IMTS PGY3  Pager: [phone]

## 2018-09-08 ENCOUNTER — Encounter: Payer: Self-pay | Admitting: Gastroenterology

## 2018-09-08 LAB — CBC
HCT: 33.2 % — ABNORMAL LOW (ref 36.0–46.0)
Hemoglobin: 11 g/dL — ABNORMAL LOW (ref 12.0–15.0)
MCH: 32.7 pg (ref 26.0–34.0)
MCHC: 33.1 g/dL (ref 30.0–36.0)
MCV: 98.8 fL (ref 80.0–100.0)
Platelets: 54 10*3/uL — ABNORMAL LOW (ref 150–400)
RBC: 3.36 MIL/uL — ABNORMAL LOW (ref 3.87–5.11)
RDW: 13.5 % (ref 11.5–15.5)
WBC: 5.1 10*3/uL (ref 4.0–10.5)
nRBC: 0 % (ref 0.0–0.2)

## 2018-09-08 LAB — CK: Total CK: 4430 U/L — ABNORMAL HIGH (ref 38–234)

## 2018-09-08 LAB — COMPREHENSIVE METABOLIC PANEL
ALT: 132 U/L — ABNORMAL HIGH (ref 0–44)
AST: 329 U/L — ABNORMAL HIGH (ref 15–41)
Albumin: 1.8 g/dL — ABNORMAL LOW (ref 3.5–5.0)
Alkaline Phosphatase: 44 U/L (ref 38–126)
Anion gap: 5 (ref 5–15)
BUN: 16 mg/dL (ref 8–23)
CO2: 27 mmol/L (ref 22–32)
Calcium: 7.8 mg/dL — ABNORMAL LOW (ref 8.9–10.3)
Chloride: 101 mmol/L (ref 98–111)
Creatinine, Ser: 0.42 mg/dL — ABNORMAL LOW (ref 0.44–1.00)
GFR calc Af Amer: >60 mL/min (ref >60)
GFR calc non Af Amer: >60 mL/min (ref >60)
Glucose, Bld: 137 mg/dL — ABNORMAL HIGH (ref 70–99)
Potassium: 3.5 mmol/L (ref 3.5–5.1)
Sodium: 133 mmol/L — ABNORMAL LOW (ref 135–145)
Total Bilirubin: 0.7 mg/dL (ref 0.3–1.2)
Total Protein: 7.2 g/dL (ref 6.5–8.1)

## 2018-09-08 LAB — LACTATE DEHYDROGENASE: LDH: 626 U/L — ABNORMAL HIGH (ref 98–192)

## 2018-09-08 LAB — GLUCOSE, CAPILLARY
Glucose-Capillary: 103 mg/dL — ABNORMAL HIGH (ref 70–99)
Glucose-Capillary: 150 mg/dL — ABNORMAL HIGH (ref 70–99)
Glucose-Capillary: 163 mg/dL — ABNORMAL HIGH (ref 70–99)
Glucose-Capillary: 170 mg/dL — ABNORMAL HIGH (ref 70–99)
Glucose-Capillary: 187 mg/dL — ABNORMAL HIGH (ref 70–99)
Glucose-Capillary: 194 mg/dL — ABNORMAL HIGH (ref 70–99)
Glucose-Capillary: 203 mg/dL — ABNORMAL HIGH (ref 70–99)

## 2018-09-08 LAB — HEPARIN INDUCED PLATELET AB (HIT ANTIBODY): Heparin Induced Plt Ab: 0.221 OD (ref 0.000–0.400)

## 2018-09-08 LAB — APTT: aPTT: 57 seconds — ABNORMAL HIGH (ref 24–36)

## 2018-09-08 NOTE — Evaluation (Signed)
Physical Therapy Evaluation Patient Details Name: Lisa Mcdowell MRN: [id number] DOB: [birth year] Today's Date: 09/08/2018   History of Present Illness  Pt is a 68 y/o female admitted secondary to dysphagia. Pt with cortrack tube in place. Pt also with likely dermatomyositis and is s/p R upper thigh biopsy. Pt started on IVIG during admission. PMH includes GERD and HLD.   Clinical Impression  Pt admitted secondary to problem above with deficits below. Pt requiring mod to max A for mobility tasks with use of RW. Pt's daughter present in room and assisted with interpretation. Per notes and daughter, pt will likely be transferring to [facility] for further care. Feel pt will likely require post acute rehab, however, will await transfer and defer to next facility. Will continue to follow acutely to maximize functional mobility independence and safety.     Follow Up Recommendations Other (comment)(TBD pending transfer likely to [facility])    Equipment Recommendations  Other (comment)(TBD)    Recommendations for Other Services       Precautions / Restrictions Precautions Precautions: Fall;Other (comment) Precaution Comments: cortrack tube Restrictions Weight Bearing Restrictions: No      Mobility  Bed Mobility               General bed mobility comments: In chair upon entry  Transfers Overall transfer level: Needs assistance Equipment used: Rolling walker (2 wheeled) Transfers: Sit to/from Stand Sit to Stand: Max assist         General transfer comment: Max A for lift assist and steadying assist to stand. Cues for safe hand placement.   Ambulation/Gait Ambulation/Gait assistance: Mod assist;+2 safety/equipment Gait Distance (Feet): 15 Feet Assistive device: Rolling walker (2 wheeled) Gait Pattern/deviations: Step-through pattern;Decreased stride length;Trunk flexed Gait velocity: decreased   General Gait Details: Slow, unsteady gait. Pt lacking full knee extension bilaterally  throughout gait and was heavily reliant on UEs. Mod A for steadying assist.   Stairs            Wheelchair Mobility    Modified Rankin (Stroke Patients Only)       Balance Overall balance assessment: Needs assistance Sitting-balance support: No upper extremity supported Sitting balance-Leahy Scale: Good     Standing balance support: Bilateral upper extremity supported;During functional activity Standing balance-Leahy Scale: Poor Standing balance comment: Reliant on BUE support                              Pertinent Vitals/Pain Pain Assessment: Faces Faces Pain Scale: Hurts a little bit Pain Location: head Pain Descriptors / Indicators: Headache Pain Intervention(s): Limited activity within patient's tolerance;Monitored during session;Repositioned    Home Living Family/patient expects to be discharged to:: Private residence Living Arrangements: Children;Spouse/significant other Available Help at Discharge: Family Type of Home: House Home Access: Stairs to enter Entrance Stairs-Rails: None Entrance Stairs-Number of Steps: 1 Home Layout: One level Home Equipment: Environmental consultant - 2 wheels;Cane - single point      Prior Function Level of Independence: Independent         Comments: Daughter reports pt was independent with mobility, however, moved very slowly.      Hand Dominance        Extremity/Trunk Assessment   Upper Extremity Assessment Upper Extremity Assessment: Defer to OT evaluation    Lower Extremity Assessment Lower Extremity Assessment: Generalized weakness    Cervical / Trunk Assessment Cervical / Trunk Assessment: Kyphotic  Communication   Communication: Prefers language other than [place];Other (  comment)(Daughter interpreted throughout session)  Cognition Arousal/Alertness: Awake/alert Behavior During Therapy: WFL for tasks assessed/performed Overall Cognitive Status: Within Functional Limits for tasks assessed                                         General Comments General comments (skin integrity, edema, etc.): Pt's daughter present throughout session.     Exercises     Assessment/Plan    PT Assessment Patient needs continued PT services  PT Problem List Decreased strength;Decreased balance;Decreased mobility;Decreased knowledge of use of DME       PT Treatment Interventions Gait training;DME instruction;Functional mobility training;Therapeutic activities;Therapeutic exercise;Balance training;Patient/family education    PT Goals (Current goals can be found in the Care Plan section)  Acute Rehab PT Goals Patient Stated Goal: to get stronger PT Goal Formulation: With patient/family Time For Goal Achievement: 09/22/18 Potential to Achieve Goals: Good    Frequency Min 2X/week   Barriers to discharge        Co-evaluation               AM-PAC PT "6 Clicks" Mobility  Outcome Measure Help needed turning from your back to your side while in a flat bed without using bedrails?: A Little Help needed moving from lying on your back to sitting on the side of a flat bed without using bedrails?: A Little Help needed moving to and from a bed to a chair (including a wheelchair)?: A Lot Help needed standing up from a chair using your arms (e.g., wheelchair or bedside chair)?: Total Help needed to walk in hospital room?: A Lot Help needed climbing 3-5 steps with a railing? : Total 6 Click Score: 12    End of Session Equipment Utilized During Treatment: Gait belt Activity Tolerance: Patient tolerated treatment well Patient left: in chair;with call bell/phone within reach;with family/visitor present Nurse Communication: Mobility status PT Visit Diagnosis: Unsteadiness on feet (R26.81);Muscle weakness (generalized) (M62.81);Difficulty in walking, not elsewhere classified (R26.2)    Time: [id number] PT Time Calculation (min) (ACUTE ONLY): 20 min   Charges:   PT Evaluation $PT Eval  Moderate Complexity: [place], PT, DPT  Acute Rehabilitation Services  Pager: [phone] Office: [phone]   Rudean Hitt 09/08/2018, 6:43 PM

## 2018-09-08 NOTE — Progress Notes (Signed)
ANTICOAGULATION CONSULT NOTE - Initial Consult  Pharmacy Consult for bivalirudin Indication: possible HIT  Allergies  Allergen Reactions  . Heparin     Possible HIT; HIT antibody pending    Patient Measurements: Height: 5' 5.98" (167.6 cm) Weight: 165 lb 5.5 oz (75 kg) IBW/kg (Calculated) : 59.26   Vital Signs: Temp: 98.8 F (37.1 C) (08/24 0858) Temp Source: Oral (08/24 0858) BP: 125/69 (08/24 0858) Pulse Rate: 75 (08/24 0858)  Labs: Recent Labs    09/06/18 0423 09/06/18 0711 09/06/18 1237  09/07/18 0525  09/07/18 2056 09/07/18 2322 09/08/18 0500  HGB 11.9*  --  14.0  --  11.7*  --   --   --  11.0*  HCT 35.6*  --  41.8  --  35.6*  --   --   --  33.2*  PLT 96*  --  71*  --  61*  --   --   --  54*  APTT  --   --   --    < > 52*   < > 59* 62* 57*  CREATININE 0.41*  --   --   --  0.37*  --   --   --  0.42*  CKTOTAL 10,640* 4,736*  --   --  4,510*  --   --   --  4,430*   < > = values in this interval not displayed.    Estimated Creatinine Clearance: 69.7 mL/min (A) (by C-G formula based on SCr of 0.42 mg/dL (L)).  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 Low likelihood of HIT - also plt were not checked for 10 days  Hep Ab in process aPTT within goal x 2  CBC stable; Plt lower at 57 CK remains elevated in 4000s  S/p 2nd dose IVIG yesterday  Goal of Therapy:  APTT= 50-85 Monitor platelets by anticoagulation protocol: Yes   Plan:  Continue bivalirudin 0.198 mg/kg/hr Daily aPTT CBC Hep Ab - ip; adjust allergy when back  Levester Fresh, PharmD, BCPS, BCCCP Clinical Pharmacist [phone]  Please check AMION for all [place] numbers  09/08/2018 10:06 AM

## 2018-09-08 NOTE — Progress Notes (Addendum)
Subjective:   Lisa Mcdowell states she is doing a little bit better today. She notes that her dysphagia and muscle weakness feels slightly improved today. She had a lot of itching last night that caused her to be unable to sleep. This was partially alleviated by cold rags. She also had a headache that resolved with Tylenol.  Discussed with patient's daughter, Lisa Mcdowell, at bedside regarding how to optimize communication to make sure she feels in the loop. We agreed to always round between 10-12 to make sure either her or her brother could be present. Lisa Mcdowell states her brother will update her if she is not there. Lisa Mcdowell also notes that she was upset on Saturday due to unpleasant interactions with patient's nurse, and she just felt like she had to do something.  All questions and concerns addressed at this time.   Objective:  Vital signs in last 24 hours: Vitals:   09/07/18 2152 09/07/18 2222 09/07/18 2354 09/08/18 0327  BP: 131/70 132/69 131/79 121/72  Pulse:   78 84  Resp: 16 15 16 16   Temp: 98.1 F (36.7 C) 98.1 F (36.7 C) 97.8 F (36.6 C) 97.8 F (36.6 C)  TempSrc: Oral Oral Oral Oral  SpO2: 98% 98% 96% 97%  Weight:      Height:       Physical Exam Vitals signs and nursing note reviewed.  Constitutional:      Appearance: She is normal weight.  Skin:    General: Skin is warm and dry.     Comments: Improved peri-ocular rash.   Neurological:     Mental Status: She is alert and oriented to person, place, and time. Mental status is at baseline.    Assessment/Plan:  Principal Problem:   Dysphagia Active Problems:   Secondary rhabdomyolysis   Adult type dermatomyositis (HCC)   Transaminitis   Elevated liver enzymes   Thrombocytopenia [facility])  Lisa Mcdowell is a 68 y/o female with PMHx of recently diagnosed dermatomyositis who was admitted for new-onset dysphagia. Prior to admission, she had been prescribed Prednisone 60mg , with which patient was compliant until dysphagia  began 2-3 days before presenting to ED.   #Dysphagia, Oropharyngeal #Dermatomyositis Muscle biopsy of the right upper thigh was performed on 8/20, and pathology was sent out. Results are expected in 1 week or more. Second dose of IVIG completed yesterday.  Patient tolerated well besides itchiness and headache that was manageable. Downtrending CK correlates with symptomatic improvements.   - Solumedrol 40mg  BID - NG Tube feeds  - Biopsy results pending - Telemetry - Vitals signs q4hrs - Still pending [facility] transfer bed availability for formal rheumatology consult.    #Secondary Rhadomyolysis #Elevated LFTs Rheumatology has previously stated that CK will remain elevated for an extended period time before completely returning to normal.  Her CK continues to trend down but is stable.   - Trend LFTs - Trend CK  #Thrombocytopenia:  New thrombocytopenia on 8/22; however, we did not have a CBC since 8/12 so the exact time course is unknown. HIT antibodies sent out on 8/22 and started on Bivalirudin. Medications reviewed, she has not received any other medications known to cause thrombocytopenia within the past 2-3 weeks. Her immature platelet fraction is inappropriately normal for the degree of thrombocytopenia she has. Indicating that there may be bone marrow suppression. Bilirubin is WNL, making hemolysis unlikely, however haptoglobin, LDH and pathology smear review ordered for further evaluation. Hemoglobin is dropped slightly below normal limits since yesterday, however per chart review,  this has happened multiple times before.  - Continue to monitor with daily CBCs - If <50K with bleeding or <10K without bleeding transfuse  - HIT antibody pending - LDH, Haptoglobin pending - Pathologist smear review pending     Dispo: Anticipated discharge pending medical improvement  Dr. Jose Persia Internal Medicine PGY-1  Pager: [phone] 09/08/2018, 6:47 AM

## 2018-09-09 LAB — CBC
HCT: 34.6 % — ABNORMAL LOW (ref 36.0–46.0)
Hemoglobin: 11.5 g/dL — ABNORMAL LOW (ref 12.0–15.0)
MCH: 33.1 pg (ref 26.0–34.0)
MCHC: 33.2 g/dL (ref 30.0–36.0)
MCV: 99.7 fL (ref 80.0–100.0)
Platelets: 72 10*3/uL — ABNORMAL LOW (ref 150–400)
RBC: 3.47 MIL/uL — ABNORMAL LOW (ref 3.87–5.11)
RDW: 13.6 % (ref 11.5–15.5)
WBC: 5.5 10*3/uL (ref 4.0–10.5)
nRBC: 0.4 % — ABNORMAL HIGH (ref 0.0–0.2)

## 2018-09-09 LAB — GLUCOSE, CAPILLARY
Glucose-Capillary: 140 mg/dL — ABNORMAL HIGH (ref 70–99)
Glucose-Capillary: 168 mg/dL — ABNORMAL HIGH (ref 70–99)
Glucose-Capillary: 169 mg/dL — ABNORMAL HIGH (ref 70–99)
Glucose-Capillary: 112 mg/dL — ABNORMAL HIGH (ref 70–99)
Glucose-Capillary: 138 mg/dL — ABNORMAL HIGH (ref 70–99)

## 2018-09-09 LAB — PATHOLOGIST SMEAR REVIEW

## 2018-09-09 LAB — COMPREHENSIVE METABOLIC PANEL
ALT: 134 U/L — ABNORMAL HIGH (ref 0–44)
AST: 322 U/L — ABNORMAL HIGH (ref 15–41)
Albumin: 2 g/dL — ABNORMAL LOW (ref 3.5–5.0)
Alkaline Phosphatase: 50 U/L (ref 38–126)
Anion gap: 7 (ref 5–15)
BUN: 17 mg/dL (ref 8–23)
CO2: 26 mmol/L (ref 22–32)
Calcium: 7.8 mg/dL — ABNORMAL LOW (ref 8.9–10.3)
Chloride: 102 mmol/L (ref 98–111)
Creatinine, Ser: 0.32 mg/dL — ABNORMAL LOW (ref 0.44–1.00)
GFR calc Af Amer: >60 mL/min (ref >60)
GFR calc non Af Amer: >60 mL/min (ref >60)
Glucose, Bld: 157 mg/dL — ABNORMAL HIGH (ref 70–99)
Potassium: 3.7 mmol/L (ref 3.5–5.1)
Sodium: 135 mmol/L (ref 135–145)
Total Bilirubin: 0.7 mg/dL (ref 0.3–1.2)
Total Protein: 6.7 g/dL (ref 6.5–8.1)

## 2018-09-09 LAB — CK: Total CK: 3507 U/L — ABNORMAL HIGH (ref 38–234)

## 2018-09-09 LAB — APTT: aPTT: 56 seconds — ABNORMAL HIGH (ref 24–36)

## 2018-09-09 MED ORDER — ENOXAPARIN SODIUM 40 MG/0.4ML ~~LOC~~ SOLN
40.0000 mg | SUBCUTANEOUS | Status: DC
  Administered 2018-09-09 – 2018-09-22 (×14): 40 mg via SUBCUTANEOUS
  Filled 2018-09-09 (×15): qty 0.4

## 2018-09-09 MED ORDER — HYDROCORTISONE 1 % EX CREA
TOPICAL_CREAM | CUTANEOUS | Status: DC | PRN
  Filled 2018-09-09: qty 28

## 2018-09-09 NOTE — Evaluation (Signed)
Occupational Therapy Evaluation Patient Details Name: DOMINIGUE KENISON MRN: [id number] DOB: 1950-02-14 Today's Date: 09/09/2018    History of Present Illness Pt is a 68 y/o female admitted secondary to dysphagia. Pt with cortrack tube in place. Pt also with likely dermatomyositis and is s/p R upper thigh biopsy. Pt started on IVIG during admission. PMH includes GERD and HLD.    Clinical Impression   Patient presenting with decreased I in self care, balance, functional mobility/transfers, endurance, strengthening, safety awareness. Patient reports being independent 5 weeks ago PTA. Patient currently functioning min - max A. Patient will benefit from acute OT to increase overall independence in the areas of ADLs, functional mobility, and safety awareness in order to safely discharge to next venue of care.    Follow Up Recommendations  Other (comment)(pt is likely transferring to [facility])    Equipment Recommendations  Other (comment)(defer to next venue of care)    Recommendations for Other Services       Precautions / Restrictions Precautions Precautions: Fall;Other (comment) Precaution Comments: cortrack tube Restrictions Weight Bearing Restrictions: No      Mobility Bed Mobility      General bed mobility comments: pt up in recliner chair upon entering the room  Transfers Overall transfer level: Needs assistance Equipment used: 2 person hand held assist Transfers: Sit to/from Stand Sit to Stand: Max assist         General transfer comment: max A for lifting assistance and min A for static standing balance    Balance Overall balance assessment: Needs assistance Sitting-balance support: No upper extremity supported Sitting balance-Leahy Scale: Good     Standing balance support: Bilateral upper extremity supported;During functional activity Standing balance-Leahy Scale: Poor Standing balance comment: Reliant on BUE support        ADL either performed or  assessed with clinical judgement   ADL Overall ADL's : Needs assistance/impaired Eating/Feeding: Minimal assistance;Sitting   Grooming: Wash/dry hands;Wash/dry face;Oral care;Minimal assistance   Upper Body Bathing: Minimal assistance;Sitting   Lower Body Bathing: Sit to/from stand;Maximal assistance   Upper Body Dressing : Minimal assistance;Sitting   Lower Body Dressing: Maximal assistance;Sit to/from stand        Vision Baseline Vision/History: No visual deficits Patient Visual Report: No change from baseline              Pertinent Vitals/Pain Pain Assessment: Faces Faces Pain Scale: No hurt     Hand Dominance Right   Extremity/Trunk Assessment Upper Extremity Assessment Upper Extremity Assessment: RUE deficits/detail;LUE deficits/detail;Generalized weakness RUE Deficits / Details: increased edema and AROM limited secondary to pain LUE Deficits / Details: increased edema and AROM limited secondary to pain   Lower Extremity Assessment Lower Extremity Assessment: Generalized weakness   Cervical / Trunk Assessment Cervical / Trunk Assessment: Kyphotic   Communication Communication Communication: Prefers language other than English;Other (comment)(son present to interpret throughout session)   Cognition Arousal/Alertness: Awake/alert Behavior During Therapy: WFL for tasks assessed/performed Overall Cognitive Status: Within Functional Limits for tasks assessed                Home Living Family/patient expects to be discharged to:: Private residence Living Arrangements: Children;Spouse/significant other Available Help at Discharge: Family Type of Home: House Home Access: Stairs to enter Technical brewer of Steps: 1 Entrance Stairs-Rails: None Home Layout: One level     Bathroom Shower/Tub: Teacher, early years/pre: Standard Bathroom Accessibility: Yes   Home Equipment: Environmental consultant - 2 wheels;Cane - single point  Additional Comments:  family purchased DME because a fellow neighbor was going to throw it away. Son works but lives in  the home. husband works nearby but most days patient is home alone      Prior Functioning/Environment Level of Independence: Independent                 OT Problem List: Decreased activity tolerance;Impaired balance (sitting and/or standing);Decreased safety awareness;Decreased knowledge of precautions;Decreased knowledge of use of DME or AE;Pain;Decreased strength;Increased edema;Decreased range of motion;Impaired UE functional use      OT Treatment/Interventions: Self-care/ADL training;Therapeutic exercise;Neuromuscular education;Energy conservation;DME and/or AE instruction;Manual therapy;Therapeutic activities;Patient/family education;Balance training    OT Goals(Current goals can be found in the care plan section) Acute Rehab OT Goals Patient Stated Goal: to get stronger OT Goal Formulation: With patient/family Time For Goal Achievement: 09/23/18 Potential to Achieve Goals: Good ADL Goals Pt Will Perform Grooming: with supervision Pt Will Perform Upper Body Bathing: with supervision Pt Will Perform Lower Body Bathing: with min guard assist Pt Will Perform Upper Body Dressing: with supervision Pt Will Perform Lower Body Dressing: with min guard assist Pt Will Transfer to Toilet: with min guard assist Pt Will Perform Toileting - Clothing Manipulation and hygiene: with min guard assist  OT Frequency: Min 2X/week   Barriers to D/C: (none known at this time)             AM-PAC OT "6 Clicks" Daily Activity     Outcome Measure Help from another person eating meals?: A Little Help from another person taking care of personal grooming?: A Lot Help from another person toileting, which includes using toliet, bedpan, or urinal?: Total Help from another person bathing (including washing, rinsing, drying)?: A Lot Help from another person to put on and taking off regular upper body  clothing?: A Lot Help from another person to put on and taking off regular lower body clothing?: A Lot 6 Click Score: 12   End of Session    Activity Tolerance: Patient tolerated treatment well Patient left: in chair;with call bell/phone within reach;with chair alarm set  OT Visit Diagnosis: Unsteadiness on feet (R26.81);Muscle weakness (generalized) (M62.81)                Time: [id number] OT Time Calculation (min): 27 min Charges:  OT General Charges $OT Visit: 1 Visit OT Evaluation $OT Eval Moderate Complexity: 1 Mod OT Treatments $Therapeutic Activity: 8-22 mins  Petrona Wyeth P , MS, OTR/L 09/09/2018, 2:40 PM

## 2018-09-09 NOTE — Progress Notes (Signed)
Subjective:   Lisa Mcdowell reports she is doing about the same today. She does not note any improvement in muscle weakness.   Son is at bedside and is updated on current plan. Discussed that bed at [facility] is still not available at that we do get updated daily. All questions and concerns are addressed.   Objective:  Vital signs in last 24 hours: Vitals:   09/08/18 2351 09/09/18 0355 09/09/18 0758 09/09/18 1120  BP: 128/75 126/64 126/69 120/70  Pulse: 74 65 67 78  Resp: 18 18 17 17   Temp: 97.6 F (36.4 C) 97.8 F (36.6 C) 98.3 F (36.8 C) 98.8 F (37.1 C)  TempSrc: Oral Oral Oral Axillary  SpO2: 100% 100% 100% 98%  Weight:      Height:       Physical Exam Vitals signs and nursing note reviewed.  Constitutional:      Appearance: She is normal weight.  Cardiovascular:     Rate and Rhythm: Normal rate and regular rhythm.     Heart sounds: Normal heart sounds. No murmur. No gallop.   Pulmonary:     Effort: Pulmonary effort is normal. No respiratory distress.     Breath sounds: No wheezing.  Skin:    General: Skin is warm and dry.     Comments: Improved peri-ocular rash. Improving bilateral deltoid erythema.   Neurological:     Mental Status: She is alert and oriented to person, place, and time.    Assessment/Plan:  Principal Problem:   Dysphagia Active Problems:   Secondary rhabdomyolysis   Adult type dermatomyositis (HCC)   Transaminitis   Elevated liver enzymes   Thrombocytopenia [facility])  Lisa Mcdowell is a 68 y/o female with PMHx of recently diagnosed dermatomyositis who was admitted for new-onset dysphagia. Prior to admission, she had been prescribed Prednisone 60mg , with which patient was compliant until dysphagia began 2-3 days before presenting to ED.   #Dysphagia, Oropharyngeal #Dermatomyositis Muscle biopsy on 8/20, and pathology results still pending. Completed IVIG treatment. Some improvement in dysphagia. Her CK continue to trend down and is at a all  time low today, indicating IVIG was successful in some sense.   Unfortunately, patient still does not feel symptomatically better. Discussed that results are not instant and will take time. Some aspect of continued weakness may be anxiety related, as patient swallowed for the first time since admission just after IVIG was started, prior to when IVIG effects could be expected. Her symptoms deteriorate when she is upset, i.e. when IVIG had to be paused.   - Solumedrol 40mg  BID - NG Tube feeds  - Biopsy results pending - Telemetry - Vitals signs q4hrs - Still pending [facility] transfer bed availability for formal rheumatology consult.    #Secondary Rhadomyolysis #Elevated LFTs Rheumatology has previously stated that CK will remain elevated for an extended period time before completely returning to normal.  Her CK has reached the 3000s for the first time since initial admission on 07/26.   - Trend LFTs - Trend CK  #Thrombocytopenia:  Thrombocytopenia on 8/22; however, we did not have a CBC since 8/12 so the exact time course is unknown. HIT ruled out by antibodies. No signs of hemolysis and bilirubin remains WNL. LDH was elevated, haptoglobin still pending. Platelets began to improve today to 72 from 54.   - Continue to monitor with daily CBCs - If <50K with bleeding or <10K without bleeding transfuse  - Haptoglobin pending - Pathologist smear review pending  - Switched  back to Lovenox.     Dispo: Accepted for transfer to [facility]. Pending bed availability.   Dr. Jose Persia Internal Medicine PGY-1  Pager: [phone] 09/09/2018, 12:37 PM

## 2018-09-09 NOTE — Progress Notes (Signed)
Pharmacy Heparin Induced Thrombocytopenia (HIT) Note:  Lisa Mcdowell is an 68 y.o. female being evaluated for HIT. HIT has been ruled out based on below Hep Ab  DC Bival and resume enox prophylaxis  Auto-populate labs:  Heparin Induced Plt Ab  Date/Time Value Ref Range Status  09/06/2018 04:37 PM 0.221 0.000 - 0.400 OD Final    Comment:    (NOTE) Performed At: [facility] [place], [place] [id number] Rush Farmer MD [id number]       CALCULATE SCORE:  4Ts (see the HIT Algorithm) Score  Thrombocytopenia 2  Timing 1-2  Thrombosis 0  Other causes of thrombocytopenia 1  Total 4-5     Recommendations (A or B or C) are based on available lab results:  A. No lab results available (HIT antibody and/or SRA ordered): -Moderate HIT probability: Discontinue heparin/LMWH; HIT antibody recommended; SRA may be considered, consider alternative anticoagulation; document heparin allergy  B. HIT antibody result available: -Ruled Out HIT: No SRA needed, continue heparin product, no allergy documentation needed  C. SRA result available -Negative and HIT ruled out: Restart heparin/LMWH; remove heparin allergy if applicable  Name of MD Contacted: Dr Charleen Kirks  Plan (Discussed with provider) Labs ordered: -Heparin antibody Heparin allergy: -documented or updated Anticoagulation plans -continue heparin / LMWH  Levester Fresh, PharmD, BCPS, BCCCP Clinical Pharmacist [phone]  Please check AMION for all [place] numbers  09/09/2018 7:47 AM

## 2018-09-09 NOTE — Progress Notes (Signed)
Spoke with the [facility] transfer center, patient is still on the waitlist. Will call nursing with updates daily.   Lisa Homes, MD  IM[facility] PGY3  Pager: [phone]

## 2018-09-10 LAB — COMPREHENSIVE METABOLIC PANEL
ALT: 128 U/L — ABNORMAL HIGH (ref 0–44)
AST: 277 U/L — ABNORMAL HIGH (ref 15–41)
Albumin: 2 g/dL — ABNORMAL LOW (ref 3.5–5.0)
Alkaline Phosphatase: 55 U/L (ref 38–126)
Anion gap: 7 (ref 5–15)
BUN: 19 mg/dL (ref 8–23)
CO2: 27 mmol/L (ref 22–32)
Calcium: 8 mg/dL — ABNORMAL LOW (ref 8.9–10.3)
Chloride: 99 mmol/L (ref 98–111)
Creatinine, Ser: 0.37 mg/dL — ABNORMAL LOW (ref 0.44–1.00)
GFR calc Af Amer: >60 mL/min (ref >60)
GFR calc non Af Amer: >60 mL/min (ref >60)
Glucose, Bld: 187 mg/dL — ABNORMAL HIGH (ref 70–99)
Potassium: 4 mmol/L (ref 3.5–5.1)
Sodium: 133 mmol/L — ABNORMAL LOW (ref 135–145)
Total Bilirubin: 0.8 mg/dL (ref 0.3–1.2)
Total Protein: 6.4 g/dL — ABNORMAL LOW (ref 6.5–8.1)

## 2018-09-10 LAB — CBC
HCT: 33.7 % — ABNORMAL LOW (ref 36.0–46.0)
Hemoglobin: 11.1 g/dL — ABNORMAL LOW (ref 12.0–15.0)
MCH: 32.7 pg (ref 26.0–34.0)
MCHC: 32.9 g/dL (ref 30.0–36.0)
MCV: 99.4 fL (ref 80.0–100.0)
Platelets: 93 10*3/uL — ABNORMAL LOW (ref 150–400)
RBC: 3.39 MIL/uL — ABNORMAL LOW (ref 3.87–5.11)
RDW: 13.6 % (ref 11.5–15.5)
WBC: 6.6 10*3/uL (ref 4.0–10.5)
nRBC: 0 % (ref 0.0–0.2)

## 2018-09-10 LAB — GLUCOSE, CAPILLARY
Glucose-Capillary: 122 mg/dL — ABNORMAL HIGH (ref 70–99)
Glucose-Capillary: 162 mg/dL — ABNORMAL HIGH (ref 70–99)
Glucose-Capillary: 166 mg/dL — ABNORMAL HIGH (ref 70–99)
Glucose-Capillary: 181 mg/dL — ABNORMAL HIGH (ref 70–99)
Glucose-Capillary: 198 mg/dL — ABNORMAL HIGH (ref 70–99)
Glucose-Capillary: 214 mg/dL — ABNORMAL HIGH (ref 70–99)
Glucose-Capillary: 215 mg/dL — ABNORMAL HIGH (ref 70–99)

## 2018-09-10 LAB — CK: Total CK: 2808 U/L — ABNORMAL HIGH (ref 38–234)

## 2018-09-10 LAB — HAPTOGLOBIN: Haptoglobin: 271 mg/dL (ref 37–355)

## 2018-09-10 LAB — APTT: aPTT: 28 seconds (ref 24–36)

## 2018-09-10 MED ORDER — HYDROXYZINE HCL 10 MG/5ML PO SYRP
25.0000 mg | ORAL_SOLUTION | Freq: Once | ORAL | Status: AC | PRN
  Administered 2018-09-10: 25 mg
  Filled 2018-09-10: qty 12.5

## 2018-09-10 MED ORDER — RAMELTEON 8 MG PO TABS
8.0000 mg | ORAL_TABLET | Freq: Every day | ORAL | Status: DC
  Administered 2018-09-10: 8 mg via ORAL
  Filled 2018-09-10: qty 1

## 2018-09-10 MED ORDER — LACTULOSE 10 GM/15ML PO SOLN
20.0000 g | Freq: Every day | ORAL | Status: DC | PRN
  Administered 2018-09-10: 20 g via ORAL
  Filled 2018-09-10 (×3): qty 30

## 2018-09-10 NOTE — Progress Notes (Signed)
   Subjective:   Lisa Mcdowell is doing well today. Her daughter, [name], is at bedside. PT is also in the room preparing to work with the patient. [name] noted big improvements in upper extremity swelling and redness. Both she and the patient find this reassuring.   Objective:  Vital signs in last 24 hours: Vitals:   09/09/18 1618 09/09/18 2011 09/09/18 2349 09/10/18 0427  BP: 122/77 121/69 123/71 118/68  Pulse: 79 89 77 74  Resp: 16 19 18 17   Temp: 98.7 F (37.1 C) 98 F (36.7 C) 97.7 F (36.5 C) 98.1 F (36.7 C)  TempSrc: Oral Oral Oral Oral  SpO2: 100% 100% 97% 98%  Weight:      Height:       Physical Exam Vitals signs and nursing note reviewed.  Constitutional:      General: She is not in acute distress.    Appearance: She is obese. She is not toxic-appearing.  Musculoskeletal:     Comments: Improved upper extremity swelling.   Skin:    Findings: Erythema (LUE, RUE - minimal today) present.  Neurological:     Mental Status: She is alert and oriented to person, place, and time. Mental status is at baseline.     Motor: Weakness (Continued upper extremity weakness, but slightly improved from yesterday's exam in ROM) present.    Assessment/Plan:  Principal Problem:   Dysphagia Active Problems:   Secondary rhabdomyolysis   Adult type dermatomyositis (HCC)   Transaminitis   Elevated liver enzymes   Thrombocytopenia (HCC)  #Dysphagia, Oropharyngeal #Dermatomyositis Muscle biopsy on 8/20, and pathology results still pending. Completed IVIG treatment.   Given decrease in CK and AST since completing IVIG, patient should be starting to see some improvement with weakness, including dysphagia. SLP is coming by today and will follow up their progress.   - Solumedrol 40mg  BID - NG Tube feeds  - Biopsy results pending - Telemetry - Vitals signs q4hrs - Still pending [facility] transfer bed availability for formal rheumatology consult.   #Secondary Rhadomyolysis  #Elevated LFTs Rheumatology has previously stated that CK will remain elevated for an extended period time before completely returning to normal. Lowest CK recorded today at 2808. AST is also the lowest today. Likely IVIG related.   - Trend LFTs - Trend CK  #Thrombocytopenia:  Continues to improve for the 3rd day in a row. Pathologist smear was unrevealing for signs of hemolysis or platelet dysfunction.   - Continue to monitor with daily CBCs - Haptoglobin pending - Lovenox   Dispo: Anticipated discharge pending transfer to [facility]. They have not called yet today.   Dr. Jose Persia Internal Medicine PGY-1  Pager: [phone] 09/10/2018, 6:56 AM

## 2018-09-10 NOTE — Progress Notes (Signed)
Physical Therapy Treatment Patient Details Name: Lisa Mcdowell MRN: [id number] DOB: Apr 27, 1950 Today's Date: 09/10/2018    History of Present Illness Pt is a 68 y/o female admitted secondary to dysphagia. Pt with cortrack tube in place. Pt also with likely dermatomyositis and is s/p R upper thigh biopsy. Pt started on IVIG during admission. PMH includes GERD and HLD.     PT Comments    Patient seen for mobility progression. Pt is making progress toward PT goals and tolerated increased gait training distance of 60 ft X 2 trials this session.  Pt requires mod/max A +2 for sit to stand transfers and min A +2 for ambulation. Continue to progress as tolerated.   Follow Up Recommendations  Other (comment)(TBD pending transfer likely to [facility])     Equipment Recommendations  Other (comment)(TBD)    Recommendations for Other Services       Precautions / Restrictions Precautions Precautions: Fall;Other (comment) Precaution Comments: cortrack tube Restrictions Weight Bearing Restrictions: No    Mobility  Bed Mobility               General bed mobility comments: Pt OOB in chair upon arrival   Transfers Overall transfer level: Needs assistance Equipment used: 2 person hand held assist;Rolling walker (2 wheeled) Transfers: Sit to/from Stand Sit to Stand: Max assist;Mod assist;+2 physical assistance         General transfer comment: initial stand pt required max A +2 to power up and then mod A +2 second trial using momentum to power up into standing; cues for safe hand placement and positioning prior to standing   Ambulation/Gait Ambulation/Gait assistance: +2 safety/equipment;Min assist Gait Distance (Feet): (60 ft X 2 with seated rest break) Assistive device: Rolling walker (2 wheeled) Gait Pattern/deviations: Step-through pattern;Decreased stride length;Trunk flexed Gait velocity: decreased   General Gait Details: cues for upright posture and sequencing; heavy reliance  on UE support; bilat LE weakness and incoordination    Stairs             Wheelchair Mobility    Modified Rankin (Stroke Patients Only)       Balance Overall balance assessment: Needs assistance Sitting-balance support: No upper extremity supported Sitting balance-Leahy Scale: Fair     Standing balance support: Bilateral upper extremity supported;During functional activity Standing balance-Leahy Scale: Poor Standing balance comment: Reliant on BUE support                             Cognition Arousal/Alertness: Awake/alert Behavior During Therapy: WFL for tasks assessed/performed Overall Cognitive Status: Within Functional Limits for tasks assessed                                 General Comments: appears WFL for mobility tasks during session      Exercises      General Comments        Pertinent Vitals/Pain Pain Assessment: Faces Faces Pain Scale: Hurts a little bit Pain Location: bilat UE Pain Descriptors / Indicators: Sore Pain Intervention(s): Monitored during session    Home Living                      Prior Function            PT Goals (current goals can now be found in the care plan section) Acute Rehab PT Goals Patient Stated Goal: to get  stronger Progress towards PT goals: Progressing toward goals    Frequency    Min 2X/week      PT Plan Current plan remains appropriate    Co-evaluation              AM-PAC PT "6 Clicks" Mobility   Outcome Measure  Help needed turning from your back to your side while in a flat bed without using bedrails?: A Little Help needed moving from lying on your back to sitting on the side of a flat bed without using bedrails?: A Little Help needed moving to and from a bed to a chair (including a wheelchair)?: A Lot Help needed standing up from a chair using your arms (e.g., wheelchair or bedside chair)?: A Lot Help needed to walk in hospital room?: A Lot Help  needed climbing 3-5 steps with a railing? : A Lot 6 Click Score: 14    End of Session Equipment Utilized During Treatment: Gait belt Activity Tolerance: Patient tolerated treatment well Patient left: in chair;with call bell/phone within reach;with family/visitor present Nurse Communication: Mobility status PT Visit Diagnosis: Unsteadiness on feet (R26.81);Muscle weakness (generalized) (M62.81);Difficulty in walking, not elsewhere classified (R26.2)     Time: [id number] PT Time Calculation (min) (ACUTE ONLY): 26 min  Charges:  $Gait Training: 23-37 mins                     Earney Navy, PTA Acute Rehabilitation Services Pager: [phone] Office: [phone]     Darliss Cheney 09/10/2018, 3:10 PM

## 2018-09-10 NOTE — Progress Notes (Signed)
  Speech Language Pathology Treatment: Dysphagia  Patient Details Name: Lisa Mcdowell MRN: [id number] DOB: 20-Jul-1950 Today's Date: 09/10/2018 Time: [id number] SLP Time Calculation (min) (ACUTE ONLY): 14 min  Assessment / Plan / Recommendation Clinical Impression  Ongoing f/u for dysphagia.  Daughter, Lisa Mcdowell, present and both she and her mother are pleased with PT session this morning.  Lisa Mcdowell states she has not been eating ice chips or drinking sips of water as we discussed in prior session - she acknowledges that her choking experience at home, PTA, created a lot of fear around swallowing.  We discussed that a half teaspoon of water and single ice chips (1 ml water) pose very little risk to her lungs, and I encouraged her to try a few tiny sips each day so that she can determine improvements. Provided encouragement and active listening; we discussed that daily attempts at ice chips/water may help to abate her fear of swallowing, and acknowledged that we don't want her fear to grow larger than the actual dysphagia itself.  She verbalized understanding and agreement through Lisa Mcdowell' translation. Pt rated the effort required to swallow as a 5 on 1-10 scale of difficulty.  She acknowledged that overall, her swallowing is improving marginally. SLP will continue to follow along for education and advanced POs when appropriate.   HPI HPI: This is a 68 year old female with a PMH significant for dermatomysitis which appears to be a fairly new diagnosis for her. Patient was recently hospitalized for 7/26-7/31 for dermatomyositis and was subsequently treated with IV steroids.  She is discharged with p.o. prednisone.  Patient presented to the ED with a 4 day h/o progressively worsening dysphagia characterized by globus.       SLP Plan  Continue with current plan of care       Recommendations  Diet recommendations: Other(comment)(sips and chips) Liquids provided via: Teaspoon Medication  Administration: Via alternative means Supervision: Patient able to self feed                Oral Care Recommendations: Oral care QID SLP Visit Diagnosis: Dysphagia, oropharyngeal phase (R13.12) Plan: Continue with current plan of care       GO              Lisa Mcdowell L. Tivis Ringer, [place] Office number [phone] Pager [phone]   Lisa Mcdowell 09/10/2018, 12:14 PM

## 2018-09-11 DIAGNOSIS — G4700 Insomnia, unspecified: Secondary | ICD-10-CM

## 2018-09-11 LAB — GLUCOSE, CAPILLARY
Glucose-Capillary: 142 mg/dL — ABNORMAL HIGH (ref 70–99)
Glucose-Capillary: 157 mg/dL — ABNORMAL HIGH (ref 70–99)
Glucose-Capillary: 177 mg/dL — ABNORMAL HIGH (ref 70–99)
Glucose-Capillary: 122 mg/dL — ABNORMAL HIGH (ref 70–99)
Glucose-Capillary: 154 mg/dL — ABNORMAL HIGH (ref 70–99)

## 2018-09-11 LAB — COMPREHENSIVE METABOLIC PANEL
ALT: 121 U/L — ABNORMAL HIGH (ref 0–44)
AST: 226 U/L — ABNORMAL HIGH (ref 15–41)
Albumin: 2 g/dL — ABNORMAL LOW (ref 3.5–5.0)
Alkaline Phosphatase: 50 U/L (ref 38–126)
Anion gap: 8 (ref 5–15)
BUN: 23 mg/dL (ref 8–23)
CO2: 26 mmol/L (ref 22–32)
Calcium: 7.9 mg/dL — ABNORMAL LOW (ref 8.9–10.3)
Chloride: 100 mmol/L (ref 98–111)
Creatinine, Ser: 0.32 mg/dL — ABNORMAL LOW (ref 0.44–1.00)
GFR calc Af Amer: >60 mL/min (ref >60)
GFR calc non Af Amer: >60 mL/min (ref >60)
Glucose, Bld: 143 mg/dL — ABNORMAL HIGH (ref 70–99)
Potassium: 3.7 mmol/L (ref 3.5–5.1)
Sodium: 134 mmol/L — ABNORMAL LOW (ref 135–145)
Total Bilirubin: 0.9 mg/dL (ref 0.3–1.2)
Total Protein: 5.9 g/dL — ABNORMAL LOW (ref 6.5–8.1)

## 2018-09-11 LAB — CBC
HCT: 31 % — ABNORMAL LOW (ref 36.0–46.0)
Hemoglobin: 10.2 g/dL — ABNORMAL LOW (ref 12.0–15.0)
MCH: 33 pg (ref 26.0–34.0)
MCHC: 32.9 g/dL (ref 30.0–36.0)
MCV: 100.3 fL — ABNORMAL HIGH (ref 80.0–100.0)
Platelets: 97 10*3/uL — ABNORMAL LOW (ref 150–400)
RBC: 3.09 MIL/uL — ABNORMAL LOW (ref 3.87–5.11)
RDW: 14 % (ref 11.5–15.5)
WBC: 6.3 10*3/uL (ref 4.0–10.5)
nRBC: 0 % (ref 0.0–0.2)

## 2018-09-11 LAB — CK: Total CK: 2383 U/L — ABNORMAL HIGH (ref 38–234)

## 2018-09-11 LAB — APTT: aPTT: 26 seconds (ref 24–36)

## 2018-09-11 MED ORDER — INSULIN ASPART 100 UNIT/ML ~~LOC~~ SOLN
0.0000 [IU] | Freq: Every day | SUBCUTANEOUS | Status: DC
  Administered 2018-09-11: 01:00:00 2 [IU] via SUBCUTANEOUS

## 2018-09-11 MED ORDER — HYDROXYZINE HCL 10 MG/5ML PO SYRP: 25.0000 mg | ORAL_SOLUTION | Freq: Once | ORAL | Status: DC | PRN

## 2018-09-11 MED ORDER — HYDROXYZINE HCL 25 MG PO TABS
25.0000 mg | ORAL_TABLET | Freq: Every evening | ORAL | Status: DC | PRN
  Administered 2018-09-11 – 2018-09-12 (×2): 25 mg
  Filled 2018-09-11 (×2): qty 1

## 2018-09-11 MED ORDER — INSULIN ASPART 100 UNIT/ML ~~LOC~~ SOLN
0.0000 [IU] | Freq: Three times a day (TID) | SUBCUTANEOUS | Status: DC
  Administered 2018-09-11 (×2): 2 [IU] via SUBCUTANEOUS
  Administered 2018-09-11: 1 [IU] via SUBCUTANEOUS
  Administered 2018-09-12 (×3): 2 [IU] via SUBCUTANEOUS
  Administered 2018-09-13: 5 [IU] via SUBCUTANEOUS
  Administered 2018-09-13: 16:00:00 2 [IU] via SUBCUTANEOUS
  Administered 2018-09-14: 3 [IU] via SUBCUTANEOUS
  Administered 2018-09-14: 2 [IU] via SUBCUTANEOUS
  Administered 2018-09-15: 5 [IU] via SUBCUTANEOUS
  Administered 2018-09-15: 1 [IU] via SUBCUTANEOUS
  Administered 2018-09-16 (×2): 3 [IU] via SUBCUTANEOUS
  Administered 2018-09-17: 2 [IU] via SUBCUTANEOUS
  Administered 2018-09-17: 13:00:00 1 [IU] via SUBCUTANEOUS
  Administered 2018-09-18 – 2018-09-19 (×3): 2 [IU] via SUBCUTANEOUS
  Administered 2018-09-19: 1 [IU] via SUBCUTANEOUS
  Administered 2018-09-20: 12:00:00 2 [IU] via SUBCUTANEOUS
  Administered 2018-09-20 – 2018-09-21 (×2): 3 [IU] via SUBCUTANEOUS
  Administered 2018-09-21 – 2018-09-22 (×2): 5 [IU] via SUBCUTANEOUS

## 2018-09-11 NOTE — Progress Notes (Addendum)
Subjective: Patient seen at the bedside on rounds this morning with the nurse who speaks Spanish. Sitting up in chair at the bedside.  Patient says she feels hungry this morning but is nervous about swallowing.  She is work working on her swallowing with ice chips. Says she is very sad and her family is very sad about her being in the hospital. Otherwise patient is feeling okay. No other complaints.   Objective:  Vital signs in last 24 hours: Vitals:   09/10/18 1932 09/10/18 2352 09/11/18 0435 09/11/18 0812  BP: 117/81 109/74 (!) 142/77 (P) 112/66  Pulse: 88 74 72 (P) 84  Resp: 18 18 18  (P) 18  Temp: 97.7 F (36.5 C) 97.8 F (36.6 C) 97.7 F (36.5 C) (P) 98.2 F (36.8 C)  TempSrc: Oral Oral Oral (P) Oral  SpO2: 99% 99% 99% (P) 98%  Weight:      Height:       Physical Exam Vitals signs reviewed.  Constitutional:      General: She is not in acute distress.    Appearance: She is obese. She is not ill-appearing or toxic-appearing.  HENT:     Head: Normocephalic and atraumatic.     Nose:     Comments: NG tube in place     Mouth/Throat:     Mouth: Mucous membranes are dry.  Eyes:     Extraocular Movements: Extraocular movements intact.     Comments: teary  Cardiovascular:     Rate and Rhythm: Normal rate and regular rhythm.     Pulses: Normal pulses.     Heart sounds: Normal heart sounds. No murmur. No friction rub. No gallop.   Pulmonary:     Effort: Pulmonary effort is normal. No respiratory distress.     Breath sounds: Normal breath sounds. No wheezing or rales.  Abdominal:     General: Abdomen is flat. Bowel sounds are normal. There is no distension.     Palpations: Abdomen is soft.     Tenderness: There is no abdominal tenderness. There is no guarding.  Musculoskeletal: Normal range of motion.  Skin:    Coloration: Skin is not jaundiced.     Findings: No rash.  Neurological:     General: No focal deficit present.     Mental Status: She is alert and oriented to  person, place, and time.  Psychiatric:     Comments: Sad mood    Assessment/Plan:  Principal Problem:   Dysphagia Active Problems:   Secondary rhabdomyolysis   Adult type dermatomyositis (HCC)   Transaminitis   Elevated liver enzymes   Thrombocytopenia (HCC)  #Dysphagia, Oropharyngeal #Dermatomyositis Muscle biopsy on 8/20, and pathologyresults still pending. Completed IVIG treatment.  AST, ALT and CK have decreased to day to 226,121, and 2383 today from 77,128, and 2808 respectively. I expect the patient should be starting to see some improvement with weakness, including dysphagia. SLP is coming by today and will follow up their progress.  - Solumedrol 40mg  BID - NG Tube feeds  - Biopsy results pending - Telemetry - Vitals signs q4hrs - Still pending [facility] transfer bed availability for formal rheumatology consult.   #Secondary Rhadomyolysis #Elevated LFTs Rheumatology has previously stated that CK will remain elevated for an extended period time before completely returning to normal. Lowest CK recorded today at 2383. AST is also the lowest today at 226.  - Continue trending LFTs & CK  #Thrombocytopenia: Continues to improve. 97 today from 93 and 72 on the days  prior. Pathologist smear was unrevealing for signs of hemolysis or platelet dysfunction. Haptoglobin negative  - Daily CBCs  #DVT Prophylaxis - Lovenox  #Diet - Follow SLP recs  Dispo: Anticipated discharge pending transfer to [facility]. Per nursing, they do not have any beds available as of today.   Earlene Plater, MD Internal Medicine, PGY1 Pager: [phone]  09/11/2018,9:41 AM

## 2018-09-11 NOTE — Progress Notes (Signed)
Nutrition Follow-up  DOCUMENTATION CODES:   Not applicable  INTERVENTION:  Continue Jevity 1.2 formula via Cortrak NGT at goal rate of 55 ml/hr.  30 ml Prostat BID.    Free water flushes per MD of 400 ml every 8 hours. (MD to adjust as appropriate)  Tube feeding regimen provides 1784 kcal (100% of needs), 103 grams of protein, and 2269 ml of H2O.   NUTRITION DIAGNOSIS:   Inadequate oral intake related to dysphagia as evidenced by NPO status; ongoing  GOAL:   Patient will meet greater than or equal to 90% of their needs; met with TF  MONITOR:   TF tolerance, Diet advancement, Weight trends, Labs, I & O's, Skin  REASON FOR ASSESSMENT:   Consult Enteral/tube feeding initiation and management  ASSESSMENT:   68 year old female with recently diagnosed dermatomyositis, now with dysphagia to solids and liquids. Cortrak NGT replaced 8/17.  Tip of tube in stomach.   Procedure (8/20): Right thigh muscle biopsy of the vastus lateralis complex due to right thigh weakness.   Pending biopsy results. Pt has completed IVIG treatment. Pt continues to work with SLP on dysphagia and swallowing. Pt continues on NPO status. Pt has been tolerating her tube feeds well. RD to continue with current orders. Pending transfer to [facility].   Labs and medications reviewed.   Diet Order:   Diet Order            Diet NPO time specified Except for: Ice Chips  Diet effective now              EDUCATION NEEDS:   Not appropriate for education at this time  Skin:  Skin Assessment: Reviewed RN Assessment  Last BM:  8/27  Height:   Ht Readings from Last 1 Encounters:  09/04/18 5' 5.98" (1.676 m)    Weight:   Wt Readings from Last 1 Encounters:  09/07/18 75 kg    Ideal Body Weight:  59 kg  BMI:  Body mass index is 26.7 kg/m.  Estimated Nutritional Needs:   Kcal:  9518-8416  Protein:  85-100 grams  Fluid:  1.7 - 1.9 L/day    Corrin Parker, MS, RD, LDN Pager #  [phone] After hours/ weekend pager # [phone]

## 2018-09-11 NOTE — Progress Notes (Signed)
Occupational Therapy Treatment Patient Details Name: Lisa Mcdowell MRN: [id number] DOB: [birth year] Today's Date: 09/11/2018    History of present illness Pt is a 68 y/o female admitted secondary to dysphagia. Pt with cortrack tube in place. Pt also with likely dermatomyositis and is s/p R upper thigh biopsy. Pt started on IVIG during admission. PMH includes GERD and HLD.    OT comments  Pt's daughter present in room to translate this session. Pt reports feeling much weaker and c/o stomach pain with constipation. Pt agreeable to OT intervention with encouragement. Pt requesting to work on sit <>Stand. Much more effort needed this session with multiple attempts made and pt able to stand only one time with total A to power up into standing. Pt able to stand for only 30 seconds before sitting and appeared very fatigued. Pt would continue to benefit from OT intervention.   Follow Up Recommendations  Other (comment)(awaiting transfer to [facility])    Equipment Recommendations  Other (comment)(defer to next venue of care)       Precautions / Restrictions Precautions Precautions: Fall;Other (comment) Precaution Comments: cortrack tube       Mobility Bed Mobility        General bed mobility comments: Pt OOB in chair upon arrival   Transfers Overall transfer level: Needs assistance   Transfers: Sit to/from Stand Sit to Stand: Total assist         General transfer comment: multiple attempts made to come into standing and on 3rd attempt to standing with total A. Once in standing she is able to maintain with mod A for 30 seconds.    Balance Overall balance assessment: Needs assistance Sitting-balance support: No upper extremity supported Sitting balance-Leahy Scale: Fair     Standing balance support: Bilateral upper extremity supported;During functional activity Standing balance-Leahy Scale: Poor Standing balance comment: Reliant on BUE support          ADL either performed or  assessed with clinical judgement                  Cognition Arousal/Alertness: Awake/alert Behavior During Therapy: WFL for tasks assessed/performed Overall Cognitive Status: Within Functional Limits for tasks assessed       General Comments: appears WFL for mobility tasks during session                   Pertinent Vitals/ Pain       Pain Assessment: Faces Faces Pain Scale: Hurts even more Pain Location: stomach Pain Descriptors / Indicators: Discomfort Pain Intervention(s): Monitored during session         Frequency  Min 2X/week        Progress Toward Goals  OT Goals(current goals can now be found in the care plan section)  Progress towards OT goals: Progressing toward goals  Acute Rehab OT Goals Patient Stated Goal: to get stronger OT Goal Formulation: With patient/family Time For Goal Achievement: 09/25/18 Potential to Achieve Goals: Good  Plan Discharge plan remains appropriate       AM-PAC OT "6 Clicks" Daily Activity     Outcome Measure   Help from another person eating meals?: A Little Help from another person taking care of personal grooming?: A Lot Help from another person toileting, which includes using toliet, bedpan, or urinal?: Total Help from another person bathing (including washing, rinsing, drying)?: A Lot Help from another person to put on and taking off regular upper body clothing?: A Lot Help from another person to put on and  taking off regular lower body clothing?: Total 6 Click Score: 11    End of Session    OT Visit Diagnosis: Unsteadiness on feet (R26.81);Muscle weakness (generalized) (M62.81)   Activity Tolerance Patient limited by fatigue   Patient Left in chair;with call bell/phone within reach;with chair alarm set;with family/visitor present           Time: 1400-1415 OT Time Calculation (min): 15 min  Charges: OT General Charges $OT Visit: 1 Visit OT Treatments $Therapeutic Activity: 8-22  mins   Princella Jaskiewicz P, MS, OTR/L 09/11/2018, 3:26 PM

## 2018-09-12 DIAGNOSIS — D649 Anemia, unspecified: Secondary | ICD-10-CM

## 2018-09-12 LAB — COMPREHENSIVE METABOLIC PANEL
ALT: 114 U/L — ABNORMAL HIGH (ref 0–44)
AST: 198 U/L — ABNORMAL HIGH (ref 15–41)
Albumin: 2.1 g/dL — ABNORMAL LOW (ref 3.5–5.0)
Alkaline Phosphatase: 51 U/L (ref 38–126)
Anion gap: 9 (ref 5–15)
BUN: 16 mg/dL (ref 8–23)
CO2: 26 mmol/L (ref 22–32)
Calcium: 8 mg/dL — ABNORMAL LOW (ref 8.9–10.3)
Chloride: 100 mmol/L (ref 98–111)
Creatinine, Ser: 0.38 mg/dL — ABNORMAL LOW (ref 0.44–1.00)
GFR calc Af Amer: >60 mL/min (ref >60)
GFR calc non Af Amer: >60 mL/min (ref >60)
Glucose, Bld: 163 mg/dL — ABNORMAL HIGH (ref 70–99)
Potassium: 4.1 mmol/L (ref 3.5–5.1)
Sodium: 135 mmol/L (ref 135–145)
Total Bilirubin: 0.6 mg/dL (ref 0.3–1.2)
Total Protein: 5.6 g/dL — ABNORMAL LOW (ref 6.5–8.1)

## 2018-09-12 LAB — CK: Total CK: 1708 U/L — ABNORMAL HIGH (ref 38–234)

## 2018-09-12 LAB — GLUCOSE, CAPILLARY
Glucose-Capillary: 155 mg/dL — ABNORMAL HIGH (ref 70–99)
Glucose-Capillary: 166 mg/dL — ABNORMAL HIGH (ref 70–99)
Glucose-Capillary: 166 mg/dL — ABNORMAL HIGH (ref 70–99)
Glucose-Capillary: 171 mg/dL — ABNORMAL HIGH (ref 70–99)
Glucose-Capillary: 175 mg/dL — ABNORMAL HIGH (ref 70–99)
Glucose-Capillary: 194 mg/dL — ABNORMAL HIGH (ref 70–99)

## 2018-09-12 LAB — APTT: aPTT: 25 seconds (ref 24–36)

## 2018-09-12 LAB — CBC
HCT: 29.4 % — ABNORMAL LOW (ref 36.0–46.0)
Hemoglobin: 9.9 g/dL — ABNORMAL LOW (ref 12.0–15.0)
MCH: 33 pg (ref 26.0–34.0)
MCHC: 33.7 g/dL (ref 30.0–36.0)
MCV: 98 fL (ref 80.0–100.0)
Platelets: 101 10*3/uL — ABNORMAL LOW (ref 150–400)
RBC: 3 MIL/uL — ABNORMAL LOW (ref 3.87–5.11)
RDW: 14 % (ref 11.5–15.5)
WBC: 9.1 10*3/uL (ref 4.0–10.5)
nRBC: 0 % (ref 0.0–0.2)

## 2018-09-12 MED ORDER — PRO-STAT SUGAR FREE PO LIQD
30.0000 mL | Freq: Two times a day (BID) | ORAL | Status: DC
  Administered 2018-09-12 – 2018-09-15 (×6): 30 mL
  Filled 2018-09-12 (×6): qty 30

## 2018-09-12 MED ORDER — PANTOPRAZOLE SODIUM 40 MG PO PACK
40.0000 mg | PACK | Freq: Two times a day (BID) | ORAL | Status: DC
  Administered 2018-09-12 – 2018-09-16 (×8): 40 mg
  Filled 2018-09-12 (×8): qty 20

## 2018-09-12 NOTE — Progress Notes (Signed)
Subjective:   Lisa Mcdowell reports she is doing okay today.  She has been working hard on practicing swallowing.  She showed [place] today how she can swallow a teaspoon of water at a time now.  Otherwise, she is working on her muscle strengths by doing exercises in her chair. Daughter at besides requests we change protein Prostat added to tube feeds to BID only as it makes her have mucus build up in her throat. Patient endorses some intermittent cramping abdominal pain but attributes it to her having a BM yesterday after a bout of constipation.    We discussed that so far, [facility] continues to not have any beds available. Family is discouraged by how long transfer is taking. All questions and concerns addressed.   Objective:  Vital signs in last 24 hours: Vitals:   09/11/18 2012 09/12/18 0005 09/12/18 0330 09/12/18 0424  BP: (!) 117/59 (!) 106/59 108/77   Pulse: 72 72 76   Resp: 16 20 16    Temp: 97.7 F (36.5 C) 97.6 F (36.4 C) 97.8 F (36.6 C)   TempSrc: Oral Oral Oral   SpO2: 100% 98% 100%   Weight:    74.8 kg  Height:       Physical Exam Vitals signs reviewed.  Constitutional:      General: She is not in acute distress.    Appearance: She is obese. She is not ill-appearing or toxic-appearing.  HENT:     Head: Normocephalic and atraumatic.     Nose:     Comments: NG tube in place    Mouth/Throat:     Mouth: Mucous membranes are moist.  Cardiovascular:     Rate and Rhythm: Normal rate and regular rhythm.     Pulses: Normal pulses.     Heart sounds: Normal heart sounds. No murmur. No friction rub. No gallop.   Pulmonary:     Effort: Pulmonary effort is normal. No respiratory distress.     Breath sounds: Normal breath sounds.  Abdominal:     General: Abdomen is flat. Bowel sounds are normal. There is no distension.     Palpations: Abdomen is soft.     Tenderness: There is no abdominal tenderness. There is no guarding.  Musculoskeletal: Normal range of motion.  Skin:  General: Skin is warm and dry.  Neurological:     General: No focal deficit present.     Mental Status: She is alert and oriented to person, place, and time.    Assessment/Plan:  Principal Problem:   Dysphagia Active Problems:   Secondary rhabdomyolysis   Adult type dermatomyositis (HCC)   Transaminitis   Elevated liver enzymes   Thrombocytopenia (HCC)  #Dysphagia, Oropharyngeal #Dermatomyositis Muscle biopsy on 8/20, and pathologyresults still pending. Completed IVIG treatment.  CK and AST continue to trend down, and will likely within a few days be within normal limits.  Discussed with patient and her daughter that muscle weakness and dysphasia should begin to be showing more improvement than prior.  SLP visited patient after our morning visit, noted that she did excellent today.  She was able to even swallow pure.  SLP started patient on a full liquid diet.  If she tolerates, plan to remove core track.  - Solumedrol 40mg  BID - NG Tube feeds  - Biopsy results pending - Telemetry - Vitals signs q4hrs - Still pending [facility] transfer bed availability for formal rheumatology consult.   #Secondary Rhadomyolysis #Elevated LFTs Rheumatology has previously stated that CK will remain elevated  for an extended period time before completely returning to normal. CK and AST continue to trend down.  - Continue trending LFTs & CK  # Anemia:  Hemoglobin was slowly trended down during this admission and today is 9.9. MCV fluctuates between macrocytic and normocytic. Given her oral intake has been poor due to developing medical issues and hospitalizations, will check for vitamin deficiency.   - B12 and folate tomorrow AM  #Thrombocytopenia: Improving.  - Daily CBCs  #DVT Prophylaxis - Lovenox  #Diet - Follow SLP and dietician's recs   Dispo: Anticipated discharge pending transfer to [facility]. Per nursing, they do not have any beds available as of today.    Dr. Jose Persia  Internal Medicine PGY-1  Pager: [phone] 09/12/2018, 6:48 AM

## 2018-09-12 NOTE — Progress Notes (Signed)
  Speech Language Pathology Treatment: Dysphagia  Patient Details Name: Lisa Mcdowell MRN: [id number] DOB: [birth year] Today's Date: 09/12/2018 Time: 1140-1230 SLP Time Calculation (min) (ACUTE ONLY): 50 min  Assessment / Plan / Recommendation Clinical Impression  Excellent session today, pt has tolerated teaspoon sips of water all morning without coughing. Able to progress pt from teaspoon sips to cup and straw sips of water as well as bite of puree. Distraction/conversation is very helpful; pt is anxious and slightly obsessive per daughter "she thinks constantly about some small unimportant things and cant stop." She is also very social and generous and was very engaged in conversation. Pt was able to distract herself from anxiety by chatting and SLP offered occasional bites and sips without pt thining about her fear. The inflammation in her throat causing residue and coughing seems to have improved as there were no immediate signs of aspiration. Initiated a full liquid diet and ordered pt some soup and assigned her to try different kinds of liquids and purees today without any pressure. If intake improves can consider Cortrak removal.   HPI HPI: This is a 68 year old female with a PMH significant for dermatomysitis which appears to be a fairly new diagnosis for her. Patient was recently hospitalized for 7/26-7/31 for dermatomyositis and was subsequently treated with IV steroids.  She is discharged with p.o. prednisone.  Patient presented to the ED with a 4 day h/o progressively worsening dysphagia characterized by globus.       SLP Plan  Continue with current plan of care       Recommendations  Diet recommendations: Dysphagia 1 (puree);Thin liquid Liquids provided via: Teaspoon;Cup;Straw Medication Administration: Via alternative means Supervision: Patient able to self feed                Follow up Recommendations: 24 hour supervision/assistance SLP Visit Diagnosis: Dysphagia,  oropharyngeal phase (R13.12) Plan: Continue with current plan of care       GO               Lisa [place], MA [place] Pager [phone] Office [phone]  Lisa Mcdowell 09/12/2018, 12:55 PM

## 2018-09-12 NOTE — Plan of Care (Signed)
  Problem: Activity: Goal: Risk for activity intolerance will decrease Outcome: Progressing   

## 2018-09-13 LAB — IRON AND TIBC
Iron: 83 ug/dL (ref 28–170)
Saturation Ratios: 37 % — ABNORMAL HIGH (ref 10.4–31.8)
TIBC: 227 ug/dL — ABNORMAL LOW (ref 250–450)
UIBC: 144 ug/dL

## 2018-09-13 LAB — COMPREHENSIVE METABOLIC PANEL
ALT: 103 U/L — ABNORMAL HIGH (ref 0–44)
AST: 172 U/L — ABNORMAL HIGH (ref 15–41)
Albumin: 2 g/dL — ABNORMAL LOW (ref 3.5–5.0)
Alkaline Phosphatase: 49 U/L (ref 38–126)
Anion gap: 7 (ref 5–15)
BUN: 17 mg/dL (ref 8–23)
CO2: 27 mmol/L (ref 22–32)
Calcium: 7.9 mg/dL — ABNORMAL LOW (ref 8.9–10.3)
Chloride: 101 mmol/L (ref 98–111)
Creatinine, Ser: 0.31 mg/dL — ABNORMAL LOW (ref 0.44–1.00)
GFR calc Af Amer: >60 mL/min (ref >60)
GFR calc non Af Amer: >60 mL/min (ref >60)
Glucose, Bld: 135 mg/dL — ABNORMAL HIGH (ref 70–99)
Potassium: 3.8 mmol/L (ref 3.5–5.1)
Sodium: 135 mmol/L (ref 135–145)
Total Bilirubin: 0.6 mg/dL (ref 0.3–1.2)
Total Protein: 5.6 g/dL — ABNORMAL LOW (ref 6.5–8.1)

## 2018-09-13 LAB — CBC
HCT: 27.4 % — ABNORMAL LOW (ref 36.0–46.0)
Hemoglobin: 9.2 g/dL — ABNORMAL LOW (ref 12.0–15.0)
MCH: 33.3 pg (ref 26.0–34.0)
MCHC: 33.6 g/dL (ref 30.0–36.0)
MCV: 99.3 fL (ref 80.0–100.0)
Platelets: 109 10*3/uL — ABNORMAL LOW (ref 150–400)
RBC: 2.76 MIL/uL — ABNORMAL LOW (ref 3.87–5.11)
RDW: 14.3 % (ref 11.5–15.5)
WBC: 8.3 10*3/uL (ref 4.0–10.5)
nRBC: 0.4 % — ABNORMAL HIGH (ref 0.0–0.2)

## 2018-09-13 LAB — GLUCOSE, CAPILLARY
Glucose-Capillary: 107 mg/dL — ABNORMAL HIGH (ref 70–99)
Glucose-Capillary: 135 mg/dL — ABNORMAL HIGH (ref 70–99)
Glucose-Capillary: 152 mg/dL — ABNORMAL HIGH (ref 70–99)
Glucose-Capillary: 193 mg/dL — ABNORMAL HIGH (ref 70–99)
Glucose-Capillary: 198 mg/dL — ABNORMAL HIGH (ref 70–99)
Glucose-Capillary: 285 mg/dL — ABNORMAL HIGH (ref 70–99)

## 2018-09-13 LAB — VITAMIN B12: Vitamin B-12: 372 pg/mL (ref 180–914)

## 2018-09-13 LAB — FERRITIN: Ferritin: 1330 ng/mL — ABNORMAL HIGH (ref 11–307)

## 2018-09-13 LAB — RETICULOCYTES
Immature Retic Fract: 25.4 % — ABNORMAL HIGH (ref 2.3–15.9)
RBC.: 3.13 MIL/uL — ABNORMAL LOW (ref 3.87–5.11)
Retic Count, Absolute: 105.8 10*3/uL (ref 19.0–186.0)
Retic Ct Pct: 3.4 % — ABNORMAL HIGH (ref 0.4–3.1)

## 2018-09-13 LAB — APTT: aPTT: 26 seconds (ref 24–36)

## 2018-09-13 LAB — CK: Total CK: 1239 U/L — ABNORMAL HIGH (ref 38–234)

## 2018-09-13 NOTE — Progress Notes (Signed)
Subjective:   Lisa Mcdowell is doing well today. She was able to eat soup and ice cream for breakfast this morning. She does have some burning when swallowing but it does not bother her significantly. She is experiencing some swelling her ankles bilaterally but it is not painful either. No acute complaints.   Son is at bedside. He was disheartened that his mother's progression has been slow. We discussed again that disease took a long time to present and will take take time to recover. Emphasized the importance of considering how far along she has come already, since she is tolerating swallowing liquids again. He understood.   Objective:  Vital signs in last 24 hours: Vitals:   09/12/18 1646 09/12/18 2019 09/12/18 2346 09/13/18 0319  BP: 113/76 112/68 122/68 (!) 111/58  Pulse: 93 76 88 72  Resp: 18 18 18 18   Temp: 98.7 F (37.1 C) 98.7 F (37.1 C) 98.1 F (36.7 C) 97.9 F (36.6 C)  TempSrc: Oral Oral Oral Oral  SpO2: 98% 100% 97% 99%  Weight:      Height:       Physical Exam Vitals signs reviewed.  Constitutional:      General: She is not in acute distress.    Appearance: She is obese. She is not ill-appearing or toxic-appearing.  HENT:     Head: Normocephalic and atraumatic.     Nose:     Comments: NG tube in place    Mouth/Throat:     Mouth: Mucous membranes are moist.  Cardiovascular:     Rate and Rhythm: Normal rate and regular rhythm.     Pulses: Normal pulses.     Heart sounds: Normal heart sounds. No murmur. No friction rub. No gallop.   Pulmonary:     Effort: Pulmonary effort is normal. No respiratory distress.     Breath sounds: Normal breath sounds.  Abdominal:     General: Abdomen is flat. Bowel sounds are normal. There is no distension.     Palpations: Abdomen is soft.     Tenderness: There is no abdominal tenderness. There is no guarding.  Musculoskeletal: Normal range of motion.  Skin:    General: Skin is warm and dry.  Neurological:     General: No  focal deficit present.     Mental Status: She is alert and oriented to person, place, and time.    Assessment/Plan:  Principal Problem:   Dysphagia Active Problems:   Secondary rhabdomyolysis   Adult type dermatomyositis (HCC)   Transaminitis   Elevated liver enzymes   Thrombocytopenia (HCC)  #Dysphagia, Oropharyngeal #Dermatomyositis Muscle biopsy on 8/20, and pathologyresults still pending. Completed IVIG treatment.  CK and AST continue to trend down, and will likely within a few days be within normal limits. Patient was able to eat full liquids today successfully. Likely Cortrak can be removed in a few days with this progression.  Will eventually need CT chest/abdomen/pelvis to start searching for possible malignancy.   - Solumedrol 40mg  BID - NG Tube feeds  - Biopsy results pending - Telemetry - Vitals signs q4hrs - Still pending [facility] transfer bed availability for formal rheumatology consult.   #Secondary Rhadomyolysis #Elevated LFTs Rheumatology has previously stated that CK will remain elevated for an extended period time before completely returning to normal. CK and AST continue to trend down, almost within normal limits.   - Continue trending CK  # Anemia:  Hemoglobin was slowly trended down during this admission and today is 9.2. MCV  fluctuates between macrocytic and normocytic. B12 within normal limits. Could be secondary to inflammation vs stress. Will check iron levels in case this is multifactorial.   - Folate still pending - Iron, TIBC, Ferritin pending  #Thrombocytopenia: Continues to improve daily.   - Trend platelets   Dispo: Anticipated discharge pending transfer to [facility]. Per nursing, they do not have any beds available as of today.   Dr. Jose Persia Internal Medicine PGY-1  Pager: [phone] 09/13/2018, 6:49 AM

## 2018-09-13 NOTE — Progress Notes (Signed)
  Speech Language Pathology Treatment: Dysphagia  Patient Details Name: Lisa Mcdowell MRN: [id number] DOB: [birth year] Today's Date: 09/13/2018 Time: 1005-1020 SLP Time Calculation (min) (ACUTE ONLY): 15 min  Assessment / Plan / Recommendation Clinical Impression  Patient seen to address dysphagia goals with son present who is also providing interpretation assistance. Patient sitting in recliner and feeding her self spoon bites of purees (smooth purees, yogurt, ice cream) and spoon sips of thin liquids. She told SLP that she is not able to eat foods like the grits because they are too dry and heavy/thick. She did not exhibit any overt s/s of aspiration or penetration but did report globus sensation in throat and feeling that puree solids were stuck or moving slowly in throat. SLP recommended patient alternate sips of thin liquids with bites of puree solids and after trying this, she reported that it seemed to help with bolus transit in pharynx. Patient appears to have a good appetite and hopefully will be able to get Cortrak removed soon.   HPI HPI: This is a 68 year old female with a PMH significant for dermatomysitis which appears to be a fairly new diagnosis for her. Patient was recently hospitalized for 7/26-7/31 for dermatomyositis and was subsequently treated with IV steroids.  She is discharged with p.o. prednisone.  Patient presented to the ED with a 4 day h/o progressively worsening dysphagia characterized by globus.       SLP Plan          Recommendations  Diet recommendations: Dysphagia 1 (puree);Thin liquid Liquids provided via: Teaspoon;Cup;Straw Medication Administration: Via alternative means Supervision: Patient able to self feed Compensations: Minimize environmental distractions;Slow rate;Small sips/bites;Follow solids with liquid Postural Changes and/or Swallow Maneuvers: Seated upright 90 degrees                        GO                Lisa Mcdowell  Lisa Mcdowell 09/13/2018, 11:13 AM  Lisa Baller, MA, CCC-SLP Speech Therapy [facility] Acute Rehab

## 2018-09-13 NOTE — Plan of Care (Signed)
Patient problem outcomes progressing, walked 3 feet with assistance

## 2018-09-14 LAB — GLUCOSE, CAPILLARY
Glucose-Capillary: 124 mg/dL — ABNORMAL HIGH (ref 70–99)
Glucose-Capillary: 163 mg/dL — ABNORMAL HIGH (ref 70–99)
Glucose-Capillary: 216 mg/dL — ABNORMAL HIGH (ref 70–99)
Glucose-Capillary: 112 mg/dL — ABNORMAL HIGH (ref 70–99)
Glucose-Capillary: 124 mg/dL — ABNORMAL HIGH (ref 70–99)

## 2018-09-14 LAB — CBC
HCT: 27.4 % — ABNORMAL LOW (ref 36.0–46.0)
Hemoglobin: 9 g/dL — ABNORMAL LOW (ref 12.0–15.0)
MCH: 32.7 pg (ref 26.0–34.0)
MCHC: 32.8 g/dL (ref 30.0–36.0)
MCV: 99.6 fL (ref 80.0–100.0)
Platelets: 124 10*3/uL — ABNORMAL LOW (ref 150–400)
RBC: 2.75 MIL/uL — ABNORMAL LOW (ref 3.87–5.11)
RDW: 14.6 % (ref 11.5–15.5)
WBC: 7.8 10*3/uL (ref 4.0–10.5)
nRBC: 0.5 % — ABNORMAL HIGH (ref 0.0–0.2)

## 2018-09-14 LAB — APTT: aPTT: 26 seconds (ref 24–36)

## 2018-09-14 LAB — CK: Total CK: 1270 U/L — ABNORMAL HIGH (ref 38–234)

## 2018-09-14 NOTE — Progress Notes (Signed)
Subjective:   Lisa Mcdowell is doing okay this AM. She feels that her feet are heavy but this is consistent with prior. She is eating better this AM. She ate some grits, ice cream, and eggs. The juice hurts to drink because it burns her throat. This only occurs with acidic foods. Overall her son feels she is progressing in the right direction and is eating better.   Discussed the plan to continue to have her work with speech. As her diet progresses, we are getting closer to removing her NG tube. Discussed that we will contact Lisa Mcdowell today to discuss transfer. There is a chance she will progress and be able to get home prior to transfer. They voice understanding. They are concerned about her ability to walk. We will touch base with PT to discuss therapy recs.   Objective:  Vital signs in last 24 hours: Vitals:   09/13/18 1559 09/13/18 2200 09/14/18 0454 09/14/18 0458  BP: 125/76 110/63 (!) 116/58   Pulse: (!) 102 75 84   Resp: 17 18 20    Temp: 98 F (36.7 C) 97.9 F (36.6 C) 99.1 F (37.3 C)   TempSrc: Oral Oral Oral   SpO2: 100% 99% 98%   Weight:    73.3 kg  Height:       Physical Exam Vitals signs and nursing note reviewed.  Constitutional:      General: She is not in acute distress.    Appearance: She is obese. She is not ill-appearing or toxic-appearing.  HENT:     Head: Normocephalic and atraumatic.     Nose:     Comments: NG tube in place    Mouth/Throat:     Mouth: Mucous membranes are moist.     Pharynx: Oropharynx is clear.     Comments: No evidence of thrush Pulmonary:     Effort: Pulmonary effort is normal. No respiratory distress.  Musculoskeletal:     Comments: Minimal edema surrounding bilateral malleolus that is non-tender and non-pitting.   Skin:    General: Skin is warm and dry.     Findings: No bruising or lesion.  Neurological:     General: No focal deficit present.     Mental Status: She is alert and oriented to person, place, and time.     Assessment/Plan:  Principal Problem:   Dysphagia Active Problems:   Secondary rhabdomyolysis   Adult type dermatomyositis (HCC)   Transaminitis   Elevated liver enzymes   Thrombocytopenia (HCC)  #Dysphagia, Oropharyngeal #Dermatomyositis Muscle biopsy on 8/20, and pathologyresults still pending. Completed IVIG treatment. CK continues to trend down but slower today than previously. Patient was able to eat soft foods today including grits, which she could not yesterday showing great improvement. Likely Cortrak can be removed in a few days with this progression.  - Solumedrol 40mg  BID - NG Tube feeds  - Biopsy results pending - Telemetry - Vitals signs q4hrs - Still pending Lisa Mcdowell transfer bed availability for formal rheumatology consult.   #Secondary Rhadomyolysis #Elevated LFTs Rheumatology has previously stated that CK will remain elevated for an extended period time before completely returning to normal. CK continues to trend down but less quickly. Wondering if the low 1000s will be her new baseline for now.   - Continue trending CK  # Anemia:  Hemoglobin was slowly trended down during this admission and today is 9.2. MCV fluctuates between macrocytic and normocytic. B12 within normal limits. No iron deficiency. No evidence of hemolysis. Reticulocyte index indicates  hypo-proliferation. No medications currently that could be causing this. Maybe secondary to significant inflammatory response in the last 5-6 months due to dermatomyositis.   - Folate still pending - Continue to monitor hemoglobin daily  #Thrombocytopenia: Continues to improve daily.   - Trend platelets  Dispo: Anticipated discharge pending either medical improvement with discharge home vs transfer to [facility] if bed becomes available.   Dr. Jose Persia Internal Medicine PGY-1  Pager: [phone] 09/14/2018, 6:46 AM

## 2018-09-15 LAB — APTT: aPTT: 26 seconds (ref 24–36)

## 2018-09-15 LAB — BASIC METABOLIC PANEL
Anion gap: 7 (ref 5–15)
BUN: 18 mg/dL (ref 8–23)
CO2: 26 mmol/L (ref 22–32)
Calcium: 7.9 mg/dL — ABNORMAL LOW (ref 8.9–10.3)
Chloride: 102 mmol/L (ref 98–111)
Creatinine, Ser: 0.31 mg/dL — ABNORMAL LOW (ref 0.44–1.00)
GFR calc Af Amer: >60 mL/min (ref >60)
GFR calc non Af Amer: >60 mL/min (ref >60)
Glucose, Bld: 97 mg/dL (ref 70–99)
Potassium: 3.6 mmol/L (ref 3.5–5.1)
Sodium: 135 mmol/L (ref 135–145)

## 2018-09-15 LAB — GLUCOSE, CAPILLARY
Glucose-Capillary: 164 mg/dL — ABNORMAL HIGH (ref 70–99)
Glucose-Capillary: 286 mg/dL — ABNORMAL HIGH (ref 70–99)
Glucose-Capillary: 141 mg/dL — ABNORMAL HIGH (ref 70–99)
Glucose-Capillary: 113 mg/dL — ABNORMAL HIGH (ref 70–99)

## 2018-09-15 LAB — FOLATE RBC
Folate, Hemolysate: 310 ng/mL
Folate, RBC: 1136 ng/mL (ref >498)
Hematocrit: 27.3 % — ABNORMAL LOW (ref 34.0–46.6)

## 2018-09-15 LAB — CBC
HCT: 27.7 % — ABNORMAL LOW (ref 36.0–46.0)
Hemoglobin: 9 g/dL — ABNORMAL LOW (ref 12.0–15.0)
MCH: 32.7 pg (ref 26.0–34.0)
MCHC: 32.5 g/dL (ref 30.0–36.0)
MCV: 100.7 fL — ABNORMAL HIGH (ref 80.0–100.0)
Platelets: 142 10*3/uL — ABNORMAL LOW (ref 150–400)
RBC: 2.75 MIL/uL — ABNORMAL LOW (ref 3.87–5.11)
RDW: 15 % (ref 11.5–15.5)
WBC: 6.3 10*3/uL (ref 4.0–10.5)
nRBC: 1.1 % — ABNORMAL HIGH (ref 0.0–0.2)

## 2018-09-15 LAB — CK: Total CK: 1497 U/L — ABNORMAL HIGH (ref 38–234)

## 2018-09-15 MED ORDER — METHYLPREDNISOLONE SODIUM SUCC 40 MG IJ SOLR
40.0000 mg | Freq: Two times a day (BID) | INTRAMUSCULAR | Status: AC
  Administered 2018-09-15: 40 mg via INTRAVENOUS
  Filled 2018-09-15: qty 1

## 2018-09-15 MED ORDER — ENSURE ENLIVE PO LIQD
237.0000 mL | Freq: Two times a day (BID) | ORAL | Status: DC
  Administered 2018-09-15 – 2018-09-22 (×10): 237 mL via ORAL

## 2018-09-15 MED ORDER — METHYLPREDNISOLONE SODIUM SUCC 125 MG IJ SOLR
80.0000 mg | INTRAMUSCULAR | Status: DC
  Administered 2018-09-16 – 2018-09-22 (×7): 80 mg via INTRAVENOUS
  Filled 2018-09-15 (×7): qty 2

## 2018-09-15 MED ORDER — JEVITY 1.2 CAL PO LIQD
1000.0000 mL | ORAL | Status: DC
  Administered 2018-09-15: 1000 mL
  Administered 2018-09-15: 45 mL/h
  Filled 2018-09-15 (×4): qty 1000

## 2018-09-15 MED ORDER — PRO-STAT SUGAR FREE PO LIQD
30.0000 mL | Freq: Every day | ORAL | Status: DC
  Administered 2018-09-16 – 2018-09-18 (×3): 30 mL
  Filled 2018-09-15 (×3): qty 30

## 2018-09-15 NOTE — Progress Notes (Signed)
Physical Therapy Treatment Patient Details Name: Lisa Mcdowell MRN: [id number] DOB: 1950/10/31 Today's Date: 09/15/2018    History of Present Illness Pt is a 68 y/o female admitted secondary to dysphagia. Pt with cortrack tube in place. Pt also with likely dermatomyositis and is s/p R upper thigh biopsy. Pt started on IVIG during admission. PMH includes GERD and HLD.     PT Comments    Patient seen for mobility progression. Pt continues to present with generalized weakness and requires +2 assist for functional transfers and gait training. Given current mobility level recommend post acute rehab for further skilled PT services to maximize independence and safety with mobility.     Follow Up Recommendations  SNF     Equipment Recommendations  Other (comment)(TBD next venue)    Recommendations for Other Services       Precautions / Restrictions Precautions Precautions: Fall;Other (comment) Precaution Comments: cortrack tube Restrictions Weight Bearing Restrictions: No    Mobility  Bed Mobility Overal bed mobility: Needs Assistance Bed Mobility: Supine to Sit     Supine to sit: Min assist     General bed mobility comments: cues for sequencing; assist to elevate trunk into sitting   Transfers Overall transfer level: Needs assistance Equipment used: Rolling walker (2 wheeled) Transfers: Sit to/from Stand Sit to Stand: Mod assist;+2 physical assistance         General transfer comment: assist to power up into standing; cues for safe hand placement however pt holds onto RW  Ambulation/Gait Ambulation/Gait assistance: +2 safety/equipment;Min assist;+2 physical assistance Gait Distance (Feet): (75 ft total with seated rest break) Assistive device: Rolling walker (2 wheeled) Gait Pattern/deviations: Step-through pattern;Trunk flexed;Decreased step length - right;Decreased step length - left;Decreased dorsiflexion - right;Decreased dorsiflexion - left Gait velocity:  decreased   General Gait Details: pt with short step lengths and decreased heel strike/toe off; cues for upright posture; assistance for balance   Stairs             Wheelchair Mobility    Modified Rankin (Stroke Patients Only)       Balance Overall balance assessment: Needs assistance Sitting-balance support: No upper extremity supported Sitting balance-Leahy Scale: Fair     Standing balance support: Bilateral upper extremity supported;During functional activity Standing balance-Leahy Scale: Poor Standing balance comment: Reliant on BUE support                             Cognition Arousal/Alertness: Awake/alert Behavior During Therapy: WFL for tasks assessed/performed Overall Cognitive Status: Within Functional Limits for tasks assessed                                 General Comments: appears WFL for mobility tasks during session      Exercises      General Comments        Pertinent Vitals/Pain Pain Assessment: Faces Faces Pain Scale: No hurt Pain Intervention(s): Monitored during session;Repositioned    Home Living                      Prior Function            PT Goals (current goals can now be found in the care plan section) Acute Rehab PT Goals Patient Stated Goal: to get stronger Progress towards PT goals: Progressing toward goals    Frequency    Min 2X/week  PT Plan Discharge plan needs to be updated    Co-evaluation PT/OT/SLP Co-Evaluation/Treatment: Yes Reason for Co-Treatment: For patient/therapist safety;To address functional/ADL transfers;Complexity of the patient's impairments (multi-system involvement)   OT goals addressed during session: ADL's and self-care;Proper use of Adaptive equipment and DME      AM-PAC PT "6 Clicks" Mobility   Outcome Measure  Help needed turning from your back to your side while in a flat bed without using bedrails?: A Little Help needed moving from  lying on your back to sitting on the side of a flat bed without using bedrails?: A Little Help needed moving to and from a bed to a chair (including a wheelchair)?: A Lot Help needed standing up from a chair using your arms (e.g., wheelchair or bedside chair)?: A Lot Help needed to walk in hospital room?: A Lot Help needed climbing 3-5 steps with a railing? : A Lot 6 Click Score: 14    End of Session Equipment Utilized During Treatment: Gait belt Activity Tolerance: Patient tolerated treatment well Patient left: in chair;with call bell/phone within reach;with family/visitor present Nurse Communication: Mobility status PT Visit Diagnosis: Unsteadiness on feet (R26.81);Muscle weakness (generalized) (M62.81);Difficulty in walking, not elsewhere classified (R26.2)     Time: [id number] PT Time Calculation (min) (ACUTE ONLY): 24 min  Charges:  $Gait Training: 8-22 mins                     Earney Navy, PTA Acute Rehabilitation Services Pager: [phone] Office: [phone]     Darliss Cheney 09/15/2018, 10:19 AM

## 2018-09-15 NOTE — Progress Notes (Signed)
Nutrition Follow-up  DOCUMENTATION CODES:   Not applicable  INTERVENTION:  Provide Ensure Enlive po BID, each supplement provides 350 kcal and 20 grams of protein.  Reduce tube feeding using Jevity 1.2 formula via Cortrak NGT to new goal rate of 45 ml/hr with 30 ml Prostat once daily.   Free water flushes of 400 ml every 8 hours per MD.  Tube feeding regimen to provide 1396 kcal (80% of kcal needs), 75 grams of protein (88% of protein needs), 2075 ml free water.   May discontinue tube feeding once diet advances past liquids and meal completion consistently >/= 50%.   NUTRITION DIAGNOSIS:   Inadequate oral intake related to dysphagia as evidenced by NPO status; ongoing  GOAL:   Patient will meet greater than or equal to 90% of their needs; met with TF  MONITOR:   TF tolerance, Diet advancement, Weight trends, Labs, I & O's, Skin  REASON FOR ASSESSMENT:   Consult Enteral/tube feeding initiation and management  ASSESSMENT:   68 year old female with recently diagnosed dermatomyositis, now with dysphagia to solids and liquids. Cortrak NGT replaced 8/17. Tip of tube in stomach.   Diet has been advanced to a full liquid diet. Swallowing and po intake improving. Meal completion 70%. RD to reduce tube feeding to encouraged po intake during the day. RD to additionally order nutritional supplements to aid in intake. May discontinue tube feeding once diet advances past liquids and meal completion consistently >/= 50%.   Labs and medications reviewed.   Diet Order:   Diet Order            Diet full liquid Room service appropriate? Yes; Fluid consistency: Thin  Diet effective now              EDUCATION NEEDS:   Not appropriate for education at this time  Skin:  Skin Assessment: Reviewed RN Assessment  Last BM:  8/27  Height:   Ht Readings from Last 1 Encounters:  09/04/18 5' 5.98" (1.676 m)    Weight:   Wt Readings from Last 1 Encounters:  09/15/18 73.4 kg     Ideal Body Weight:  59 kg  BMI:  Body mass index is 26.13 kg/m.  Estimated Nutritional Needs:   Kcal:  5993-5701  Protein:  85-100 grams  Fluid:  1.7 - 1.9 L/day    Corrin Parker, MS, RD, LDN Pager # [phone] After hours/ weekend pager # [phone]

## 2018-09-15 NOTE — Progress Notes (Signed)
Subjective:   Lisa Mcdowell reports she is doing okay today.  She was able to eat grits, sweet potatoes, and ice cream for breakfast.  She is not having as much pain with swallowing as prior.  However she is feeling quite weak, especially with walking.  Discussed possibility of discharging from hospital stay here prior to transfer at [facility] being available.  Discussed that PT is recommending a rehab facility and patient's daughter is amenable to this.  We also discussed that patient's 21-day stay and involved lots of bedrest is definitely contributing to her weakness and may take time to recondition.  All questions and concerns were addressed.  Objective:  Vital signs in last 24 hours: Vitals:   09/14/18 2030 09/14/18 2349 09/15/18 0409 09/15/18 0601  BP: 120/80 (!) 115/57 112/61   Pulse: 92 92 81   Resp:  20 18   Temp: 98.4 F (36.9 C) 98 F (36.7 C) 98.2 F (36.8 C)   TempSrc: Oral Oral Oral   SpO2: 99% 97% 99%   Weight:    73.4 kg  Height:       Physical Exam Vitals signs and nursing note reviewed.  Constitutional:      General: She is not in acute distress.    Appearance: She is obese. She is not ill-appearing or toxic-appearing.  HENT:     Head: Normocephalic and atraumatic.     Nose:     Comments: NG tube in place    Mouth/Throat:     Mouth: Mucous membranes are moist.     Pharynx: Oropharynx is clear.  Pulmonary:     Effort: Pulmonary effort is normal. No respiratory distress.  Skin:    General: Skin is warm and dry.     Findings: No bruising or lesion.  Neurological:     General: No focal deficit present.     Mental Status: She is alert and oriented to person, place, and time.    Assessment/Plan:  Principal Problem:   Dysphagia Active Problems:   Secondary rhabdomyolysis   Adult type dermatomyositis (HCC)   Transaminitis   Elevated liver enzymes   Thrombocytopenia (HCC)  #Dysphagia, Oropharyngeal #Dermatomyositis Muscle biopsy on 8/20, and  pathologyresults still pending. Completed IVIG treatment. Patient was able to eat soft foods today including grits and sweet potatoes. Likely Cortrak can be removed soon with this progression.  Discussed with PT and OT that patient may be discharged from this hospital and no longer transferred.  They have recommended SNF. In addition, patient has developed some difficulty sleeping. Will transition solumedrol to once per day in the morning to see if this helps, since afternoon steroid dose may be interfering.   - Solumedrol 80mg  QD - NG Tube feeds  - Advance diet as tolerated - Biopsy results pending - Telemetry - Vitals signs q4hrs  #Secondary Rhadomyolysis #Elevated LFTs Rheumatology has previously stated that CK will remain elevated for an extended period time before completely returning to normal. CK is slightly elevated compared to prior. May be that she is reaching a new baseline in the lower 1000s.   - Continue trending CK  # Anemia:  MCV fluctuates between macrocytic and normocytic. B12 within normal limits. No iron deficiency. No evidence of hemolysis. Reticulocyte index indicates hypo-proliferation. No medications currently that could be causing this. Likely due to persistent inflammatory response for the past several months.   - Folate still pending - Continue to monitor hemoglobin daily  #Thrombocytopenia: Continues to improve daily.   -  Trend platelets   Dispo: Anticipated discharge pending either medical improvement with discharge to SNF vs transfer to [facility] if bed becomes available.   Dr. Jose Persia Internal Medicine PGY-1  Pager: [phone] 09/15/2018, 6:42 AM

## 2018-09-15 NOTE — Progress Notes (Signed)
Occupational Therapy Treatment Patient Details Name: Lisa Mcdowell MRN: [id number] DOB: 06-Jun-1950 Today's Date: 09/15/2018    History of present illness Pt is a 68 y/o female admitted secondary to dysphagia. Pt with cortrack tube in place. Pt also with likely dermatomyositis and is s/p R upper thigh biopsy. Pt started on IVIG during admission. PMH includes GERD and HLD.    OT comments  Pt agreeable to OT intervention with daughter present in room to assist with interpretation. Pt required mod A of 2 to stand from elevated bed level. Pt ambulating with RW and min A of 2 for safety and balance. Pt fatigues very quickly but able to ambulate 75' this session with 1 rest break. Pt's heart rate increased to 120 bpm with mobility. Pt's daughter requests therapy before returning home. Pt's daughter does not answer therapist when asked if they can provide 24/7 Assistance at home. Daughter is unable to assist with standing/transferring pt secondary to limitations herself. Pt not recommended for CIR secondary to unlikely pt able to participate in intensive therapies at this time. Caregiver and pt are agreeable to this recommendation.   Follow Up Recommendations  SNF;Supervision/Assistance - 24 hour    Equipment Recommendations  Other (comment)(defer to next venue of care)       Precautions / Restrictions Precautions Precautions: Fall;Other (comment) Precaution Comments: cortrack tube Restrictions Weight Bearing Restrictions: No       Mobility Bed Mobility Overal bed mobility: Needs Assistance Bed Mobility: Supine to Sit     Supine to sit: Min assist     General bed mobility comments: cues for sequencing; assist to elevate trunk into sitting   Transfers Overall transfer level: Needs assistance Equipment used: Rolling walker (2 wheeled) Transfers: Sit to/from Stand Sit to Stand: Mod assist;+2 physical assistance         General transfer comment: assist to power up into standing;  cues for safe hand placement however pt holds onto RW    Balance Overall balance assessment: Needs assistance Sitting-balance support: No upper extremity supported Sitting balance-Leahy Scale: Fair     Standing balance support: Bilateral upper extremity supported;During functional activity Standing balance-Leahy Scale: Poor Standing balance comment: Reliant on BUE support          ADL either performed or assessed with clinical judgement   ADL Overall ADL's : Needs assistance/impaired       Lower Body Dressing: Sit to/from stand;Total assistance Lower Body Dressing Details (indicate cue type and reason): total A to don B shoes         Vision Baseline Vision/History: No visual deficits Patient Visual Report: No change from baseline            Cognition Arousal/Alertness: Awake/alert Behavior During Therapy: WFL for tasks assessed/performed Overall Cognitive Status: Within Functional Limits for tasks assessed        General Comments: appears WFL for mobility tasks during session                   Pertinent Vitals/ Pain       Pain Assessment: Faces Faces Pain Scale: No hurt Pain Intervention(s): Monitored during session;Repositioned         Frequency  Min 2X/week        Progress Toward Goals  OT Goals(current goals can now be found in the care plan section)  Progress towards OT goals: Progressing toward goals  Acute Rehab OT Goals Patient Stated Goal: to get stronger OT Goal Formulation: With patient/family Time For Goal  Achievement: 09/25/18 Potential to Achieve Goals: Good  Plan Discharge plan needs to be updated    Co-evaluation    PT/OT/SLP Co-Evaluation/Treatment: Yes Reason for Co-Treatment: For patient/therapist safety;To address functional/ADL transfers;Complexity of the patient's impairments (multi-system involvement)   OT goals addressed during session: ADL's and self-care;Proper use of Adaptive equipment and DME      AM-PAC  OT "6 Clicks" Daily Activity     Outcome Measure   Help from another person eating meals?: A Little Help from another person taking care of personal grooming?: A Lot Help from another person toileting, which includes using toliet, bedpan, or urinal?: Total Help from another person bathing (including washing, rinsing, drying)?: A Lot   Help from another person to put on and taking off regular lower body clothing?: Total 6 Click Score: 9    End of Session Equipment Utilized During Treatment: Rolling walker  OT Visit Diagnosis: Unsteadiness on feet (R26.81);Muscle weakness (generalized) (M62.81)   Activity Tolerance Patient limited by fatigue   Patient Left in chair;with call bell/phone within reach;with chair alarm set;with family/visitor present   Nurse Communication Mobility status;Precautions        Time: [id number] OT Time Calculation (min): 29 min  Charges: OT General Charges $OT Visit: 1 Visit OT Treatments $Therapeutic Activity: 8-22 mins    Darleen Crocker P 09/15/2018, 10:12 AM

## 2018-09-16 ENCOUNTER — Encounter (HOSPITAL_COMMUNITY): Payer: Self-pay | Admitting: Surgery

## 2018-09-16 LAB — BASIC METABOLIC PANEL
Anion gap: 7 (ref 5–15)
BUN: 19 mg/dL (ref 8–23)
CO2: 27 mmol/L (ref 22–32)
Calcium: 7.9 mg/dL — ABNORMAL LOW (ref 8.9–10.3)
Chloride: 103 mmol/L (ref 98–111)
Creatinine, Ser: 0.45 mg/dL (ref 0.44–1.00)
GFR calc Af Amer: >60 mL/min (ref >60)
GFR calc non Af Amer: >60 mL/min (ref >60)
Glucose, Bld: 108 mg/dL — ABNORMAL HIGH (ref 70–99)
Potassium: 3.8 mmol/L (ref 3.5–5.1)
Sodium: 137 mmol/L (ref 135–145)

## 2018-09-16 LAB — CBC
HCT: 28.9 % — ABNORMAL LOW (ref 36.0–46.0)
Hemoglobin: 9.2 g/dL — ABNORMAL LOW (ref 12.0–15.0)
MCH: 32.7 pg (ref 26.0–34.0)
MCHC: 31.8 g/dL (ref 30.0–36.0)
MCV: 102.8 fL — ABNORMAL HIGH (ref 80.0–100.0)
Platelets: 149 10*3/uL — ABNORMAL LOW (ref 150–400)
RBC: 2.81 MIL/uL — ABNORMAL LOW (ref 3.87–5.11)
RDW: 15.5 % (ref 11.5–15.5)
WBC: 5.8 10*3/uL (ref 4.0–10.5)
nRBC: 1.4 % — ABNORMAL HIGH (ref 0.0–0.2)

## 2018-09-16 LAB — CK: Total CK: 1133 U/L — ABNORMAL HIGH (ref 38–234)

## 2018-09-16 LAB — GLUCOSE, CAPILLARY
Glucose-Capillary: 105 mg/dL — ABNORMAL HIGH (ref 70–99)
Glucose-Capillary: 98 mg/dL (ref 70–99)
Glucose-Capillary: 225 mg/dL — ABNORMAL HIGH (ref 70–99)
Glucose-Capillary: 109 mg/dL — ABNORMAL HIGH (ref 70–99)

## 2018-09-16 LAB — APTT: aPTT: 28 seconds (ref 24–36)

## 2018-09-16 MED ORDER — PANTOPRAZOLE SODIUM 40 MG PO PACK
40.0000 mg | PACK | Freq: Two times a day (BID) | ORAL | Status: DC
  Administered 2018-09-16 – 2018-09-22 (×12): 40 mg via ORAL
  Filled 2018-09-16 (×20): qty 20

## 2018-09-16 MED ORDER — HYDROXYZINE HCL 25 MG PO TABS
25.0000 mg | ORAL_TABLET | Freq: Every evening | ORAL | Status: DC | PRN
  Administered 2018-09-19: 25 mg via ORAL
  Filled 2018-09-16: qty 1

## 2018-09-16 NOTE — Progress Notes (Addendum)
  Subjective:  Pt seen at the bedside this AM on rounds. Son is at the bedside. Pt says she feels good but had a HA this morning. She says she was able to eat some food yesterday. But pt does say its hard to swallow water, but able to swallow ice better.    Objective:    Vital Signs (last 24 hours): Vitals:   09/15/18 1546 09/15/18 2028 09/15/18 2030 09/16/18 0004  BP: 117/73 (!) 124/55 (!) 124/55 119/78  Pulse: (!) 115 (!) 108 (!) 102 (!) 101  Resp: (!) 24 18 18 18   Temp: 98.2 F (36.8 C) 98.3 F (36.8 C) 98.3 F (36.8 C) 98 F (36.7 C)  TempSrc: Oral Oral Oral Oral  SpO2: 99% 100% 100% 100%  Weight:      Height:        Physical Exam: General Alert and oriented, no acute distress  Cardiac Regular rate and rhythm, no murmurs, rubs, or gallops  Pulmonary Clear to auscultation bilaterally without wheezes, rhonchi, or rales  Extremities No peripheral edema      Assessment/Plan:   Principal Problem:   Dysphagia Active Problems:   Secondary rhabdomyolysis   Adult type dermatomyositis (HCC)   Transaminitis   Elevated liver enzymes   Thrombocytopenia (HCC)  Patient is a 68 year old female who presented with progressive worsening of dysphagia toward solids and liquids for 4 days prior to admission.  # Dermatomyositis: Clinical picture consistent with dermatomyositis.  EGD did not reveal a structural cause.  Case was discussed with rheumatology who recommended continuing steroids and patient is now status post IVIG x2.  Muscle biopsy was performed on 8/20, path pending.   * We will follow-up with pathologist today. * Solu-Medrol 80 mg daily.  Rheumatology had recommended a prolonged 6-week course followed by tapering.  Will discharge on equivalent oral dose with 2-week follow-up with outpatient rheumatology who can manage tapering.  Patient has established care with [facility] rheumatology as an outpatient. * Remove NG tube and advance diet as tolerated  # Secondary  Rhabdomyolysis: AST/ALT elevated to 172/103.  CK elevated on presentation and has been trended.  Continues to downtrend.  # Anemia: Thought to be secondary to persist inflammatory response over months prior to admission.  MCV has fluctuated between macrocytic and normocytic, B12 within the limits, no iron deficiency, no evidence for hemolysis, and reticulocyte index indicates hypo-proliferation.  # Thrombocytopenia: Continues to improve.  We will continue to trend.  # Dispo: Anticipated discharge pending medical improvement.  PT recommends SNF placement but this may be difficult as patient does not have insurance.  Patient on [facility] list for transfer to [facility].  However, this may not be necessary if patient improves.   Jeanmarie Hubert, MD 09/16/2018, 7:17 AM Pager: [phone]

## 2018-09-16 NOTE — Progress Notes (Signed)
  Speech Language Pathology Treatment: Dysphagia  Patient Details Name: Lisa Mcdowell MRN: [id number] DOB: 1950/01/19 Today's Date: 09/16/2018 Time: [id number] SLP Time Calculation (min) (ACUTE ONLY): 21 min  Assessment / Plan / Recommendation Clinical Impression  Pt demonstrates improving intake with pureed texture. Observed pt eat and bowl of grits at a normal pace with no difficulty whatsoever. She reports ongoing anxiety and some throat clearing with water and son reports that she has only really been chewing ice rather than drinking. Suspect there is increased anxiety with water due to occasionally penetration events with thin liquids and also prior trauma of choking with water. Provided trials of nectar thick liquid with is slower and more comfortable to pt. She consumed with with a teaspoon without any signs of aspiration and only brief oral holding. Recommend pt upgrade to puree diet and nectar thick liquids. Will f/u for tolerance. Removal of NG will also increase pts comfort with swallowing.   HPI HPI: This is a 68 year old female with a PMH significant for dermatomysitis which appears to be a fairly new diagnosis for her. Patient was recently hospitalized for 7/26-7/31 for dermatomyositis and was subsequently treated with IV steroids.  She is discharged with p.o. prednisone.  Patient presented to the ED with a 4 day h/o progressively worsening dysphagia characterized by globus.       SLP Plan  Continue with current plan of care       Recommendations  Diet recommendations: Dysphagia 1 (puree);Nectar-thick liquid Liquids provided via: Teaspoon;Cup;Straw Medication Administration: Via alternative means Supervision: Patient able to self feed Compensations: Minimize environmental distractions;Slow rate;Small sips/bites;Follow solids with liquid Postural Changes and/or Swallow Maneuvers: Seated upright 90 degrees                Plan: Continue with current plan of care        GO              [name], MA [address] Pager [phone] Office [phone]   Lynann Beaver 09/16/2018, 8:52 AM

## 2018-09-17 DIAGNOSIS — L8990 Pressure ulcer of unspecified site, unspecified stage: Secondary | ICD-10-CM | POA: Insufficient documentation

## 2018-09-17 LAB — CBC
HCT: 27.5 % — ABNORMAL LOW (ref 36.0–46.0)
Hemoglobin: 8.9 g/dL — ABNORMAL LOW (ref 12.0–15.0)
MCH: 33.2 pg (ref 26.0–34.0)
MCHC: 32.4 g/dL (ref 30.0–36.0)
MCV: 102.6 fL — ABNORMAL HIGH (ref 80.0–100.0)
Platelets: 152 10*3/uL (ref 150–400)
RBC: 2.68 MIL/uL — ABNORMAL LOW (ref 3.87–5.11)
RDW: 15.5 % (ref 11.5–15.5)
WBC: 6 10*3/uL (ref 4.0–10.5)
nRBC: 0 % (ref 0.0–0.2)

## 2018-09-17 LAB — GLUCOSE, CAPILLARY
Glucose-Capillary: 194 mg/dL — ABNORMAL HIGH (ref 70–99)
Glucose-Capillary: 86 mg/dL (ref 70–99)
Glucose-Capillary: 141 mg/dL — ABNORMAL HIGH (ref 70–99)
Glucose-Capillary: 200 mg/dL — ABNORMAL HIGH (ref 70–99)
Glucose-Capillary: 148 mg/dL — ABNORMAL HIGH (ref 70–99)

## 2018-09-17 LAB — CK: Total CK: 977 U/L — ABNORMAL HIGH (ref 38–234)

## 2018-09-17 LAB — APTT: aPTT: 28 seconds (ref 24–36)

## 2018-09-17 MED ORDER — RESOURCE THICKENUP CLEAR PO POWD
ORAL | Status: DC | PRN
  Filled 2018-09-17: qty 125

## 2018-09-17 NOTE — Progress Notes (Signed)
  Subjective:  Patient seen at bedside this a.m. on rounds.  Daughter on phone.  Patient says that her eating has improved and she was able to drink water this morning without difficulty.  She reports continued heaviness in her legs and is worried about her continued weakness.  Patient states that she lives at home with her son and daughter but they both work so there is no one to help take care of her.  She is interested in being able to have continued physical therapy if this would help her regain her strength.  We talked that this might be difficult she does not have insurance patient said she would want to pay for therapy if she could.  Daughter asked about [facility] waitlist.  We discussed that she is unlikely to qualify for an inpatient admission at this point as her dysphasia is resolving.    Objective:    Vital Signs (last 24 hours): Vitals:   09/16/18 2335 09/17/18 0400 09/17/18 0500 09/17/18 0817  BP: 124/66 112/60  101/84  Pulse: 81 91  (!) 104  Resp: 17 16  18   Temp: 98.1 F (36.7 C) 98 F (36.7 C)  98.4 F (36.9 C)  TempSrc: Oral Oral  Oral  SpO2: 99% 98%  100%  Weight:   73.5 kg   Height:        Physical Exam: General Alert and answers questions appropriately, no acute distress  Cardiac Regular rate and rhythm, no murmurs, rubs, or gallops  Pulmonary Clear to auscultation bilaterally without wheezes, rhonchi, or rales  Extremities No peripheral edema      Assessment/Plan:   Principal Problem:   Dysphagia Active Problems:   Secondary rhabdomyolysis   Adult type dermatomyositis (HCC)   Transaminitis   Elevated liver enzymes   Thrombocytopenia (HCC)   Pressure injury of skin  Patient is a 68 year old female who presented with progressive worsening of dysphagia toward solids and liquids for 4 days prior to admission.  # Dermatomyositis: Clinical picture consistent with dermatomyositis and pathologic biopsy reveals inflammatory myopathy.  EGD did not reveal a  structural cause.  Case was discussed with rheumatology who recommended continuing steroids and patient is now status post IVIG x2.  Muscle biopsy was performed on 8/20, path pending.   * Solu-Medrol 80 mg daily.  Rheumatology had recommended a prolonged 6-week course followed by tapering.  Will discharge on equivalent oral dose with 2-week follow-up with outpatient rheumatology who can manage tapering.  Patient has established care with [facility] rheumatology as an outpatient. * Patient on dysphagia 1 diet with nectar thick fluids. * Care management consulted to evaluate options for SNF  # Secondary Rhabdomyolysis: AST/ALT elevated to 172/103 on presentation.  CK elevated on presentation and has been downtrending since admission.  # Anemia: Thought to be secondary to persist inflammatory response over months prior to admission.  MCV has fluctuated between macrocytic and normocytic, B12 within normal limits, no iron deficiency, no evidence for hemolysis, and reticulocyte index indicates hypo-proliferation.  # Thrombocytopenia: Continues to improve.  We will continue to trend.  # Dispo: Anticipated discharge pending medical improvement and safe discharge. PT recommends SNF placement but this may be difficult as patient does not have insurance.  Patient on waitlist to [facility] but at time this could occur patient is unlikely to still qualify for inpatient admission.    Jeanmarie Hubert, MD 09/17/2018, 10:33 AM Pager: [phone]

## 2018-09-17 NOTE — Progress Notes (Signed)
  Speech Language Pathology Treatment: Dysphagia  Patient Details Name: Lisa Mcdowell MRN: [id number] DOB: 1950/05/08 Today's Date: 09/17/2018 Time: [id number] SLP Time Calculation (min) (ACUTE ONLY): 25 min  Assessment / Plan / Recommendation Clinical Impression  Visited pt this am, no breakfast tray as no one had helped her order so I assisted her with this. Given thickened diet she was not able to order some of the soups she was interest in getting, so I liberalized die to thin liquids, but also ordered a can of thickener so liquids can be thickened in the room if she would like to have them thicker. It is not necessary if she is able to drink thin without prolonged oral holding, the thicker texture just seems to give her some increased confidence. Recommend continuing a puree diet and thin/nectar thick liquids per pts comfort. Pt tearful about rehabilitation, wants to get stronger but is worried about acceptance at next level of care. Consider CIR if possible?    HPI HPI: This is a 68 year old female with a PMH significant for dermatomysitis which appears to be a fairly new diagnosis for her. Patient was recently hospitalized for 7/26-7/31 for dermatomyositis and was subsequently treated with IV steroids.  She is discharged with p.o. prednisone.  Patient presented to the ED with a 4 day h/o progressively worsening dysphagia characterized by globus.       SLP Plan  Continue with current plan of care       Recommendations  Diet recommendations: Dysphagia 1 (puree);Thin liquid Liquids provided via: Teaspoon;Cup;Straw Medication Administration: Via alternative means Supervision: Patient able to self feed Compensations: Minimize environmental distractions;Slow rate;Small sips/bites;Follow solids with liquid Postural Changes and/or Swallow Maneuvers: Seated upright 90 degrees                Plan: Continue with current plan of care       GO               [name], MA  [place] Pager [phone] Office [phone]  Lynann Beaver 09/17/2018, 10:19 AM

## 2018-09-18 LAB — CBC
HCT: 28.6 % — ABNORMAL LOW (ref 36.0–46.0)
Hemoglobin: 9.1 g/dL — ABNORMAL LOW (ref 12.0–15.0)
MCH: 32.7 pg (ref 26.0–34.0)
MCHC: 31.8 g/dL (ref 30.0–36.0)
MCV: 102.9 fL — ABNORMAL HIGH (ref 80.0–100.0)
Platelets: 167 10*3/uL (ref 150–400)
RBC: 2.78 MIL/uL — ABNORMAL LOW (ref 3.87–5.11)
RDW: 15.6 % — ABNORMAL HIGH (ref 11.5–15.5)
WBC: 5.6 10*3/uL (ref 4.0–10.5)
nRBC: 0 % (ref 0.0–0.2)

## 2018-09-18 LAB — GLUCOSE, CAPILLARY
Glucose-Capillary: 81 mg/dL (ref 70–99)
Glucose-Capillary: 164 mg/dL — ABNORMAL HIGH (ref 70–99)
Glucose-Capillary: 164 mg/dL — ABNORMAL HIGH (ref 70–99)
Glucose-Capillary: 109 mg/dL — ABNORMAL HIGH (ref 70–99)

## 2018-09-18 LAB — CK: Total CK: 879 U/L — ABNORMAL HIGH (ref 38–234)

## 2018-09-18 LAB — APTT: aPTT: 28 seconds (ref 24–36)

## 2018-09-18 MED ORDER — PRO-STAT SUGAR FREE PO LIQD
30.0000 mL | Freq: Every day | ORAL | Status: DC
  Administered 2018-09-19 – 2018-09-22 (×4): 30 mL via ORAL
  Filled 2018-09-18 (×4): qty 30

## 2018-09-18 NOTE — Progress Notes (Signed)
Nutrition Follow-up  DOCUMENTATION CODES:   Not applicable  INTERVENTION:  Continue Ensure Enlive po BID (thickened to appropriate consistency), each supplement provides 350 kcal and 20 grams of protein.  Continue 30 ml Prostat po once daily, each supplement provides 100 kcal and 15 grams of protein.   Encourage adequate PO intake.   NUTRITION DIAGNOSIS:   Inadequate oral intake related to dysphagia as evidenced by NPO status; diet advanced; improving  GOAL:   Patient will meet greater than or equal to 90% of their needs; progressing  MONITOR:   PO intake, Supplement acceptance, Diet advancement, Skin, Weight trends, Labs, I & O's  REASON FOR ASSESSMENT:   Consult Enteral/tube feeding initiation and management  ASSESSMENT:   68 year old female with recently diagnosed dermatomyositis, now with dysphagia to solids and liquids. Clinical picture consistent with dermatomyositis and pathologic biopsy reveals inflammatory myopathy  Diet has been advanced to a dysphagia 1 diet with nectar thick liquids. Meal completion has been 70%. Cortrak NGT removed 9/1. Pt currently has Ensure and Prostat ordered and has been consuming them. RD to continue with current orders to aid in caloric and protein needs.    Labs and medications reviewed.   Diet Order:   Diet Order            DIET - DYS 1 Room service appropriate? Yes; Fluid consistency: Nectar Thick  Diet effective now              EDUCATION NEEDS:   Not appropriate for education at this time  Skin:  Skin Assessment: Skin Integrity Issues: Skin Integrity Issues:: Stage II Stage II: R buttocks  Last BM:  9/2  Height:   Ht Readings from Last 1 Encounters:  09/04/18 5' 5.98" (1.676 m)    Weight:   Wt Readings from Last 1 Encounters:  09/17/18 73.5 kg    Ideal Body Weight:  59 kg  BMI:  Body mass index is 26.17 kg/m.  Estimated Nutritional Needs:   Kcal:  [id number]  Protein:  85-100 grams  Fluid:  1.7 -  1.9 L/day    Corrin Parker, MS, RD, LDN Pager # [phone] After hours/ weekend pager # [phone]

## 2018-09-18 NOTE — Progress Notes (Signed)
Occupational Therapy Treatment Patient Details Name: Lisa Mcdowell MRN: [id number] DOB: [birth year] Today's Date: 09/18/2018    History of present illness Pt is a 68 y/o female admitted secondary to dysphagia. Pt with cortrack tube in place. Pt also with likely dermatomyositis and is s/p R upper thigh biopsy. Pt started on IVIG during admission. PMH includes GERD and HLD.    OT comments  Pt progressing. Pt with b/l tremors in hands- pt required minA to apply toothpaste to brush. Pt progressing with mobility ambulating to sink with minA +2 for stability and to push RW in front of pt as pt stayed too close to it. Pt performing HEP with BUEs included below and chair push-ups with poor ability to lift bottom from chair. Pt's son in room throughout session.  Pt exhibiting motivation to return to PLOF,but limited by strength deficits and decreased ability to care for self. Pt would greatly benefit from continued OT skilled services for ADL,mobility and safety. OT following acutely.  Disposition updated to CIR.   Follow Up Recommendations  CIR    Equipment Recommendations  3 in 1 bedside commode    Recommendations for Other Services      Precautions / Restrictions Precautions Precautions: Fall Restrictions Weight Bearing Restrictions: No       Mobility Bed Mobility               General bed mobility comments: pt OOB in chair upon arrival  Transfers Overall transfer level: Needs assistance Equipment used: Rolling walker (2 wheeled) Transfers: Sit to/from Stand Sit to Stand: Mod assist;Max assist;+2 physical assistance         General transfer comment: cues for safe hand placement and technique; assistance required to power up into standing     Balance Overall balance assessment: Needs assistance Sitting-balance support: No upper extremity supported Sitting balance-Leahy Scale: Fair     Standing balance support: Bilateral upper extremity supported;During functional  activity Standing balance-Leahy Scale: Poor Standing balance comment: Reliant on BUE support                            ADL either performed or assessed with clinical judgement   ADL Overall ADL's : Needs assistance/impaired     Grooming: Wash/dry hands;Wash/dry face;Oral care;Brushing hair;Minimal assistance;Standing Grooming Details (indicate cue type and reason): tremors in hands- pt required minA to apply toothpaste to brush                 Toilet Transfer: Min guard;RW;Ambulation           Functional mobility during ADLs: Min guard;Minimal assistance;Rolling walker;Cueing for safety;Cueing for sequencing General ADL Comments: Pt minA to minguardA with RW as pt unsteady and keeping RW too close. Pt requires assist for power up. Pt education on new exercises to performs when seated in recliner     Vision       Perception     Praxis      Cognition Arousal/Alertness: Awake/alert Behavior During Therapy: WFL for tasks assessed/performed Overall Cognitive Status: Within Functional Limits for tasks assessed                                 General Comments: son present and translated        Exercises Exercises: General Upper Extremity;Other exercises General Exercises - Upper Extremity Shoulder Flexion: AROM;10 reps;Seated Shoulder ABduction: AROM;10 reps;Seated Elbow Flexion: AROM;10  reps;Seated Wrist Flexion: AROM;10 reps;Seated Wrist Extension: AROM;10 reps;Seated Digit Composite Flexion: AROM;10 reps;Seated Composite Extension: AROM;10 reps;Seated Chair Push Up: Strengthening;Other reps (comment)(3) General Exercises - Lower Extremity Long Arc Quad: AAROM;5 reps;Seated Hip Flexion/Marching: AAROM;5 reps;Seated   Shoulder Instructions       General Comments      Pertinent Vitals/ Pain       Pain Assessment: No/denies pain  Home Living                                          Prior  Functioning/Environment              Frequency  Min 2X/week        Progress Toward Goals  OT Goals(current goals can now be found in the care plan section)  Progress towards OT goals: Progressing toward goals  Acute Rehab OT Goals Patient Stated Goal: to get stronger OT Goal Formulation: With patient/family Time For Goal Achievement: 09/25/18 Potential to Achieve Goals: Good ADL Goals Pt Will Perform Grooming: with supervision Pt Will Perform Upper Body Bathing: with supervision Pt Will Perform Lower Body Bathing: with min guard assist Pt Will Perform Upper Body Dressing: with supervision Pt Will Perform Lower Body Dressing: with min guard assist Pt Will Transfer to Toilet: with min guard assist Pt Will Perform Toileting - Clothing Manipulation and hygiene: with min guard assist Pt Will Perform Tub/Shower Transfer: Tub transfer;ambulating;with modified independence Additional ADL Goal #2: Pt will complete DGI with score >19 to demonstrate decr fall risk with adls  Plan Discharge plan needs to be updated    Co-evaluation                 AM-PAC OT "6 Clicks" Daily Activity     Outcome Measure   Help from another person eating meals?: A Little Help from another person taking care of personal grooming?: A Lot Help from another person toileting, which includes using toliet, bedpan, or urinal?: A Lot Help from another person bathing (including washing, rinsing, drying)?: A Lot Help from another person to put on and taking off regular upper body clothing?: A Lot Help from another person to put on and taking off regular lower body clothing?: A Lot 6 Click Score: 13    End of Session Equipment Utilized During Treatment: Gait belt;Rolling walker  OT Visit Diagnosis: Unsteadiness on feet (R26.81);Muscle weakness (generalized) (M62.81)   Activity Tolerance Patient limited by fatigue   Patient Left in chair;with call bell/phone within reach;with chair alarm set;with  family/visitor present   Nurse Communication Mobility status;Precautions        Time: [id number] OT Time Calculation (min): 28 min  Charges: OT General Charges $OT Visit: 1 Visit OT Treatments $Self Care/Home Management : 8-22 mins $Neuromuscular Re-education: 8-22 mins  Darryl Nestle) Marsa Aris OTR/L Acute Rehabilitation Services Pager: [phone] Office: [phone]    Jenene Slicker Jakaylah Schlafer 09/18/2018, 4:20 PM

## 2018-09-18 NOTE — Progress Notes (Signed)
  Subjective:  Interpreter present over the phone. Pt seen at the bedside on rounds this AM. Son was in the room as well. Pt says she feels much better now, but she feels like her feet very heavy. Pt is very nervous about going home because she feels better but is too weak to walk. We explained the treatment can be continued at home and it will be important for her to follow up with Rheumatology in the outpatient setting. Asks for sleep medication. We explained sleep should improve once she is in a comfortable environment at home.   Objective:    Vital Signs (last 24 hours): Vitals:   09/17/18 1627 09/17/18 2120 09/18/18 0055 09/18/18 0448  BP: 127/77 117/63 (!) 127/58 (!) 102/55  Pulse: (!) 102 73 69 73  Resp: 16 16 18 20   Temp: 98.6 F (37 C) 97.8 F (36.6 C) 97.8 F (36.6 C) 97.9 F (36.6 C)  TempSrc: Oral Oral Oral Oral  SpO2: 100% 100% 100% 98%  Weight:      Height:        Physical Exam: General Alert and answers questions appropriately, no acute distress  Cardiac Regular rate and rhythm, no murmurs, rubs, or gallops  Pulmonary Clear to auscultation bilaterally without wheezes, rhonchi, or rales  Extremities No peripheral edema    Assessment/Plan:   Principal Problem:   Dysphagia Active Problems:   Secondary rhabdomyolysis   Adult type dermatomyositis (HCC)   Transaminitis   Elevated liver enzymes   Thrombocytopenia (HCC)   Pressure injury of skin  Patient is a 68 year old female who presented with progressive worsening of dysphagia toward solids and liquids for 4 days prior to admission.  # Dermatomyositis: Clinical picture consistent with dermatomyositis and pathologic biopsy reveals inflammatory myopathy.  EGD did not reveal a structural cause.  Case was discussed with rheumatology who recommended continuing steroids and patient is now status post IVIG x2.  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   * Solu-Medrol 80 mg daily.   Rheumatology had recommended a prolonged 6-week course followed by tapering.  Will discharge on equivalent oral dose with 2-week follow-up with outpatient rheumatology who can manage tapering.  Patient has established care with [facility] rheumatology as an outpatient. * Patient on dysphagia 1 diet with nectar thick fluids.  * Spoke with care management and patient is not a good candidate for SNF placement. Due to lack of insurance and patients immigration status, it will be difficult to find placement. She could be placed at a [facility] affiliated facility but this could take months. PT recommended evaluation by CIR. Consult to CIR placed. Care management will meet with patient's family to discuss ways of managing care if this is not an option. May be able to get short term home health PT for patient.  # Secondary Rhabdomyolysis: AST/ALT elevated to 172/103 on presentation.  CK elevated on presentation and has been downtrending since admission.  # Anemia: Thought to be secondary to persist inflammatory response over months prior to admission.  MCV has fluctuated between macrocytic and normocytic, B12 within normal limits, no iron deficiency, no evidence for hemolysis, and reticulocyte index indicates hypo-proliferation.  # Thrombocytopenia: Continues to improve.  We will continue to trend.  # Dispo: Pending medical improvement and safe plan for discharge/rehabilitation    Jeanmarie Hubert, MD 09/18/2018, 7:35 AM Pager: [phone]

## 2018-09-18 NOTE — Progress Notes (Signed)
Physical Therapy Treatment Patient Details Name: Lisa Mcdowell MRN: [id number] DOB: 04-04-1950 Today's Date: 09/18/2018    History of Present Illness Pt is a 68 y/o female admitted secondary to dysphagia. Pt with cortrack tube in place. Pt also with likely dermatomyositis and is s/p R upper thigh biopsy. Pt started on IVIG during admission. PMH includes GERD and HLD.     PT Comments    Patient seen for mobility progression. Pt's son present and translated during session. Pt is making progress toward PT goals and tolerated gait training distance of 80 ft. Pt continues to require significant assistance for functional transfer training and min A (+2 for safety/chair follow) for gait training. According to pt's son family can provide 24 hour supervision upon d/c. Recommend CIR for further skilled PT services to maximize independence and safety with mobility and likely reach min guard/min A level.     Follow Up Recommendations  CIR     Equipment Recommendations  Rolling walker with 5" wheels    Recommendations for Other Services       Precautions / Restrictions Precautions Precautions: Fall Restrictions Weight Bearing Restrictions: No    Mobility  Bed Mobility               General bed mobility comments: pt OOB in chair upon arrival  Transfers Overall transfer level: Needs assistance Equipment used: Rolling walker (2 wheeled) Transfers: Sit to/from Stand Sit to Stand: Mod assist;+2 physical assistance;Max assist         General transfer comment: cues for safe hand placement and technique; assistance required to power up into standing   Ambulation/Gait Ambulation/Gait assistance: +2 safety/equipment;Min assist(chair follow) Gait Distance (Feet): 80 Feet Assistive device: Rolling walker (2 wheeled) Gait Pattern/deviations: Step-through pattern;Trunk flexed;Decreased stride length Gait velocity: decreased   General Gait Details: heavy reliance on RW; assist to  steady and chair follow for safety; HR up to 129 bpm   Stairs             Wheelchair Mobility    Modified Rankin (Stroke Patients Only)       Balance Overall balance assessment: Needs assistance Sitting-balance support: No upper extremity supported Sitting balance-Leahy Scale: Fair     Standing balance support: Bilateral upper extremity supported;During functional activity Standing balance-Leahy Scale: Poor Standing balance comment: Reliant on BUE support                             Cognition Arousal/Alertness: Awake/alert Behavior During Therapy: WFL for tasks assessed/performed Overall Cognitive Status: Within Functional Limits for tasks assessed                                 General Comments: appears WFL for mobility tasks during session; son present and translated      Exercises      General Comments        Pertinent Vitals/Pain Pain Assessment: No/denies pain    Home Living                      Prior Function            PT Goals (current goals can now be found in the care plan section) Progress towards PT goals: Progressing toward goals    Frequency    Min 2X/week      PT Plan Discharge plan needs to be updated  Co-evaluation              AM-PAC PT "6 Clicks" Mobility   Outcome Measure  Help needed turning from your back to your side while in a flat bed without using bedrails?: A Little Help needed moving from lying on your back to sitting on the side of a flat bed without using bedrails?: A Little Help needed moving to and from a bed to a chair (including a wheelchair)?: A Lot Help needed standing up from a chair using your arms (e.g., wheelchair or bedside chair)?: A Lot Help needed to walk in hospital room?: A Lot Help needed climbing 3-5 steps with a railing? : A Lot 6 Click Score: 14    End of Session Equipment Utilized During Treatment: Gait belt Activity Tolerance: Patient  tolerated treatment well Patient left: in chair;with call bell/phone within reach;with family/visitor present Nurse Communication: Mobility status PT Visit Diagnosis: Unsteadiness on feet (R26.81);Muscle weakness (generalized) (M62.81);Difficulty in walking, not elsewhere classified (R26.2)     Time: [id number] PT Time Calculation (min) (ACUTE ONLY): 29 min  Charges:  $Gait Training: 23-37 mins                     Earney Navy, PTA Acute Rehabilitation Services Pager: [phone] Office: [phone]     Darliss Cheney 09/18/2018, 12:34 PM

## 2018-09-18 NOTE — Progress Notes (Signed)
[facility] called stating the PT will probably go home before going to [facility]. RN will pass on in shift report. No bed is available at this time.

## 2018-09-19 LAB — GLUCOSE, CAPILLARY
Glucose-Capillary: 61 mg/dL — ABNORMAL LOW (ref 70–99)
Glucose-Capillary: 82 mg/dL (ref 70–99)
Glucose-Capillary: 88 mg/dL (ref 70–99)
Glucose-Capillary: 133 mg/dL — ABNORMAL HIGH (ref 70–99)
Glucose-Capillary: 190 mg/dL — ABNORMAL HIGH (ref 70–99)

## 2018-09-19 LAB — CK: Total CK: 872 U/L — ABNORMAL HIGH (ref 38–234)

## 2018-09-19 LAB — APTT: aPTT: 25 seconds (ref 24–36)

## 2018-09-19 MED ORDER — RESOURCE THICKENUP CLEAR PO POWD: ORAL | Status: DC | PRN

## 2018-09-19 NOTE — H&P (Signed)
Physical Medicine and Rehabilitation Admission H&P    Chief Complaint  Patient presents with  . Dysphagia  : HPI: Lisa Mcdowell 68 year old right-handed female with history of GERD and recently diagnosed dermatomyositis was discharged in the hospital 08/18/2018 maintained on prednisone after case has been discussed with Dr.Herrion rheumatology at [facility].  History taken from chart review and family due to language and?  Admission. 1 level home one-step to entry.  Daughter had reported patient independent since most recent discharge.  Presented 08/25/2018 with dysphagia with limited oral intake for 1-2 days.  COVID negative, CK 8335.  Chest x-ray negative.  Ultrasound the abdomen showed hepatic steatosis.  Portal vein was patent.  Elevated CK discussed with rheumatology services felt this would remain elevated for some time even after treatment.  Gastroenterology services consulted.  Feeding tube was placed for nutritional support.  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  EGD completed no endoscopic abnormality evident in the esophagus.  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  Placed on intravenous Protonix.  Diet has been advanced to dysphagia #1 thin liquid diet.  Case was discussed with rheumatology service who recommended continuing steroids and the patient was placed on IVIG transition to p.o. Solu-Medrol.  CK trending down to 872.  Subcutaneous Lovenox for DVT prophylaxis.  Therapy evaluations completed and patient was admitted for a comprehensive rehab program.  Review of Systems  Unable to perform ROS: Language   Past Medical History:  Diagnosis Date  . Cholelithiasis   . Dermatomycosis   . Gallstones 08/08/2010  . GERD (gastroesophageal reflux disease)    pt denies having GERD on day of surgery  . Hyperlipidemia   . Nausea & vomiting    Past Surgical History:  Procedure Laterality Date  . BIOPSY  08/30/2018    Procedure: BIOPSY;  Surgeon: Mauri Pole, MD;  Location: [facility] ENDOSCOPY;  Service: Endoscopy;;  . CHOLECYSTECTOMY  08/08/2010   Procedure: LAPAROSCOPIC CHOLECYSTECTOMY;  Surgeon: Scherry Ran;  Location: [facility] ORS;  Service: General;  Laterality: N/A;  . COLONOSCOPY N/A 04/15/2015   Procedure: COLONOSCOPY;  Surgeon: Danie Binder, MD;  Location: [facility] ENDO SUITE;  Service: Endoscopy;  Laterality: N/A;  1015 - moved to 10:00 - office to notify - interpreter scheduled, do NOT change time  . ESOPHAGOGASTRODUODENOSCOPY (EGD) WITH PROPOFOL N/A 08/30/2018   Procedure: ESOPHAGOGASTRODUODENOSCOPY (EGD) WITH PROPOFOL;  Surgeon: Mauri Pole, MD;  Location: [facility] ENDOSCOPY;  Service: Endoscopy;  Laterality: N/A;  . INSERTION OF MESH N/A 01/23/2018   Procedure: INSERTION OF MESH;  Surgeon: Donnie Mesa, MD;  Location: [facility];  Service: General;  Laterality: N/A;  . MUSCLE BIOPSY Right 09/04/2018   Procedure: RIGHT THIGH MUSCLE BIOPSY;  Surgeon: Erroll Luna, MD;  Location: [facility];  Service: General;  Laterality: Right;  . UMBILICAL HERNIA REPAIR N/A 01/23/2018   Procedure: UMBILICAL HERNIA REPAIR WITH MESH;  Surgeon: Donnie Mesa, MD;  Location: [facility];  Service: General;  Laterality: N/A;  . VARICOSE VEIN SURGERY  2011   [facility]   Family History  Problem Relation Age of Onset  . Colon cancer Neg Hx    Social History:  reports that she has never smoked. She has never used smokeless tobacco. She reports that she does not drink alcohol or use drugs. Allergies: No Active Allergies Medications Prior to Admission  Medication Sig Dispense Refill  . Acetylcysteine (NAC PO) Take 1,000 mg by mouth  3 (three) times daily.    Marland Kitchen amitriptyline (ELAVIL) 25 MG tablet Take 25 mg by mouth at bedtime as needed (muscle and joint pain).     . clobetasol ointment (TEMOVATE) 0.05 % Apply topically 2 (two) times daily. (Patient taking differently: Apply 1 application topically  2 (two) times daily as needed (itching). ) 30 g 0  . doxepin (SINEQUAN) 25 MG capsule Take 25 mg by mouth 3 (three) times daily.    . hydrocortisone cream 1 % Apply topically at bedtime as needed for itching. (Patient taking differently: Apply 1 application topically at bedtime as needed for itching. ) 30 g 0  . hydrOXYzine (ATARAX/VISTARIL) 25 MG tablet Take 1 tablet (25 mg total) by mouth every 8 (eight) hours as needed for itching. 15 tablet 0  . pantoprazole (PROTONIX) 40 MG tablet Take 1 tablet (40 mg total) by mouth daily. 30 tablet 0  . predniSONE (DELTASONE) 10 MG tablet Take 7 tablets (70 mg total) by mouth daily with breakfast. 210 tablet 0  . [EXPIRED] senna-docusate (SENOKOT-S) 8.6-50 MG tablet Take 1 tablet by mouth at bedtime as needed for mild constipation or moderate constipation. 30 tablet 0  . Vitamin D, Ergocalciferol, (DRISDOL) 1.25 MG (50000 UT) CAPS capsule Take 50,000 Units by mouth every Wednesday.      Drug Regimen Review Drug regimen was reviewed and remains appropriate with no significant issues identified  Home: Home Living Family/patient expects to be discharged to:: Private residence Living Arrangements: Children, Spouse/significant other Available Help at Discharge: Family Type of Home: House Home Access: Stairs to enter Technical brewer of Steps: 1 Entrance Stairs-Rails: None Home Layout: One level Bathroom Shower/Tub: Chiropodist: Standard Bathroom Accessibility: Yes Home Equipment: Environmental consultant - 2 wheels, Cane - single point Additional Comments: family purchased DME because a fellow neighbor was going to throw it away. Son works but lives in  the home. husband works nearby but most days patient is home alone   Functional History: Prior Function Level of Independence: Independent Comments: Daughter reports pt was independent with mobility, however, moved very slowly.   Functional Status:  Mobility: Bed Mobility Overal bed  mobility: Needs Assistance Bed Mobility: Supine to Sit Supine to sit: Min assist General bed mobility comments: pt OOB in chair upon arrival Transfers Overall transfer level: Needs assistance Equipment used: Rolling walker (2 wheeled) Transfers: Sit to/from Stand Sit to Stand: Mod assist, Max assist, +2 physical assistance General transfer comment: cues for safe hand placement and technique; assistance required to power up into standing  Ambulation/Gait Ambulation/Gait assistance: +2 safety/equipment, Min assist(chair follow) Gait Distance (Feet): 80 Feet Assistive device: Rolling walker (2 wheeled) Gait Pattern/deviations: Step-through pattern, Trunk flexed, Decreased stride length General Gait Details: heavy reliance on RW; assist to steady and chair follow for safety; HR up to 129 bpm Gait velocity: decreased    ADL: ADL Overall ADL's : Needs assistance/impaired Eating/Feeding: Minimal assistance, Sitting Grooming: Wash/dry hands, Wash/dry face, Oral care, Brushing hair, Minimal assistance, Standing Grooming Details (indicate cue type and reason): tremors in hands- pt required minA to apply toothpaste to brush Upper Body Bathing: Minimal assistance, Sitting Lower Body Bathing: Sit to/from stand, Maximal assistance Upper Body Dressing : Minimal assistance, Sitting Lower Body Dressing: Sit to/from stand, Total assistance Lower Body Dressing Details (indicate cue type and reason): total A to don B shoes Toilet Transfer: Min guard, RW, Ambulation Functional mobility during ADLs: Min guard, Minimal assistance, Rolling walker, Cueing for safety, Cueing for sequencing General ADL  Comments: Pt minA to minguardA with RW as pt unsteady and keeping RW too close. Pt requires assist for power up. Pt education on new exercises to performs when seated in recliner  Cognition: Cognition Overall Cognitive Status: Within Functional Limits for tasks assessed Orientation Level: Oriented X4  Cognition Arousal/Alertness: Awake/alert Behavior During Therapy: WFL for tasks assessed/performed Overall Cognitive Status: Within Functional Limits for tasks assessed General Comments: son present and translated Difficult to assess due to: Non-English speaking  Physical Exam: Blood pressure (!) 97/53, pulse 87, temperature 99.7 F (37.6 C), temperature source Oral, resp. rate 18, height 5' 5.98" (1.676 m), weight 73.2 kg, SpO2 98 %. Physical Exam  Vitals reviewed. Constitutional: She appears well-developed and well-nourished.  HENT:  Head: Normocephalic and atraumatic.  Eyes: EOM are normal.  Neck: No tracheal deviation present. No thyromegaly present.  Respiratory: Effort normal. No respiratory distress.  GI: She exhibits no distension.  Musculoskeletal:     Comments: No edema or tenderness in extremities  Neurological: She is alert.  Patient is alert sitting up in chair.   Makes good eye contact with examiner.   She can repeat some simple words.   Has some difficulty following commands, even with translation Motor: Appears to be bilateral shoulder adduction 2+/5, elbow flexion/extension 4-/5, handgrip 4/5 Bilateral hip flexion 2/5, knee extension 2+/5, ankle dorsiflexion 4+/5 Sensation diminished to light touch bilateral feet  Skin: Skin is warm and dry.  Psychiatric:  Limited due to language, but appears slightly confused    Results for orders placed or performed during the hospital encounter of 08/25/18 (from the past 48 hour(s))  CK     Status: Abnormal   Collection Time: 09/20/18  6:00 AM  Result Value Ref Range   Total CK 543 (H) 38 - 234 U/L    Comment: Performed at [facility], [address], [place], [place] [address]  Glucose, capillary     Status: Abnormal   Collection Time: 09/20/18  6:23 AM  Result Value Ref Range   Glucose-Capillary 67 (L) 70 - 99 mg/dL   Comment 1 Notify RN   Glucose, capillary     Status: None   Collection Time: 09/20/18  6:59  AM  Result Value Ref Range   Glucose-Capillary 80 70 - 99 mg/dL  APTT     Status: None   Collection Time: 09/20/18  7:07 AM  Result Value Ref Range   aPTT 29 24 - 36 seconds    Comment: Performed at [facility], [place] [address], [place], [place] [address]  Glucose, capillary     Status: None   Collection Time: 09/20/18  7:50 AM  Result Value Ref Range   Glucose-Capillary 76 70 - 99 mg/dL  Glucose, capillary     Status: Abnormal   Collection Time: 09/20/18 11:37 AM  Result Value Ref Range   Glucose-Capillary 189 (H) 70 - 99 mg/dL  Glucose, capillary     Status: Abnormal   Collection Time: 09/20/18  4:27 PM  Result Value Ref Range   Glucose-Capillary 205 (H) 70 - 99 mg/dL  Glucose, capillary     Status: None   Collection Time: 09/20/18  9:25 PM  Result Value Ref Range   Glucose-Capillary 91 70 - 99 mg/dL  APTT     Status: None   Collection Time: 09/21/18  5:18 AM  Result Value Ref Range   aPTT 28 24 - 36 seconds    Comment: Performed at [facility],  [address], [place], [place] [address]  Glucose, capillary     Status: Abnormal   Collection Time: 09/21/18 12:26 PM  Result Value Ref Range   Glucose-Capillary 253 (H) 70 - 99 mg/dL   Comment 1 Notify RN    Comment 2 Document in Chart   Glucose, capillary     Status: Abnormal   Collection Time: 09/21/18  4:55 PM  Result Value Ref Range   Glucose-Capillary 212 (H) 70 - 99 mg/dL  Glucose, capillary     Status: Abnormal   Collection Time: 09/21/18  9:15 PM  Result Value Ref Range   Glucose-Capillary 155 (H) 70 - 99 mg/dL  APTT     Status: None   Collection Time: 09/22/18  4:35 AM  Result Value Ref Range   aPTT 27 24 - 36 seconds    Comment: Performed at [facility], [place] [address] [place],  [address]   No results found.  Medical Problem List and Plan: 1.  Decreased functional mobility with dysphasia secondary to dermatomyositis.  Current plan is prolonged course of Solu-Medrol 6 weeks  followed by taper and follow-up rheumatology services Dr [name] at [place] to CIR 2.  Antithrombotics: -DVT/anticoagulation: Subcutaneous Lovenox.    Check vascular study  -antiplatelet therapy: N/A 3. Pain Management: Tylenol as needed 4. Mood: Provide emotional support  -antipsychotic agents: N/A 5. Neuropsych: This patient is?  Fully capable of making decisions on her own behalf. 6. Skin/Wound Care: Routine skin checks 7. Fluids/Electrolytes/Nutrition: Routine I/Os.  CMP ordered for tomorrow a.m. 8.  Dysphagia.  Dysphagia #1 thin liquids.  Follow-up speech therapy.  Advance diet as tolerated 9.  Steroid-induced hyperglycemia: Sliding scale insulin.  Monitor with taper  Cathlyn Parsons, PA-C 09/22/2018

## 2018-09-19 NOTE — Progress Notes (Signed)
  Speech Language Pathology Treatment: Dysphagia  Patient Details Name: Lisa Mcdowell MRN: [id number] DOB: May 01, 1950 Today's Date: 09/19/2018 Time: [id number] SLP Time Calculation (min) (ACUTE ONLY): 20 min  Assessment / Plan / Recommendation Clinical Impression  Pt is tolerated thin liquids with ease today. She is taking straw sips of water without oral holding and no signs of aspiration. Assisted pt in making varied meal choices. She may have thin liquids but also ordered her a can of thickener to have in case she prefers thickened drinks at times, which she says she does. No rush to progress past puree at this time as pt likes the ease of intake and anxiety is an issue. Will f/u next week for possible discussion regarding progression though dentures are not present. Continue to recommend CIR. Pt and daughter are eager for rehabilitation.   HPI HPI: This is a 68 year old female with a PMH significant for dermatomysitis which appears to be a fairly new diagnosis for her. Patient was recently hospitalized for 7/26-7/31 for dermatomyositis and was subsequently treated with IV steroids.  She is discharged with p.o. prednisone.  Patient presented to the ED with a 4 day h/o progressively worsening dysphagia characterized by globus.       SLP Plan  Continue with current plan of care       Recommendations  Diet recommendations: Dysphagia 1 (puree);Thin liquid Medication Administration: Crushed with puree Supervision: Patient able to self feed Compensations: Minimize environmental distractions;Slow rate;Small sips/bites;Follow solids with liquid Postural Changes and/or Swallow Maneuvers: Seated upright 90 degrees                SLP Visit Diagnosis: Dysphagia, oropharyngeal phase (R13.12) Plan: Continue with current plan of care       GO              [name], MA [place] Pager [phone] Office [phone]  Lynann Beaver 09/19/2018, 9:20 AM

## 2018-09-19 NOTE — Progress Notes (Signed)
  Subjective:  Pt seen at the bedside this AM. Pt sitting up in the bedside chair eating breakfast. The patient says she doesn't have pain right now, but is having some trouble sleeping at night. Reassured patient that she will likely sleep better once she goes home. Asked if there was a rehab option here at the hospital. We told the patient we are exploring her options with the help of social work, but there is a good chance we will not have a rehab option and she will have to go home. Pt says she understands.    Objective:    Vital Signs (last 24 hours): Vitals:   09/19/18 0320 09/19/18 0455 09/19/18 0828 09/19/18 1053  BP: 119/60  134/68 133/82  Pulse: 81  81 (!) 109  Resp: 17  16 18   Temp: 98.4 F (36.9 C)   98.6 F (37 C)  TempSrc: Oral   Oral  SpO2: 97%  96% 100%  Weight:  79.7 kg    Height:        Physical Exam: General Alert and answers questions appropriately, no acute distress  Cardiac Regular rate and rhythm, no murmurs, rubs, or gallops  Pulmonary Clear to auscultation bilaterally without wheezes, rhonchi, or rales  Extremities No peripheral edema    Assessment/Plan:   Principal Problem:   Dysphagia Active Problems:   Secondary rhabdomyolysis   Adult type dermatomyositis (HCC)   Transaminitis   Elevated liver enzymes   Thrombocytopenia (HCC)   Pressure injury of skin  Patient is a 68 year old female who presented with progressive worsening of dysphagia toward solids and liquids for 4 days prior to admission.  # Dermatomyositis: Clinical picture consistent with dermatomyositis and pathology logic biopsy reveals inflammatory myopathy.  Case was discussed with rheumatology who recommended continuing steroids and patient is now status post IVIG x2. * Continuing Solu-Medrol 80 mg daily with plans for outpatient tapering by rheumatology * Continuing on dysphagia 1 diet  # Secondary rhabdomyolysis: CK elevated on presentation has been downtrending since admission.   # Dispo:  * Patient unsafe for discharge as she is unable to walk and 24-hour care is not currently available * Patient is being evaluated for CIR, we appreciate their assistance.  Patient may qualify for CIR if she is able to have 24-hour care at home upon discharge.  However, there is currently a gap from 9 AM until 2 PM in the care that her family is able to provide.  Daughter is talking with employer to see if her schedule can be adjusted. Danne Baxter with CIR will meet with family again on Monday at 30 am.   Jeanmarie Hubert, MD 09/19/2018, 12:41 PM Pager: [phone]

## 2018-09-19 NOTE — Progress Notes (Signed)
Inpatient Rehabilitation Admissions Coordinator  I met with pt at bedside as I conference called with her daughter, [name], by phone with grand daughter in law, Danae Chen at bedside. We discussed goals an expectations of an inpt rehab admit. There is a 9 am until 2 pm lack of caregiver support at home. [name] to check with her employer as well as friends this weekend to see if 24/7 care can be arranged at home. We will meet Monday at 10 am again. If 24/7 caregiver support can be arranged at home, we can plan admit to inpt rehab for 7 to 14 days to assist with her rehab needs prior to d/c home with 24/7 care. I have updated RN CM and SW.  Danne Baxter, RN, MSN Rehab Admissions Coordinator [phone] 09/19/2018 12:22 PM

## 2018-09-19 NOTE — TOC Initial Note (Signed)
Transition of Care [facility]) - Initial/Assessment Note    Patient Details  Name: Lisa Mcdowell MRN: [id number] Date of Birth: 12-Nov-1950  [facility]) CM/SW Contact:    Pollie Friar, RN Phone Number: 09/19/2018, 7:54 AM  Clinical Narrative:                 Plan has been for patient to transfer to a tertiary hospital ([facility]). [facility] has not had a bed available and Patient is now doing well enough to d/c to rehab instead of the tertiary hospital.  CM met with the patient and son and went over the recommendations from therapy. Pt doesn't have insurance and is undocumented. SNFs will not accept her except for the SNF affiliated with [facility] and the waiting list is months.  CM encouraged the son to talk with his sister to see if they could come up with a plan to provide assistance to their mother and father in the home. He voiced understanding and will speak with his family.  CIR to assess pt to see if they feel she is appropriate for an admission.  TOC will speak with family again tomorrow about support at home.   Expected Discharge Plan: IP Rehab Facility Barriers to Discharge: Inadequate or no insurance, Other (comment)(pt is undocumented)   Patient Goals and CMS Choice   CMS Medicare.gov Compare Post Acute Care list provided to:: Patient Choice offered to / list presented to : Adult Children, Spouse, Patient  Expected Discharge Plan and Services Expected Discharge Plan: [place] In-house Referral: Clinical Social Work Discharge Planning Services: CM Consult   Living arrangements for the past 2 months: Single Family Home Expected Discharge Date: 09/13/18                                    Prior Living Arrangements/Services Living arrangements for the past 2 months: Single Family Home Lives with:: Spouse Patient language and need for interpreter reviewed:: Yes(pt speaks spanish) Do you feel safe going back to the place where you live?: Yes       Need for Family Participation in Patient Care: Yes (Comment)(24 hour supervision and support needed) Care giver support system in place?: Yes (comment)(spouse is able to provide supervision but may not be able to provide physical support needed)   Criminal Activity/Legal Involvement Pertinent to Current Situation/Hospitalization: No - Comment as needed  Activities of Daily Living Home Assistive Devices/Equipment: None ADL Screening (condition at time of admission) Patient's cognitive ability adequate to safely complete daily activities?: Yes Is the patient deaf or have difficulty hearing?: No Does the patient have difficulty seeing, even when wearing glasses/contacts?: No Does the patient have difficulty concentrating, remembering, or making decisions?: No Patient able to express need for assistance with ADLs?: Yes Does the patient have difficulty dressing or bathing?: No Independently performs ADLs?: Yes (appropriate for developmental age) Does the patient have difficulty walking or climbing stairs?: Yes Weakness of Legs: Both Weakness of Arms/Hands: None  Permission Sought/Granted                  Emotional Assessment Appearance:: Appears stated age   Affect (typically observed): Accepting Orientation: : Oriented to Self, Oriented to Place, Oriented to  Time, Oriented to Situation   Psych Involvement: No (comment)  Admission diagnosis:  Dysphagia [R13.10] Patient Active Problem List   Diagnosis Date Noted  . Pressure injury of skin  09/17/2018  . Thrombocytopenia ([place]) 09/06/2018  . Transaminitis 08/26/2018  . Elevated liver enzymes   . Dysphagia 08/25/2018  . 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 Adult type dermatomyositis ([place]) 08/11/2018  . Encounter for screening colonoscopy 03/24/2015  . Essential tremor 02/06/2015  . Hyperlipidemia 10/28/2014  . GERD (gastroesophageal reflux disease) 10/28/2014  . Hemorrhoids 10/28/2014  . Gallstones 08/08/2010   PCP:   Department, [place]:   [place], [place] - [place] [address] [place] [address] [place] [address] Phone: [phone] Fax: [phone]  [place], [place] - [address] [place] [address] Phone: [phone] Fax: [place], [place] [address] [place] [address] Phone: [phone] Fax: [phone]     Social Determinants of Health (SDOH) Interventions    Readmission Risk Interventions No flowsheet data found.

## 2018-09-19 NOTE — Progress Notes (Signed)
Inpatient Rehabilitation Admissions Coordinator  Inpatient rehab consult received. Pt son or daughter are not at bedside. RN to contact me upon their arrival. If not here by 11 am, I will contact them by phone for rehab discussion.  Danne Baxter, RN, MSN Rehab Admissions Coordinator [phone] 09/19/2018 10:15 AM

## 2018-09-20 LAB — APTT: aPTT: 29 seconds (ref 24–36)

## 2018-09-20 LAB — GLUCOSE, CAPILLARY
Glucose-Capillary: 67 mg/dL — ABNORMAL LOW (ref 70–99)
Glucose-Capillary: 80 mg/dL (ref 70–99)
Glucose-Capillary: 76 mg/dL (ref 70–99)
Glucose-Capillary: 189 mg/dL — ABNORMAL HIGH (ref 70–99)
Glucose-Capillary: 205 mg/dL — ABNORMAL HIGH (ref 70–99)
Glucose-Capillary: 91 mg/dL (ref 70–99)

## 2018-09-20 LAB — CK: Total CK: 543 U/L — ABNORMAL HIGH (ref 38–234)

## 2018-09-20 NOTE — Progress Notes (Addendum)
   Subjective: A Spanish interpreter was used for today's evaluation. The patient stated that her legs continue to be very week and that she is unable to stand or use them to transition. She has been able to tolerate some advancement in her diet to solid foods and if feeling improved overall. She is in agreement with the plan for CIR on Monday to continue rehabilitation therapy.   Objective:  Vital signs in last 24 hours: Vitals:   09/19/18 2051 09/20/18 0008 09/20/18 0415 09/20/18 0518  BP: 117/70 113/69 111/63   Pulse: 74 80 75   Resp: 16 18 18    Temp: 98.6 F (37 C) 98.5 F (36.9 C) 97.7 F (36.5 C)   TempSrc:  Oral Oral   SpO2: 99% 98% 97%   Weight:    80 kg  Height:       General: A/O x4, in no acute distress, afebrile, nondiaphoretic HEENT: PEERL, EMO intact Cardio: RRR, no mrg's  Abdomen: Bowel sounds normal, soft, nontender  MSK: BLE nontender, nonedematous Psych: Appropriate affect, not depressed in appearance, engages well  Assessment/Plan:  Principal Problem:   Dysphagia Active Problems:   Secondary rhabdomyolysis   Adult type dermatomyositis (HCC)   Transaminitis   Elevated liver enzymes   Thrombocytopenia (HCC)   Pressure injury of skin  The patient is a 68 year old female who was initially admitted for progressively worsening dysphagia that is secondary to dermatomyositis.  She continues to progress daily with regard to her ability to tolerate oral intake following treatment with IVIG and corticosteroids.  However, she continues to have notable bilateral lower extremity weakness that is brief in her home ambulating without maximal assist.  A/P: Dermatomyositis: Improving.  Patient's dysphagia is markedly improved and she is now able to tolerate fluids and some solids.  She is to follow with rheumatology on discharge and will continue her high-dose steroids until that time. -Continuously Medrol 8 mg daily - Continue on dysphagia 1 diet advancing as tolerated  Rhabdomyolysis: CK 543, we will discontinue the trend.  Patient remains asymptomatic from this and is high as far below what typically results and renal insufficiency.  Diet: Dysphagia 1 Code: Full DVT prophylaxis: Enoxaparin Fluids: N/A  Dispo: Anticipated discharge in approximately 2-3 day(s).   Kathi Ludwig, MD 09/20/2018, 6:51 AM Pager: # [phone]

## 2018-09-21 LAB — GLUCOSE, CAPILLARY
Glucose-Capillary: 253 mg/dL — ABNORMAL HIGH (ref 70–99)
Glucose-Capillary: 212 mg/dL — ABNORMAL HIGH (ref 70–99)
Glucose-Capillary: 155 mg/dL — ABNORMAL HIGH (ref 70–99)

## 2018-09-21 LAB — APTT: aPTT: 28 seconds (ref 24–36)

## 2018-09-21 MED ORDER — POLYETHYLENE GLYCOL 3350 17 G PO PACK
17.0000 g | PACK | Freq: Every day | ORAL | Status: DC | PRN
  Administered 2018-09-21: 17 g via ORAL
  Filled 2018-09-21: qty 1

## 2018-09-21 MED ORDER — POLYETHYLENE GLYCOL 3350 17 G PO PACK: 17.0000 g | PACK | Freq: Every day | ORAL | Status: DC

## 2018-09-21 NOTE — Progress Notes (Signed)
  Subjective:  Patient reports that her swallowing is continuing to improve and that she was able to drink more water today.  Patient reports continued weakness in her arms and legs and is hopeful to be placed in rehab.  Spoke with patient's daughter on phone who asked about developments with [facility] transfer that she heard about from RN.  Informed daughter that while there is availability at [facility], patient likely does not need another inpatient hospitalization and doing so might further delay getting patient placed in rehab.  Daughter was comfortable with this plan and appreciative of the explanation.  Daughter stated that she was able to rearrange her schedule such that they will be able to have 24-hour supervision of patient when she is discharged.   Objective:    Vital Signs (last 24 hours): Vitals:   09/21/18 0015 09/21/18 0339 09/21/18 0500 09/21/18 0847  BP: 111/68 (!) 101/58  98/68  Pulse: 75 75  76  Resp: 20 18  17   Temp: 98 F (36.7 C) 98.2 F (36.8 C)  97.9 F (36.6 C)  TempSrc: Oral   Oral  SpO2: 100% 97%  98%  Weight:   74.4 kg   Height:        Physical Exam: General Alert and answers questions appropriately, no acute distress  Cardiac Regular rate and rhythm, no murmurs, rubs, or gallops  Abdominal Soft, non-tender, without distention  Extremities No peripheral edema     Assessment/Plan:   Principal Problem:   Dysphagia Active Problems:   Secondary rhabdomyolysis   Adult type dermatomyositis (HCC)   Transaminitis   Elevated liver enzymes   Thrombocytopenia (HCC)   Pressure injury of skin  Patient is a 68 year old female who presented with progressive worsening of dysphagia toward solids and liquids for 4 days prior to admission.  Patient continues to have progressive improvement in swallowing and oral intake, continuing treatment with corticosteroids.  # Dermatomyositis: Dysphasia markedly improved able to better tolerate fluids and solids.  We will continue  high-dose steroids with short-term outpatient rheumatology follow-up who will manage taper. * Continue Solu-Medrol 80 mg daily * Dysphasia 1 diet, advance as tolerated * Patient's daughter was able to adjust schedule such that family will be able to provide 24-hour supervision when at home.  CIR will meet with patient again on Monday with hope of finding placement.  # Rhabdomyolysis: CK was elevated on presentation and has down trended during admission.  Patient without new symptoms  Dispo: Anticipated discharge in approximately 2-3 day(s).   Jeanmarie Hubert, MD 09/21/2018, 9:52 AM Pager: [phone]

## 2018-09-21 NOTE — Progress Notes (Signed)
At the said time, RN received a phone call from Barnetta Chapel, (an RN)from [facility] notifying me about patients availability of bed. Needed direction given to help in transferring patient to [facility]. On call provider, patient and her daughter aware of the availability of bed in [place].

## 2018-09-22 ENCOUNTER — Encounter (HOSPITAL_COMMUNITY): Payer: Self-pay | Admitting: *Deleted

## 2018-09-22 ENCOUNTER — Other Ambulatory Visit: Payer: Self-pay

## 2018-09-22 ENCOUNTER — Inpatient Hospital Stay (HOSPITAL_COMMUNITY)
Admission: RE | Admit: 2018-09-22 | Discharge: 2018-10-06 | DRG: 546 | Disposition: A | Discharge status: Home Health | Payer: Self-pay | Source: Intra-hospital | Attending: Physical Medicine and Rehabilitation | Admitting: Physical Medicine and Rehabilitation

## 2018-09-22 DIAGNOSIS — Z7952 Long term (current) use of systemic steroids: Secondary | ICD-10-CM

## 2018-09-22 DIAGNOSIS — R739 Hyperglycemia, unspecified: Secondary | ICD-10-CM

## 2018-09-22 DIAGNOSIS — R7309 Other abnormal glucose: Secondary | ICD-10-CM

## 2018-09-22 DIAGNOSIS — R0989 Other specified symptoms and signs involving the circulatory and respiratory systems: Secondary | ICD-10-CM

## 2018-09-22 DIAGNOSIS — M3390 Dermatopolymyositis, unspecified, organ involvement unspecified: Secondary | ICD-10-CM | POA: Diagnosis present

## 2018-09-22 DIAGNOSIS — M6282 Rhabdomyolysis: Secondary | ICD-10-CM | POA: Diagnosis present

## 2018-09-22 DIAGNOSIS — R1310 Dysphagia, unspecified: Secondary | ICD-10-CM | POA: Diagnosis present

## 2018-09-22 DIAGNOSIS — G479 Sleep disorder, unspecified: Secondary | ICD-10-CM | POA: Diagnosis present

## 2018-09-22 DIAGNOSIS — Z23 Encounter for immunization: Secondary | ICD-10-CM

## 2018-09-22 DIAGNOSIS — R7401 Elevation of levels of liver transaminase levels: Secondary | ICD-10-CM | POA: Diagnosis present

## 2018-09-22 DIAGNOSIS — K5900 Constipation, unspecified: Secondary | ICD-10-CM | POA: Diagnosis not present

## 2018-09-22 DIAGNOSIS — T380X5A Adverse effect of glucocorticoids and synthetic analogues, initial encounter: Secondary | ICD-10-CM | POA: Diagnosis present

## 2018-09-22 DIAGNOSIS — E876 Hypokalemia: Secondary | ICD-10-CM | POA: Diagnosis not present

## 2018-09-22 DIAGNOSIS — M3312 Other dermatopolymyositis with myopathy: Principal | ICD-10-CM | POA: Diagnosis present

## 2018-09-22 DIAGNOSIS — K219 Gastro-esophageal reflux disease without esophagitis: Secondary | ICD-10-CM | POA: Diagnosis present

## 2018-09-22 DIAGNOSIS — R4702 Dysphasia: Secondary | ICD-10-CM | POA: Diagnosis present

## 2018-09-22 DIAGNOSIS — D62 Acute posthemorrhagic anemia: Secondary | ICD-10-CM | POA: Diagnosis present

## 2018-09-22 DIAGNOSIS — E785 Hyperlipidemia, unspecified: Secondary | ICD-10-CM | POA: Diagnosis present

## 2018-09-22 DIAGNOSIS — K760 Fatty (change of) liver, not elsewhere classified: Secondary | ICD-10-CM | POA: Diagnosis present

## 2018-09-22 DIAGNOSIS — E1165 Type 2 diabetes mellitus with hyperglycemia: Secondary | ICD-10-CM | POA: Diagnosis present

## 2018-09-22 LAB — GLUCOSE, CAPILLARY
Glucose-Capillary: 76 mg/dL (ref 70–99)
Glucose-Capillary: 251 mg/dL — ABNORMAL HIGH (ref 70–99)
Glucose-Capillary: 212 mg/dL — ABNORMAL HIGH (ref 70–99)
Glucose-Capillary: 160 mg/dL — ABNORMAL HIGH (ref 70–99)

## 2018-09-22 LAB — CBC
HCT: 33.2 % — ABNORMAL LOW (ref 36.0–46.0)
Hemoglobin: 10.7 g/dL — ABNORMAL LOW (ref 12.0–15.0)
MCH: 33.4 pg (ref 26.0–34.0)
MCHC: 32.2 g/dL (ref 30.0–36.0)
MCV: 103.8 fL — ABNORMAL HIGH (ref 80.0–100.0)
Platelets: 180 10*3/uL (ref 150–400)
RBC: 3.2 MIL/uL — ABNORMAL LOW (ref 3.87–5.11)
RDW: 16.7 % — ABNORMAL HIGH (ref 11.5–15.5)
WBC: 7.1 10*3/uL (ref 4.0–10.5)
nRBC: 0.4 % — ABNORMAL HIGH (ref 0.0–0.2)

## 2018-09-22 LAB — APTT: aPTT: 27 seconds (ref 24–36)

## 2018-09-22 LAB — CREATININE, SERUM
Creatinine, Ser: 0.49 mg/dL (ref 0.44–1.00)
GFR calc Af Amer: >60 mL/min (ref >60)
GFR calc non Af Amer: >60 mL/min (ref >60)

## 2018-09-22 MED ORDER — PNEUMOCOCCAL VAC POLYVALENT 25 MCG/0.5ML IJ INJ: 0.5000 mL | INJECTION | INTRAMUSCULAR | Status: DC

## 2018-09-22 MED ORDER — PANTOPRAZOLE SODIUM 40 MG PO PACK: 40.0000 mg | PACK | Freq: Two times a day (BID) | ORAL | Status: DC

## 2018-09-22 MED ORDER — ACETAMINOPHEN 160 MG/5ML PO SOLN
500.0000 mg | Freq: Four times a day (QID) | ORAL | Status: DC | PRN
  Administered 2018-09-22 – 2018-10-01 (×2): 500 mg via ORAL
  Filled 2018-09-22 (×2): qty 20.3

## 2018-09-22 MED ORDER — ENSURE ENLIVE PO LIQD
237.0000 mL | Freq: Two times a day (BID) | ORAL | Status: DC
  Administered 2018-09-22 – 2018-10-05 (×20): 237 mL via ORAL

## 2018-09-22 MED ORDER — ONDANSETRON HCL 4 MG PO TABS: 4.0000 mg | ORAL_TABLET | Freq: Four times a day (QID) | ORAL | 0 refills | Status: DC | PRN

## 2018-09-22 MED ORDER — HYDROXYZINE HCL 25 MG PO TABS: 25.0000 mg | ORAL_TABLET | Freq: Every evening | ORAL | 0 refills | Status: DC | PRN

## 2018-09-22 MED ORDER — PANTOPRAZOLE SODIUM 40 MG PO PACK
40.0000 mg | PACK | Freq: Two times a day (BID) | ORAL | Status: DC
  Administered 2018-09-22 – 2018-09-30 (×16): 40 mg via ORAL
  Filled 2018-09-22 (×16): qty 20

## 2018-09-22 MED ORDER — PHENOL 1.4 % MT LIQD: 1.0000 | OROMUCOSAL | 0 refills | Status: DC | PRN

## 2018-09-22 MED ORDER — METHYLPREDNISOLONE SODIUM SUCC 125 MG IJ SOLR: 80.0000 mg | INTRAMUSCULAR | Status: DC

## 2018-09-22 MED ORDER — POLYETHYLENE GLYCOL 3350 17 G PO PACK: 17.0000 g | PACK | Freq: Every day | ORAL | 0 refills | Status: DC | PRN

## 2018-09-22 MED ORDER — ENOXAPARIN SODIUM 40 MG/0.4ML ~~LOC~~ SOLN
40.0000 mg | SUBCUTANEOUS | Status: DC
  Administered 2018-09-23 – 2018-10-05 (×13): 40 mg via SUBCUTANEOUS
  Filled 2018-09-22 (×13): qty 0.4

## 2018-09-22 MED ORDER — SORBITOL 70 % SOLN
30.0000 mL | Freq: Every day | Status: DC | PRN
  Administered 2018-09-26 – 2018-09-28 (×2): 30 mL via ORAL
  Filled 2018-09-22 (×2): qty 30

## 2018-09-22 MED ORDER — INSULIN ASPART 100 UNIT/ML ~~LOC~~ SOLN: 0.0000 [IU] | Freq: Three times a day (TID) | SUBCUTANEOUS | 11 refills | Status: DC

## 2018-09-22 MED ORDER — HYDROCORTISONE 1 % EX CREA: TOPICAL_CREAM | CUTANEOUS | 0 refills | Status: DC | PRN

## 2018-09-22 MED ORDER — RESOURCE THICKENUP CLEAR PO POWD: 1.0000 | ORAL | Status: DC | PRN

## 2018-09-22 MED ORDER — INSULIN ASPART 100 UNIT/ML ~~LOC~~ SOLN
0.0000 [IU] | Freq: Three times a day (TID) | SUBCUTANEOUS | Status: DC
  Administered 2018-09-22: 3 [IU] via SUBCUTANEOUS
  Administered 2018-09-23 – 2018-09-25 (×5): 2 [IU] via SUBCUTANEOUS
  Administered 2018-09-25: 3 [IU] via SUBCUTANEOUS
  Administered 2018-09-26 – 2018-09-27 (×3): 2 [IU] via SUBCUTANEOUS
  Administered 2018-09-27: 3 [IU] via SUBCUTANEOUS
  Administered 2018-09-28: 1 [IU] via SUBCUTANEOUS
  Administered 2018-09-28: 2 [IU] via SUBCUTANEOUS
  Administered 2018-09-29: 3 [IU] via SUBCUTANEOUS
  Administered 2018-09-29 – 2018-10-02 (×7): 2 [IU] via SUBCUTANEOUS
  Administered 2018-10-03 (×2): 3 [IU] via SUBCUTANEOUS
  Administered 2018-10-04: 2 [IU] via SUBCUTANEOUS
  Administered 2018-10-04: 13:00:00 3 [IU] via SUBCUTANEOUS
  Administered 2018-10-05: 2 [IU] via SUBCUTANEOUS
  Administered 2018-10-05: 3 [IU] via SUBCUTANEOUS

## 2018-09-22 MED ORDER — ENOXAPARIN SODIUM 40 MG/0.4ML ~~LOC~~ SOLN: 40.0000 mg | SUBCUTANEOUS | Status: DC

## 2018-09-22 MED ORDER — HYDROCORTISONE 1 % EX CREA
1.0000 "application " | TOPICAL_CREAM | CUTANEOUS | Status: DC | PRN
  Filled 2018-09-22: qty 28

## 2018-09-22 MED ORDER — ENSURE ENLIVE PO LIQD: 237.0000 mL | Freq: Two times a day (BID) | ORAL | 12 refills | Status: DC

## 2018-09-22 MED ORDER — POLYETHYLENE GLYCOL 3350 17 G PO PACK
17.0000 g | PACK | Freq: Every day | ORAL | Status: DC | PRN
  Administered 2018-09-23 – 2018-10-05 (×2): 17 g via ORAL
  Filled 2018-09-22 (×3): qty 1

## 2018-09-22 MED ORDER — ACETAMINOPHEN 160 MG/5ML PO SOLN: 500.0000 mg | Freq: Four times a day (QID) | ORAL | 0 refills | Status: DC | PRN

## 2018-09-22 MED ORDER — PNEUMOCOCCAL VAC POLYVALENT 25 MCG/0.5ML IJ INJ
0.5000 mL | INJECTION | INTRAMUSCULAR | Status: AC
  Administered 2018-09-24: 0.5 mL via INTRAMUSCULAR
  Filled 2018-09-22: qty 0.5

## 2018-09-22 MED ORDER — ONDANSETRON HCL 4 MG/2ML IJ SOLN: 4.0000 mg | Freq: Four times a day (QID) | INTRAMUSCULAR | Status: DC | PRN

## 2018-09-22 MED ORDER — INSULIN ASPART 100 UNIT/ML ~~LOC~~ SOLN: 0.0000 [IU] | Freq: Every day | SUBCUTANEOUS | 11 refills | Status: DC

## 2018-09-22 MED ORDER — ONDANSETRON HCL 4 MG PO TABS: 4.0000 mg | ORAL_TABLET | Freq: Four times a day (QID) | ORAL | Status: DC | PRN

## 2018-09-22 MED ORDER — PREDNISONE 50 MG PO TABS: 100.0000 mg | ORAL_TABLET | ORAL | Status: DC

## 2018-09-22 MED ORDER — PRO-STAT SUGAR FREE PO LIQD
30.0000 mL | Freq: Every day | ORAL | Status: DC
  Administered 2018-09-22 – 2018-10-06 (×15): 30 mL via ORAL
  Filled 2018-09-22 (×15): qty 30

## 2018-09-22 MED ORDER — ORAL CARE MOUTH RINSE
15.0000 mL | Freq: Two times a day (BID) | OROMUCOSAL | Status: DC
  Administered 2018-09-22 – 2018-10-06 (×25): 15 mL via OROMUCOSAL

## 2018-09-22 MED ORDER — PRO-STAT SUGAR FREE PO LIQD: 30.0000 mL | Freq: Every day | ORAL | 0 refills | Status: DC

## 2018-09-22 NOTE — TOC Transition Note (Signed)
Transition of Care [facility]) - CM/SW Discharge Note   Patient Details  Name: KELLIJO LEGATE MRN: [id number] Date of Birth: 1950-06-30  [facility]) CM/SW Contact:  Pollie Friar, RN Phone Number: 09/22/2018, 11:55 AM   Clinical Narrative:    Pt discharging to CIR today. CM signing off.    Final next level of care: IP Rehab Facility Barriers to Discharge: Inadequate or no insurance, Barriers Unresolved (comment)   Patient Goals and CMS Choice   CMS Medicare.gov Compare Post Acute Care list provided to:: Patient Choice offered to / list presented to : Adult Children, Spouse, Patient  Discharge Placement                       Discharge Plan and Services In-house Referral: Clinical Social Work Discharge Planning Services: CM Consult                                 Social Determinants of Health (SDOH) Interventions     Readmission Risk Interventions No flowsheet data found.

## 2018-09-22 NOTE — H&P (Signed)
Physical Medicine and Rehabilitation Admission H&P    Chief Complaint  Patient presents with  . Dysphagia  : HPI: Lisa Mcdowell 68 year old right-handed female with history of GERD and recently diagnosed dermatomyositis was discharged in the hospital 08/18/2018 maintained on prednisone after case has been discussed with Dr.Herrion rheumatology at [facility].  History taken from chart review and family due to language and?  Admission. 1 level home one-step to entry.  Daughter had reported patient independent since most recent discharge.  Presented 08/25/2018 with dysphagia with limited oral intake for 1-2 days.  COVID negative, CK 8335.  Chest x-ray negative.  Ultrasound the abdomen showed hepatic steatosis.  Portal vein was patent.  Elevated CK discussed with rheumatology services felt this would remain elevated for some time even after treatment.  Gastroenterology services consulted.  Feeding tube was placed for nutritional support.  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  EGD completed no endoscopic abnormality evident in the esophagus.  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  Placed on intravenous Protonix.  Diet has been advanced to dysphagia #1 thin liquid diet.  Case was discussed with rheumatology service who recommended continuing steroids and the patient was placed on IVIG transition to p.o. Solu-Medrol.  CK trending down to 872.  Subcutaneous Lovenox for DVT prophylaxis.  Therapy evaluations completed and patient was admitted for a comprehensive rehab program.  Review of Systems  Unable to perform ROS: Language   Past Medical History:  Diagnosis Date  . Cholelithiasis   . Dermatomycosis   . Gallstones 08/08/2010  . GERD (gastroesophageal reflux disease)    pt denies having GERD on day of surgery  . Hyperlipidemia   . Nausea & vomiting    Past Surgical History:  Procedure Laterality Date  . BIOPSY  08/30/2018    Procedure: BIOPSY;  Surgeon: Mauri Pole, MD;  Location[facility] ENDOSCOPY;  Service: Endoscopy;;  . CHOLECYSTECTOMY  08/08/2010   Procedure: LAPAROSCOPIC CHOLECYSTECTOMY;  Surgeon: Scherry Ran;  Location: [facility] ORS;  Service: General;  Laterality: N/A;  . COLONOSCOPY N/A 04/15/2015   Procedure: COLONOSCOPY;  Surgeon: Danie Binder, MD;  Location: [facility] ENDO SUITE;  Service: Endoscopy;  Laterality: N/A;  1015 - moved to 10:00 - office to notify - interpreter scheduled, do NOT change time  . ESOPHAGOGASTRODUODENOSCOPY (EGD) WITH PROPOFOL N/A 08/30/2018   Procedure: ESOPHAGOGASTRODUODENOSCOPY (EGD) WITH PROPOFOL;  Surgeon: Mauri Pole, MD;  Location: [facility] ENDOSCOPY;  Service: Endoscopy;  Laterality: N/A;  . INSERTION OF MESH N/A 01/23/2018   Procedure: INSERTION OF MESH;  Surgeon: Donnie Mesa, MD;  Location: [place];  Service: General;  Laterality: N/A;  . MUSCLE BIOPSY Right 09/04/2018   Procedure: RIGHT THIGH MUSCLE BIOPSY;  Surgeon: Erroll Luna, MD;  Location: [place];  Service: General;  Laterality: Right;  . UMBILICAL HERNIA REPAIR N/A 01/23/2018   Procedure: UMBILICAL HERNIA REPAIR WITH MESH;  Surgeon: Donnie Mesa, MD;  Location: [place];  Service: General;  Laterality: N/A;  . VARICOSE VEIN SURGERY  2011   [facility]   Family History  Problem Relation Age of Onset  . Colon cancer Neg Hx    Social History:  reports that she has never smoked. She has never used smokeless tobacco. She reports that she does not drink alcohol or use drugs. Allergies: No Active Allergies Medications Prior to Admission  Medication Sig Dispense Refill  . Acetylcysteine (NAC PO) Take 1,000 mg by mouth  3 (three) times daily.    Marland Kitchen amitriptyline (ELAVIL) 25 MG tablet Take 25 mg by mouth at bedtime as needed (muscle and joint pain).     . clobetasol ointment (TEMOVATE) 0.05 % Apply topically 2 (two) times daily. (Patient taking differently: Apply 1 application topically  2 (two) times daily as needed (itching). ) 30 g 0  . doxepin (SINEQUAN) 25 MG capsule Take 25 mg by mouth 3 (three) times daily.    . hydrocortisone cream 1 % Apply topically at bedtime as needed for itching. (Patient taking differently: Apply 1 application topically at bedtime as needed for itching. ) 30 g 0  . hydrOXYzine (ATARAX/VISTARIL) 25 MG tablet Take 1 tablet (25 mg total) by mouth every 8 (eight) hours as needed for itching. 15 tablet 0  . pantoprazole (PROTONIX) 40 MG tablet Take 1 tablet (40 mg total) by mouth daily. 30 tablet 0  . predniSONE (DELTASONE) 10 MG tablet Take 7 tablets (70 mg total) by mouth daily with breakfast. 210 tablet 0  . [EXPIRED] senna-docusate (SENOKOT-S) 8.6-50 MG tablet Take 1 tablet by mouth at bedtime as needed for mild constipation or moderate constipation. 30 tablet 0  . Vitamin D, Ergocalciferol, (DRISDOL) 1.25 MG (50000 UT) CAPS capsule Take 50,000 Units by mouth every Wednesday.      Drug Regimen Review Drug regimen was reviewed and remains appropriate with no significant issues identified  Home: Home Living Family/patient expects to be discharged to:: Private residence Living Arrangements: Children, Spouse/significant other Available Help at Discharge: Family Type of Home: House Home Access: Stairs to enter Technical brewer of Steps: 1 Entrance Stairs-Rails: None Home Layout: One level Bathroom Shower/Tub: Chiropodist: Standard Bathroom Accessibility: Yes Home Equipment: Environmental consultant - 2 wheels, Cane - single point Additional Comments: family purchased DME because a fellow neighbor was going to throw it away. Son works but lives in  the home. husband works nearby but most days patient is home alone   Functional History: Prior Function Level of Independence: Independent Comments: Daughter reports pt was independent with mobility, however, moved very slowly.   Functional Status:  Mobility: Bed Mobility Overal bed  mobility: Needs Assistance Bed Mobility: Supine to Sit Supine to sit: Min assist General bed mobility comments: pt OOB in chair upon arrival Transfers Overall transfer level: Needs assistance Equipment used: Rolling walker (2 wheeled) Transfers: Sit to/from Stand Sit to Stand: Mod assist, Max assist, +2 physical assistance General transfer comment: cues for safe hand placement and technique; assistance required to power up into standing  Ambulation/Gait Ambulation/Gait assistance: +2 safety/equipment, Min assist(chair follow) Gait Distance (Feet): 80 Feet Assistive device: Rolling walker (2 wheeled) Gait Pattern/deviations: Step-through pattern, Trunk flexed, Decreased stride length General Gait Details: heavy reliance on RW; assist to steady and chair follow for safety; HR up to 129 bpm Gait velocity: decreased    ADL: ADL Overall ADL's : Needs assistance/impaired Eating/Feeding: Minimal assistance, Sitting Grooming: Wash/dry hands, Wash/dry face, Oral care, Brushing hair, Minimal assistance, Standing Grooming Details (indicate cue type and reason): tremors in hands- pt required minA to apply toothpaste to brush Upper Body Bathing: Minimal assistance, Sitting Lower Body Bathing: Sit to/from stand, Maximal assistance Upper Body Dressing : Minimal assistance, Sitting Lower Body Dressing: Sit to/from stand, Total assistance Lower Body Dressing Details (indicate cue type and reason): total A to don B shoes Toilet Transfer: Min guard, RW, Ambulation Functional mobility during ADLs: Min guard, Minimal assistance, Rolling walker, Cueing for safety, Cueing for sequencing General ADL  Comments: Pt minA to minguardA with RW as pt unsteady and keeping RW too close. Pt requires assist for power up. Pt education on new exercises to performs when seated in recliner  Cognition: Cognition Overall Cognitive Status: Within Functional Limits for tasks assessed Orientation Level: Oriented X4  Cognition Arousal/Alertness: Awake/alert Behavior During Therapy: WFL for tasks assessed/performed Overall Cognitive Status: Within Functional Limits for tasks assessed General Comments: son present and translated Difficult to assess due to: Non-English speaking  Physical Exam: Blood pressure (!) 97/53, pulse 87, temperature 99.7 F (37.6 C), temperature source Oral, resp. rate 18, height 5' 5.98" (1.676 m), weight 73.2 kg, SpO2 98 %. Physical Exam  Vitals reviewed. Constitutional: She appears well-developed and well-nourished.  HENT:  Head: Normocephalic and atraumatic.  Eyes: EOM are normal.  Neck: No tracheal deviation present. No thyromegaly present.  Respiratory: Effort normal. No respiratory distress.  GI: She exhibits no distension.  Musculoskeletal:     Comments: No edema or tenderness in extremities  Neurological: She is alert.  Patient is alert sitting up in chair.   Makes good eye contact with examiner.   She can repeat some simple words.   Has some difficulty following commands, even with translation Motor: Appears to be bilateral shoulder adduction 2+/5, elbow flexion/extension 4-/5, handgrip 4/5 Bilateral hip flexion 2/5, knee extension 2+/5, ankle dorsiflexion 4+/5 Sensation diminished to light touch bilateral feet  Skin: Skin is warm and dry.  Psychiatric:  Limited due to language, but appears slightly confused    Results for orders placed or performed during the hospital encounter of 08/25/18 (from the past 48 hour(s))  CK     Status: Abnormal   Collection Time: 09/20/18  6:00 AM  Result Value Ref Range   Total CK 543 (H) 38 - 234 U/L    Comment: Performed at [facility], [address], [place], [place] [address]  Glucose, capillary     Status: Abnormal   Collection Time: 09/20/18  6:23 AM  Result Value Ref Range   Glucose-Capillary 67 (L) 70 - 99 mg/dL   Comment 1 Notify RN   Glucose, capillary     Status: None   Collection Time: 09/20/18  6:59  AM  Result Value Ref Range   Glucose-Capillary 80 70 - 99 mg/dL  APTT     Status: None   Collection Time: 09/20/18  7:07 AM  Result Value Ref Range   aPTT 29 24 - 36 seconds    Comment: Performed at [facility], [place] [address] [place], [place] [address]  Glucose, capillary     Status: None   Collection Time: 09/20/18  7:50 AM  Result Value Ref Range   Glucose-Capillary 76 70 - 99 mg/dL  Glucose, capillary     Status: Abnormal   Collection Time: 09/20/18 11:37 AM  Result Value Ref Range   Glucose-Capillary 189 (H) 70 - 99 mg/dL  Glucose, capillary     Status: Abnormal   Collection Time: 09/20/18  4:27 PM  Result Value Ref Range   Glucose-Capillary 205 (H) 70 - 99 mg/dL  Glucose, capillary     Status: None   Collection Time: 09/20/18  9:25 PM  Result Value Ref Range   Glucose-Capillary 91 70 - 99 mg/dL  APTT     Status: None   Collection Time: 09/21/18  5:18 AM  Result Value Ref Range   aPTT 28 24 - 36 seconds    Comment: Performed at [facility],  [address], [place], [place] [address]  Glucose, capillary     Status: Abnormal   Collection Time: 09/21/18 12:26 PM  Result Value Ref Range   Glucose-Capillary 253 (H) 70 - 99 mg/dL   Comment 1 Notify RN    Comment 2 Document in Chart   Glucose, capillary     Status: Abnormal   Collection Time: 09/21/18  4:55 PM  Result Value Ref Range   Glucose-Capillary 212 (H) 70 - 99 mg/dL  Glucose, capillary     Status: Abnormal   Collection Time: 09/21/18  9:15 PM  Result Value Ref Range   Glucose-Capillary 155 (H) 70 - 99 mg/dL  APTT     Status: None   Collection Time: 09/22/18  4:35 AM  Result Value Ref Range   aPTT 27 24 - 36 seconds    Comment: Performed at [facility], [place] [address] [place], [place] [address]   No results found.  Medical Problem List and Plan: 1.  Decreased functional mobility with dysphasia secondary to dermatomyositis.  Current plan is prolonged course of Solu-Medrol 6 weeks  followed by taper and follow-up rheumatology services Dr [name] at [facility] to CIR 2.  Antithrombotics: -DVT/anticoagulation: Subcutaneous Lovenox.    Check vascular study  -antiplatelet therapy: N/A 3. Pain Management: Tylenol as needed 4. Mood: Provide emotional support  -antipsychotic agents: N/A 5. Neuropsych: This patient is?  Fully capable of making decisions on her own behalf. 6. Skin/Wound Care: Routine skin checks 7. Fluids/Electrolytes/Nutrition: Routine I/Os.  CMP ordered for tomorrow a.m. 8.  Dysphagia.  Dysphagia #1 thin liquids.  Follow-up speech therapy.  Advance diet as tolerated 9.  Steroid-induced hyperglycemia: Sliding scale insulin.  Monitor with taper  Cathlyn Parsons, PA-C 09/22/2018 Delice Lesch, MD, ABPMR

## 2018-09-22 NOTE — Progress Notes (Signed)
  Subjective:  Patient seen in chair this morning.  Patient reports continued improvement and reports that she feels well.  Discussed that CIR would be meeting with patient to evaluate for rehab.   Objective:    Vital Signs (last 24 hours): Vitals:   09/21/18 2350 09/22/18 0410 09/22/18 0433 09/22/18 0854  BP: (!) 104/59 (!) 97/53  129/61  Pulse: 80 87  (!) 105  Resp: 18 18  14   Temp: 97.6 F (36.4 C) 99.7 F (37.6 C)  99.1 F (37.3 C)  TempSrc: Oral Oral  Oral  SpO2: 100% 98%  99%  Weight:   73.2 kg   Height:        Physical Exam: General Alert and answers questions appropriately, no acute distress  Cardiac Regular rate and rhythm, no murmurs, rubs, or gallops  Abdominal Soft, non-tender, without distention    Assessment/Plan:   Principal Problem:   Dysphagia Active Problems:   Secondary rhabdomyolysis   Adult type dermatomyositis (HCC)   Transaminitis   Elevated liver enzymes   Thrombocytopenia (HCC)   Pressure injury of skin  Patient is a 68 year old female who presented with worsening of dysphagia toward solids and liquids for 4 days prior to admission.  # Dermatomyositis: Dysphasia has had marked improvement and patient is able to tolerate fluids and solids.  CIR evaluated patient today and is admitting for inpatient rehab.  We will continue patient on oral equivalent of Solu-Medrol dose, 100 mg prednisone daily.  Patient will need outpatient follow-up with rheumatology.  Plan is for a total 6-week course of high-dose steroids, with tapering to begin on approximately September 21.  Dispo: Admission to CIR today.  Jeanmarie Hubert, MD 09/22/2018, 11:55 AM Pager: [phone]

## 2018-09-22 NOTE — PMR Pre-admission (Signed)
PMR Admission Coordinator Pre-Admission Assessment  Patient: Lisa Mcdowell is an 68 y.o., female MRN: [id number] DOB: Nov 21, 1950 Height: 5' 5.98" (167.6 cm) Weight: 73.2 kg  Insurance Information  PRIMARY: uninsured       Undocumented. Here for 13 years. Toniann Ket, financial counsleor to look into [facility] care and Merceded to be the contact  Medicaid Application Date:       Case Manager:  Disability Application Date:       Case Worker:   The "Data Collection Information Summary" for patients in Inpatient Rehabilitation Facilities with attached "Privacy Act [place] Records" was provided and verbally reviewed with: N/A  Emergency Contact Information Contact Information    Name Relation Home Work Mobile   Hernandez,[name] Daughter [phone]  [phone]   Burnett Kanaris [phone]        Current Medical History  Patient Admitting Diagnosis: Dermatomyositis  History of Present Illness: 68 year old right-handed female with history of GERD and recently diagnosed dermatomyositis was discharged in the hospital 08/18/2018 maintained on prednisone after case has been discussed with Dr.Herrion rheumatology at [facility].  Presented 08/25/2018 with dysphagia with limited oral intake for 1-2 days.  COVID negative, CK 8335.  Chest x-ray negative.  Ultrasound the abdomen showed hepatic steatosis.  Portal vein was patent.  Elevated CK discussed with rheumatology services felt this would remain elevated for some time even after treatment.  Gastroenterology services consulted.  Feeding tube was placed for nutritional support.  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  EGD completed no endoscopic abnormality evident in the esophagus.  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  Placed on intravenous Protonix.  Diet has been advanced to dysphagia #1 thin liquid diet.  Case was discussed with rheumatology service  who recommended continuing steroids and the patient was placed on IVIG transition to p.o. Solu-Medrol.  CK trending down to 872.  Subcutaneous Lovenox for DVT prophylaxis.    Patient's medical record from [facility] has been reviewed by the rehabilitation admission coordinator and physician.  Past Medical History  Past Medical History:  Diagnosis Date  . Cholelithiasis   . Dermatomycosis   . Gallstones 08/08/2010  . GERD (gastroesophageal reflux disease)    pt denies having GERD on day of surgery  . Hyperlipidemia   . Nausea & vomiting     Family History   family history is not on file.  Prior Rehab/Hospitalizations Has the patient had prior rehab or hospitalizations prior to admission? Yes  Has the patient had major surgery during 100 days prior to admission? No   Current Medications  Current Facility-Administered Medications:  .  acetaminophen (TYLENOL) solution 500 mg, 500 mg, Oral, Q6H PRN, Jose Persia, MD, 500 mg at 09/17/18 1841 .  [DISCONTINUED] acetaminophen (TYLENOL) tablet 650 mg, 650 mg, Oral, Q6H PRN **OR** acetaminophen (TYLENOL) suppository 650 mg, 650 mg, Rectal, Q6H PRN, Cornett, Thomas, MD .  enoxaparin (LOVENOX) injection 40 mg, 40 mg, Subcutaneous, Q24H, Jose Persia, MD, 40 mg at 09/22/18 1018 .  feeding supplement (ENSURE ENLIVE) (ENSURE ENLIVE) liquid 237 mL, 237 mL, Oral, BID BM, Axel Filler, MD, 237 mL at 09/22/18 1018 .  feeding supplement (PRO-STAT SUGAR FREE 64) liquid 30 mL, 30 mL, Oral, Daily, Axel Filler, MD, 30 mL at 09/22/18 1018 .  hydrocortisone cream 1 %, , Topical, PRN, Jose Persia, MD .  hydrOXYzine (ATARAX/VISTARIL) tablet 25 mg, 25 mg, Oral, QHS PRN, Marianna Payment, MD, 25 mg at 09/19/18 2137 .  insulin aspart (novoLOG) injection 0-5 Units, 0-5 Units, Subcutaneous, QHS, Seawell, Jaimie A, DO, 2 Units at 09/11/18 0053 .  insulin aspart (novoLOG) injection 0-9 Units, 0-9 Units, Subcutaneous, TID WC,  Seawell, Jaimie A, DO, 5 Units at 09/22/18 1137 .  methylPREDNISolone sodium succinate (SOLU-MEDROL) 125 mg/2 mL injection 80 mg, 80 mg, Intravenous, Q24H, Jose Persia, MD, 80 mg at 09/22/18 0833 .  ondansetron (ZOFRAN) tablet 4 mg, 4 mg, Oral, Q6H PRN **OR** ondansetron (ZOFRAN) injection 4 mg, 4 mg, Intravenous, Q6H PRN, Cornett, Thomas, MD .  pantoprazole sodium (PROTONIX) 40 mg/20 mL oral suspension 40 mg, 40 mg, Oral, Q12H, Marianna Payment, MD, 40 mg at 09/22/18 1018 .  phenol (CHLORASEPTIC) mouth spray 1 spray, 1 spray, Mouth/Throat, PRN, Hoffman, Erik C, DO .  polyethylene glycol (MIRALAX / GLYCOLAX) packet 17 g, 17 g, Oral, Daily PRN, Jeanmarie Hubert, MD, 17 g at 09/21/18 1019 .  [employer], , Oral, PRN, Axel Filler, MD .  sodium chloride flush (NS) 0.9 % injection 10-40 mL, 10-40 mL, Intracatheter, Q12H, Cornett, Thomas, MD, 10 mL at 09/22/18 1014 .  sodium chloride flush (NS) 0.9 % injection 10-40 mL, 10-40 mL, Intracatheter, PRN, Cornett, Thomas, MD  Patients Current Diet:  Diet Order            Diet - low sodium heart healthy        DIET - DYS 1 Room service appropriate? Yes with Assist; Fluid consistency: Thin  Diet effective now              Precautions / Restrictions Precautions Precautions: Fall Precaution Comments: cortrack tube Restrictions Weight Bearing Restrictions: No   Has the patient had 2 or more falls or a fall with injury in the past year? No  Prior Activity Level Limited Community (1-2x/wk): never driven in [place]; stays home with spouse  Prior Functional Level Self Care: Did the patient need help bathing, dressing, using the toilet or eating? Independent  Indoor Mobility: Did the patient need assistance with walking from room to room (with or without device)? Independent  Stairs: Did the patient need assistance with internal or external stairs (with or without device)? Independent  Functional Cognition: Did the patient need  help planning regular tasks such as shopping or remembering to take medications? Independent  Home Assistive Devices / Equipment Home Assistive Devices/Equipment: None Home Equipment: Walker - 2 wheels, Cane - single point  Prior Device Use: Indicate devices/aids used by the patient prior to current illness, exacerbation or injury? None of the above  Current Functional Level Cognition  Overall Cognitive Status: Within Functional Limits for tasks assessed Difficult to assess due to: Non-English speaking Orientation Level: Oriented to person, Oriented to place, Oriented to situation, Oriented to time General Comments: son present and translated    Extremity Assessment (includes Sensation/Coordination)  Upper Extremity Assessment: Generalized weakness RUE Deficits / Details: 3+/5 MM grade and 4/5 grip strength LUE Deficits / Details: 3+/5 MM grade and 3+/5 grip strength  Lower Extremity Assessment: Generalized weakness    ADLs  Overall ADL's : Needs assistance/impaired Eating/Feeding: Minimal assistance, Sitting Grooming: Wash/dry hands, Wash/dry face, Oral care, Brushing hair, Minimal assistance, Standing Grooming Details (indicate cue type and reason): tremors in hands- pt required minA to apply toothpaste to brush Upper Body Bathing: Minimal assistance, Sitting Lower Body Bathing: Sit to/from stand, Maximal assistance Upper Body Dressing : Minimal assistance, Sitting Lower Body Dressing: Sit to/from stand, Total assistance Lower Body Dressing Details (indicate cue type and  reason): total A to don B shoes Toilet Transfer: Min guard, RW, Ambulation Functional mobility during ADLs: Min guard, Minimal assistance, Rolling walker, Cueing for safety, Cueing for sequencing General ADL Comments: Pt minA to minguardA with RW as pt unsteady and keeping RW too close. Pt requires assist for power up. Pt education on new exercises to performs when seated in recliner    Mobility  Overal bed  mobility: Needs Assistance Bed Mobility: Supine to Sit Supine to sit: Min assist General bed mobility comments: pt OOB in chair upon arrival    Transfers  Overall transfer level: Needs assistance Equipment used: Rolling walker (2 wheeled) Transfers: Sit to/from Stand Sit to Stand: Mod assist, Max assist, +2 physical assistance General transfer comment: cues for safe hand placement and technique; assistance required to power up into standing     Ambulation / Gait / Stairs / Wheelchair Mobility  Ambulation/Gait Ambulation/Gait assistance: +2 safety/equipment, Min assist(chair follow) Gait Distance (Feet): 80 Feet Assistive device: Rolling walker (2 wheeled) Gait Pattern/deviations: Step-through pattern, Trunk flexed, Decreased stride length General Gait Details: heavy reliance on RW; assist to steady and chair follow for safety; HR up to 129 bpm Gait velocity: decreased    Posture / Balance Balance Overall balance assessment: Needs assistance Sitting-balance support: No upper extremity supported Sitting balance-Leahy Scale: Fair Standing balance support: Bilateral upper extremity supported, During functional activity Standing balance-Leahy Scale: Poor Standing balance comment: Reliant on BUE support     Special needs/care consideration BiPAP/CPAP n/a CPM n/a Continuous Drip IV  N/a Dialysis n/a Life Vest  N/a Oxygen  N/a Special Bed  N/a Trach Size  N/a Wound Vac n/a Skin  Dry skin; ecchymosis to back, right arm and legs; petechiaia mid face; rash to face and abdomen; 9/2 buttocks stage II 0.5 cm x 0.5 cm x 0 cm Bowel mgmt: continent LBM 9/6 Bladder mgmt: external catheter Diabetic mgmt: n/a Behavioral consideration  N.a Chemo/radiation n/a Visitor designee is daughter, Delight Stare interpreter to be arranged for therapy sessions   Previous Home Environment  Living Arrangements: (lives with spouse adn son MIguel; daughter Merceds lives sep)  Lives With: Spouse,  Son Available Help at Discharge: Family, Available 24 hours/day(daughter and son to provide 24/7 assist at d/c) Type of Home: House Home Layout: One level Home Access: Stairs to enter Entrance Stairs-Rails: None Technical brewer of Steps: 1 Bathroom Shower/Tub: Gaffer, Architectural technologist: Programmer, systems: Yes Home Care Services: No Additional Comments: son works 5 am until 4 pm; spouse unemployed due to undocumented. Daughter is CNA daughter lives seperately  Discharge Living Setting Plans for Discharge Living Setting: Patient's home, Lives with (comment)(spouse and son, [name]) Type of Home at Discharge: House Discharge Home Layout: One level Discharge Home Access: Stairs to enter Entrance Stairs-Rails: None Entrance Stairs-Number of Steps: 1 Discharge Bathroom Shower/Tub: Walk-in shower, Curtain Discharge Bathroom Toilet: Standard Discharge Bathroom Accessibility: Yes How Accessible: Accessible via walker Does the patient have any problems obtaining your medications?: Yes (Describe)(uninsured)  Social/Family/Support Systems Patient Roles: Spouse, Parent Contact Information: Orion Crook , daughter main contact/caregiver Anticipated Caregiver: [name], daughter Anticipated Caregiver's Contact Information: [phone] Ability/Limitations of Caregiver: [name] works 3 jobs, CNA usually 9 am until 2 pm , but she is off work for 4 weeks and then she and [name] will decide how to change their schedules to provide 24/7 supervision Caregiver Availability: 24/7 Discharge Plan Discussed with Primary Caregiver: Yes Is Caregiver In Agreement with Plan?: Yes Does Caregiver/Family have Issues with  Lodging/Transportation while Pt is in Rehab?: No  Daughter, [name] to provide 24/7 supervision  Goals/Additional Needs Patient/Family Goal for Rehab: supervision with PT, OT, and SLP Expected length of stay: ELOS 7 days Cultural Considerations:  [place] undocumented for  13 years Special Service Needs: spanish interpreter Pt/Family Agrees to Admission and willing to participate: Yes Program Orientation Provided & Reviewed with Pt/Caregiver Including Roles  & Responsibilities: Yes  Decrease burden of Care through IP rehab admission: n/a  Possible need for SNF placement upon discharge:  Not anticiapted  Patient Condition: I have reviewed medical records from [facility] , spoken with CM, and patient and daughter. I met with patient at the bedside for inpatient rehabilitation assessment.  Patient will benefit from ongoing PT, OT and SLP, can actively participate in 3 hours of therapy a day 5 days of the week, and can make measurable gains during the admission.  Patient will also benefit from the coordinated team approach during an Inpatient Acute Rehabilitation admission.  The patient will receive intensive therapy as well as Rehabilitation physician, nursing, social worker, and care management interventions.  Due to bladder management, bowel management, safety, skin/wound care, disease management, medication administration, pain management and patient education the patient requires 24 hour a day rehabilitation nursing.  The patient is currently min ot mod asisst with mobility and basic ADLs.  Discharge setting and therapy post discharge at home with home health is anticipated.  Patient has agreed to participate in the Acute Inpatient Rehabilitation Program and will admit today.  Preadmission Screen Completed By:  Cleatrice Burke, 09/22/2018 11:44 AM ______________________________________________________________________   Discussed status with Dr. Posey Pronto on  09/22/2018 at  1145 and received approval for admission today.  Admission Coordinator:  Cleatrice Burke, RN MSN, time  1145 Date  09/22/2018   Assessment/Plan: Diagnosis: Dermatomyositis  1. Does the need for close, 24 hr/day Medical supervision in  concert with the patient's rehab needs make it unreasonable for this patient to be served in a less intensive setting? Yes  2. Co-Morbidities requiring supervision/potential complications: GERD, Dysphagia 3. Due to safety, disease management, medication administration and patient education, does the patient require 24 hr/day rehab nursing? Yes 4. Does the patient require coordinated care of a physician, rehab nurse, PT (1-2 hrs/day, 5 days/week), OT (1-2 hrs/day, 5 days/week) and SLP (1-2 hrs/day, 5 days/week) to address physical and functional deficits in the context of the above medical diagnosis(es)? Yes Addressing deficits in the following areas: balance, endurance, locomotion, strength, transferring, bathing, dressing, toileting, cognition, language, swallowing and psychosocial support 5. Can the patient actively participate in an intensive therapy program of at least 3 hrs of therapy 5 days a week? Yes 6. The potential for patient to make measurable gains while on inpatient rehab is excellent 7. Anticipated functional outcomes upon discharge from inpatients are: supervision and min assist PT, supervision and min assist OT, supervision SLP 8. Estimated rehab length of stay to reach the above functional goals is: 9-13 days. 9. Anticipated D/C setting: Home 10. Anticipated post D/C treatments: HH therapy and Home excercise program 11. Overall Rehab/Functional Prognosis: excellent and good  MD Signature: Delice Lesch, MD, ABPMR

## 2018-09-22 NOTE — Progress Notes (Signed)
Jamse Arn, MD  Physician  Physical Medicine and Rehabilitation  PMR Pre-admission  Signed  Date of Service:  09/22/2018 11:22 AM      Related encounter: ED to Hosp-Admission (Discharged) from 08/25/2018 in [facility]      Signed         Show:Clear all [x] Manual[x] Template[x] Copied  Added by: [x] Cristina Gong, RN[x] Jamse Arn, MD  [] Hover for details PMR Admission Coordinator Pre-Admission Assessment  Patient: Lisa Mcdowell is an 68 y.o., female MRN: [id number] DOB: 1950/04/07 Height: 5' 5.98" (167.6 cm) Weight: 73.2 kg  Insurance Information  PRIMARY: uninsured       Undocumented. Here for 13 years. Toniann Ket, financial counsleor to look into [facility] care and Merceded to be the contact  Medicaid Application Date:       Case Manager:  Disability Application Date:       Case Worker:   The "Data Collection Information Summary" for patients in Inpatient Rehabilitation Facilities with attached "Privacy Act [place] Records" was provided and verbally reviewed with: N/A  Emergency Contact Information         Contact Information    Name Relation Home Work Mobile   Hernandez,[name] Daughter [phone]  [phone]   Burnett Kanaris [phone]        Current Medical History  Patient Admitting Diagnosis: Dermatomyositis  History of Present Illness:68 year old right-handed female with history of GERD and recently diagnosed dermatomyositis was discharged in the hospital 08/18/2018 maintained on prednisone after case has been discussed with Dr.Herrionrheumatology at [place] 8/10/2020with dysphagia with limited oral intake for 1-2 days.COVID negative, CK 8335. Chest x-ray negative. Ultrasound the abdomen showed hepatic steatosis. Portal vein was patent. Elevated CK discussed with rheumatology services felt this would remain elevated for some time even after treatment.  Gastroenterology services consulted. Feeding tube was placed for nutritional support. 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 Diet has been advanced to dysphagia #1 thin liquid diet. Case was discussed with rheumatology service who recommended continuing steroids and the patient was placed on IVIG transition to p.o. Solu-Medrol. CK trending down to 872. Subcutaneous Lovenox for DVT prophylaxis.    Patient's medical record from [facility] has been reviewed by the rehabilitation admission coordinator and physician.  Past Medical History      Past Medical History:  Diagnosis Date  . Cholelithiasis   . Dermatomycosis   . Gallstones 08/08/2010  . GERD (gastroesophageal reflux disease)    pt denies having GERD on day of surgery  . Hyperlipidemia   . Nausea & vomiting     Family History   family history is not on file.  Prior Rehab/Hospitalizations Has the patient had prior rehab or hospitalizations prior to admission? Yes  Has the patient had major surgery during 100 days prior to admission? No              Current Medications  Current Facility-Administered Medications:  .  acetaminophen (TYLENOL) solution 500 mg, 500 mg, Oral, Q6H PRN, Jose Persia, MD, 500 mg at 09/17/18 1841 .  [DISCONTINUED] acetaminophen (TYLENOL) tablet 650 mg, 650 mg, Oral, Q6H PRN **OR** acetaminophen (TYLENOL) suppository 650 mg, 650 mg, Rectal, Q6H PRN, Cornett, Thomas, MD .  enoxaparin (LOVENOX) injection 40 mg, 40 mg, Subcutaneous, Q24H, Jose Persia, MD, 40 mg at 09/22/18 1018 .  feeding supplement (ENSURE ENLIVE) (ENSURE ENLIVE)  liquid 237 mL, 237 mL, Oral, BID BM, Axel Filler, MD, 237 mL at 09/22/18 1018 .  feeding supplement (PRO-STAT SUGAR FREE 64) liquid 30 mL, 30  mL, Oral, Daily, Axel Filler, MD, 30 mL at 09/22/18 1018 .  hydrocortisone cream 1 %, , Topical, PRN, Jose Persia, MD .  hydrOXYzine (ATARAX/VISTARIL) tablet 25 mg, 25 mg, Oral, QHS PRN, Marianna Payment, MD, 25 mg at 09/19/18 2137 .  insulin aspart (novoLOG) injection 0-5 Units, 0-5 Units, Subcutaneous, QHS, Seawell, Jaimie A, DO, 2 Units at 09/11/18 0053 .  insulin aspart (novoLOG) injection 0-9 Units, 0-9 Units, Subcutaneous, TID WC, Seawell, Jaimie A, DO, 5 Units at 09/22/18 1137 .  methylPREDNISolone sodium succinate (SOLU-MEDROL) 125 mg/2 mL injection 80 mg, 80 mg, Intravenous, Q24H, Jose Persia, MD, 80 mg at 09/22/18 0833 .  ondansetron (ZOFRAN) tablet 4 mg, 4 mg, Oral, Q6H PRN **OR** ondansetron (ZOFRAN) injection 4 mg, 4 mg, Intravenous, Q6H PRN, Cornett, Thomas, MD .  pantoprazole sodium (PROTONIX) 40 mg/20 mL oral suspension 40 mg, 40 mg, Oral, Q12H, Marianna Payment, MD, 40 mg at 09/22/18 1018 .  phenol (CHLORASEPTIC) mouth spray 1 spray, 1 spray, Mouth/Throat, PRN, Hoffman, Erik C, DO .  polyethylene glycol (MIRALAX / GLYCOLAX) packet 17 g, 17 g, Oral, Daily PRN, Jeanmarie Hubert, MD, 17 g at 09/21/18 1019 .  Resource [employer], , Oral, PRN, Axel Filler, MD .  sodium chloride flush (NS) 0.9 % injection 10-40 mL, 10-40 mL, Intracatheter, Q12H, Cornett, Thomas, MD, 10 mL at 09/22/18 1014 .  sodium chloride flush (NS) 0.9 % injection 10-40 mL, 10-40 mL, Intracatheter, PRN, Cornett, Thomas, MD  Patients Current Diet:     Diet Order                  Diet - low sodium heart healthy         DIET - DYS 1 Room service appropriate? Yes with Assist; Fluid consistency: Thin  Diet effective now               Precautions / Restrictions Precautions Precautions: Fall Precaution Comments: cortrack tube Restrictions Weight Bearing Restrictions: No   Has the patient had 2 or more falls or a fall with injury in the past year? No  Prior Activity  Level Limited Community (1-2x/wk): never driven in [place]; stays home with spouse  Prior Functional Level Self Care: Did the patient need help bathing, dressing, using the toilet or eating? Independent  Indoor Mobility: Did the patient need assistance with walking from room to room (with or without device)? Independent  Stairs: Did the patient need assistance with internal or external stairs (with or without device)? Independent  Functional Cognition: Did the patient need help planning regular tasks such as shopping or remembering to take medications? Independent  Home Assistive Devices / Equipment Home Assistive Devices/Equipment: None Home Equipment: Walker - 2 wheels, Cane - single point  Prior Device Use: Indicate devices/aids used by the patient prior to current illness, exacerbation or injury? None of the above  Current Functional Level Cognition  Overall Cognitive Status: Within Functional Limits for tasks assessed Difficult to assess due to: Non-English speaking Orientation Level: Oriented to person, Oriented to place, Oriented to situation, Oriented to time General Comments: son present and translated    Extremity Assessment (includes Sensation/Coordination)  Upper Extremity Assessment: Generalized weakness RUE Deficits / Details: 3+/5 MM grade and 4/5 grip strength LUE Deficits / Details: 3+/5 MM grade and 3+/5 grip strength  Lower Extremity Assessment: Generalized weakness    ADLs  Overall ADL's : Needs assistance/impaired Eating/Feeding: Minimal assistance, Sitting Grooming: Wash/dry hands, Wash/dry face, Oral care, Brushing hair, Minimal assistance, Standing Grooming Details (indicate cue type and reason): tremors in hands- pt required minA to apply toothpaste to brush Upper Body Bathing: Minimal assistance, Sitting Lower Body Bathing: Sit to/from stand, Maximal assistance Upper Body Dressing : Minimal assistance, Sitting Lower Body Dressing: Sit to/from  stand, Total assistance Lower Body Dressing Details (indicate cue type and reason): total A to don B shoes Toilet Transfer: Min guard, RW, Ambulation Functional mobility during ADLs: Min guard, Minimal assistance, Rolling walker, Cueing for safety, Cueing for sequencing General ADL Comments: Pt minA to minguardA with RW as pt unsteady and keeping RW too close. Pt requires assist for power up. Pt education on new exercises to performs when seated in recliner    Mobility  Overal bed mobility: Needs Assistance Bed Mobility: Supine to Sit Supine to sit: Min assist General bed mobility comments: pt OOB in chair upon arrival    Transfers  Overall transfer level: Needs assistance Equipment used: Rolling walker (2 wheeled) Transfers: Sit to/from Stand Sit to Stand: Mod assist, Max assist, +2 physical assistance General transfer comment: cues for safe hand placement and technique; assistance required to power up into standing     Ambulation / Gait / Stairs / Wheelchair Mobility  Ambulation/Gait Ambulation/Gait assistance: +2 safety/equipment, Min assist(chair follow) Gait Distance (Feet): 80 Feet Assistive device: Rolling walker (2 wheeled) Gait Pattern/deviations: Step-through pattern, Trunk flexed, Decreased stride length General Gait Details: heavy reliance on RW; assist to steady and chair follow for safety; HR up to 129 bpm Gait velocity: decreased    Posture / Balance Balance Overall balance assessment: Needs assistance Sitting-balance support: No upper extremity supported Sitting balance-Leahy Scale: Fair Standing balance support: Bilateral upper extremity supported, During functional activity Standing balance-Leahy Scale: Poor Standing balance comment: Reliant on BUE support     Special needs/care consideration BiPAP/CPAP n/a CPM n/a Continuous Drip IV  N/a Dialysis n/a Life Vest  N/a Oxygen  N/a Special Bed  N/a Trach Size  N/a Wound Vac n/a Skin  Dry skin;  ecchymosis to back, right arm and legs; petechiaia mid face; rash to face and abdomen; 9/2 buttocks stage II 0.5 cm x 0.5 cm x 0 cm Bowel mgmt: continent LBM 9/6 Bladder mgmt: external catheter Diabetic mgmt: n/a Behavioral consideration  N.a Chemo/radiation n/a Visitor designee is daughter, Delight Stare interpreter to be arranged for therapy sessions   Previous Home Environment  Living Arrangements: (lives with spouse adn son MIguel; daughter Merceds lives sep)  Lives With: Spouse, Son Available Help at Discharge: Family, Available 24 hours/day(daughter and son to provide 24/7 assist at d/c) Type of Home: House Home Layout: One level Home Access: Stairs to enter Entrance Stairs-Rails: None Technical brewer of Steps: 1 Bathroom Shower/Tub: Gaffer, Architectural technologist: Programmer, systems: Yes Home Care Services: No Additional Comments: son works 5 am until 4 pm; spouse unemployed due to undocumented. Daughter is CNA daughter lives seperately  Discharge Living Setting Plans for Discharge Living Setting: Patient's home, Lives with (comment)(spouse and son, [name]) Type of Home at Discharge: House Discharge Home Layout: One level Discharge Home Access: Stairs to enter Entrance Stairs-Rails: None Entrance Stairs-Number of Steps: 1 Discharge Bathroom Shower/Tub: Walk-in shower, Curtain Discharge Bathroom Toilet: Standard Discharge Bathroom Accessibility: Yes How Accessible: Accessible via walker Does the patient have any problems obtaining your medications?: Yes (Describe)(uninsured)  Social/Family/Support Systems Patient Roles: Spouse, Parent Contact Information: Orion Crook , daughter main contact/caregiver Anticipated Caregiver: [name], daughter Anticipated Caregiver's Contact Information: [phone] Ability/Limitations of Caregiver: [name] works 3 jobs, CNA usually 9 am until 2 pm , but she is off work for 4 weeks and then she  and [name] will decide how to change their schedules to provide 24/7 supervision Caregiver Availability: 24/7 Discharge Plan Discussed with Primary Caregiver: Yes Is Caregiver In Agreement with Plan?: Yes Does Caregiver/Family have Issues with Lodging/Transportation while Pt is in Rehab?: No  Daughter, [name] to provide 24/7 supervision  Goals/Additional Needs Patient/Family Goal for Rehab: supervision with PT, OT, and SLP Expected length of stay: ELOS 7 days Cultural Considerations: [place]; [place] undocumented for  13 years Special Service Needs: spanish interpreter Pt/Family Agrees to Admission and willing to participate: Yes Program Orientation Provided & Reviewed with Pt/Caregiver Including Roles  & Responsibilities: Yes  Decrease burden of Care through IP rehab admission: n/a  Possible need for SNF placement upon discharge:  Not anticiapted  Patient Condition: I have reviewed medical records from [facility] , spoken with CM, and patient and daughter. I met with patient at the bedside for inpatient rehabilitation assessment.  Patient will benefit from ongoing PT, OT and SLP, can actively participate in 3 hours of therapy a day 5 days of the week, and can make measurable gains during the admission.  Patient will also benefit from the coordinated team approach during an Inpatient Acute Rehabilitation admission.  The patient will receive intensive therapy as well as Rehabilitation physician, nursing, social worker, and care management interventions.  Due to bladder management, bowel management, safety, skin/wound care, disease management, medication administration, pain management and patient education the patient requires 24 hour a day rehabilitation nursing.  The patient is currently min ot mod asisst with mobility and basic ADLs.  Discharge setting and therapy post discharge at home with home health is anticipated.  Patient has agreed to participate in the Acute  Inpatient Rehabilitation Program and will admit today.  Preadmission Screen Completed By:  Cleatrice Burke, 09/22/2018 11:44 AM ______________________________________________________________________   Discussed status with Dr. Posey Pronto on  09/22/2018 at  1145 and received approval for admission today.  Admission Coordinator:  Cleatrice Burke, RN MSN, time  1145 Date  09/22/2018   Assessment/Plan: Diagnosis: Dermatomyositis  1. Does the need for close, 24 hr/day Medical supervision in concert with the patient's rehab needs make it unreasonable for this patient to be served in a less intensive setting? Yes  2. Co-Morbidities requiring supervision/potential complications: GERD, Dysphagia 3. Due to safety, disease management, medication administration and patient education, does the patient require 24 hr/day rehab nursing? Yes 4. Does the patient require coordinated care of a physician, rehab nurse, PT (1-2 hrs/day, 5 days/week), OT (1-2 hrs/day, 5 days/week) and SLP (1-2 hrs/day, 5 days/week) to address physical and functional deficits in the context of the above medical diagnosis(es)? Yes Addressing deficits in the following areas: balance, endurance, locomotion, strength, transferring, bathing, dressing, toileting, cognition, language, swallowing and psychosocial support 5. Can the patient actively participate in an intensive therapy program of at least 3 hrs of therapy 5 days a week? Yes 6. The potential for patient to make measurable gains while on inpatient rehab is excellent 7. Anticipated functional outcomes upon discharge from inpatients are: supervision and min assist PT, supervision and min assist OT, supervision SLP 8. Estimated rehab length of stay to reach the above functional goals is:  9-13 days. 9. Anticipated D/C setting: Home 10. Anticipated post D/C treatments: HH therapy and Home excercise program 11. Overall Rehab/Functional Prognosis: excellent and good  MD  Signature: Delice Lesch, MD, ABPMR        Revision History

## 2018-09-22 NOTE — Discharge Summary (Signed)
Name: Lisa Mcdowell MRN: [id number] DOB: January 17, 1950 68 y.o. PCP: Department, [facility]  Date of Admission: 08/25/2018 12:14 PM Date of Discharge:  Attending Physician: Lisa Mcdowell, *  Discharge Diagnosis: 1. Dermatomyositis 2. Anemia  Discharge Medications: Allergies as of 09/22/2018   No Active Allergies     Medication List    STOP taking these medications   amitriptyline 25 MG tablet Commonly known as: ELAVIL   clobetasol ointment 0.05 % Commonly known as: TEMOVATE   doxepin 25 MG capsule Commonly known as: SINEQUAN   NAC PO   pantoprazole 40 MG tablet Commonly known as: PROTONIX Replaced by: pantoprazole sodium 40 mg/20 mL Pack   senna-docusate 8.6-50 MG tablet Commonly known as: Senokot-S     TAKE these medications   acetaminophen 160 MG/5ML solution Commonly known as: TYLENOL Take 15.6 mLs (500 mg total) by mouth every 6 (six) hours as needed for headache.   enoxaparin 40 MG/0.4ML injection Commonly known as: LOVENOX Inject 0.4 mLs (40 mg total) into the skin daily. Start taking on: September 23, 2018   feeding supplement (ENSURE ENLIVE) Liqd Take 237 mLs by mouth 2 (two) times daily between meals.   feeding supplement (PRO-STAT SUGAR FREE 64) Liqd Take 30 mLs by mouth daily. Start taking on: September 23, 2018   hydrocortisone cream 1 % Apply topically as needed for itching (rash). What changed:   when to take this  reasons to take this   hydrOXYzine 25 MG tablet Commonly known as: ATARAX/VISTARIL Take 1 tablet (25 mg total) by mouth at bedtime as needed (insomnia). What changed:   when to take this  reasons to take this   insulin aspart 100 UNIT/ML injection Commonly known as: novoLOG Inject 0-9 Units into the skin 3 (three) times daily with meals.   insulin aspart 100 UNIT/ML injection Commonly known as: novoLOG Inject 0-5 Units into the skin at bedtime.   ondansetron 4 MG tablet Commonly known as:  ZOFRAN Take 1 tablet (4 mg total) by mouth every 6 (six) hours as needed for nausea.   pantoprazole sodium 40 mg/20 mL Pack Commonly known as: PROTONIX Take 20 mLs (40 mg total) by mouth every 12 (twelve) hours. Replaces: pantoprazole 40 MG tablet   phenol 1.4 % Liqd Commonly known as: CHLORASEPTIC Use as directed 1 spray in the mouth or throat as needed for throat irritation / pain.   polyethylene glycol 17 g packet Commonly known as: MIRALAX / GLYCOLAX Take 17 g by mouth daily as needed for mild constipation, moderate constipation or severe constipation.   predniSONE 50 MG tablet Commonly known as: DELTASONE Take 2 tablets (100 mg total) by mouth daily. What changed:   medication strength  how much to take  when to take this   Resource ThickenUp Clear Powd Take 120 g by mouth as needed.   Vitamin D (Ergocalciferol) 1.25 MG (50000 UT) Caps capsule Commonly known as: DRISDOL Take 50,000 Units by mouth every Wednesday.       Disposition and follow-up:   Ms.Lisa Mcdowell was [facility] from [facility] in Stable condition.  At the hospital follow up visit please address:  1.  Ensure patient is continuing to have improvement in her dysphagia. Ensure patient is continuing on oral prednisone therapy and is following with rheumatology to manage prednisone taper (see below). Will need to start steroid taper on approximately September 21st.  2.  Labs / imaging needed at time of follow-up: CBC, CMP, CK  3.  Pending labs/ test needing follow-up: None  Follow-up Appointments: Follow-up [facility], [name]. Call.   Specialty: General Surgery Why: as needed with questions or concerns regarding right thigh incision Contact information: Lisa Mcdowell by problem list: 1. 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  On admission patient was unable to tolerate any p.o. intake.  Patient had elevated CK to 8335 on presentation.  Patient was started on methylprednisolone 60 mg on 8/10 for dermatomyositis flare which she received for 4 days, and dosage was then increased to 80 mg daily.  Barium swallow study showed oropharyngeal dysfunction without evident esophageal abnormality.  EGD was without evidence of obstruction.  Muscle biopsy of right upper thigh performed on 8/20 and results were consistent with inflammatory myopathy.  CK down trended during hospitalization on steroid therapy.  Patient was discharged on oral equivalent of methylprednisolone dose - 100 mg oral prednisone. Plan is for patient to continue this high-dose steroid therapy for 6 weeks (from 8/10) followed by tapering. Patient will need to followup with rheumatology to manage this taper. Patient has established care with [name] rheumatology as an outpatient.   2. Anemia: Hemoglobin of 9.1 at time of discharge. Thought to be secondary to persistent inflammatory response over month prior to admission.  MCV at upper limit of normal, B12 within normal limits, no iron deficiency, no acute thrombolysis, and reticulocyte index indicates hyperproliferation.   Discharge Vitals:   BP 129/61 (BP Location: Left Arm)   Pulse (!) 105   Temp 99.1 F (37.3 C) (Oral)   Resp 14   Ht 5' 5.98" (1.676 m)   Wt 73.2 kg   SpO2 99%   BMI 26.06 kg/m   Pertinent Labs, Studies, and Procedures:   CBC Latest Ref Rng & Units 09/18/2018 09/17/2018 09/16/2018  WBC 4.0 - 10.5 K/uL 5.6 6.0 5.8  Hemoglobin 12.0 - 15.0 g/dL 9.1(L) 8.9(L) 9.2(L)  Hematocrit 36.0 - 46.0 % 28.6(L) 27.5(L) 28.9(L)  Platelets 150 - 400 K/uL 167 152 149(L)   BMP Latest Ref Rng & Units 09/16/2018 09/15/2018 09/13/2018  Glucose 70 - 99 mg/dL 108(H) 97 135(H)  BUN 8 - 23 mg/dL 19 18 17   Creatinine 0.44 - 1.00 mg/dL 0.45 0.31(L)  0.31(L)  Sodium 135 - 145 mmol/L 137 135 135  Potassium 3.5 - 5.1 mmol/L 3.8 3.6 3.8  Chloride 98 - 111 mmol/L 103 102 101  CO2 22 - 32 mmol/L 27 26 27   Calcium 8.9 - 10.3 mg/dL 7.9(L) 7.9(L) 7.9(L)   Lab Results  Component Value Date   ALT 103 (H) 09/13/2018   AST 172 (H) 09/13/2018   ALKPHOS 49 09/13/2018   BILITOT 0.6 09/13/2018     CK: 8,335 (08/25/2018) -> 5,144 (08/29/2018) -> 3,507 (09/09/2018) -> 879 (09/18/2018) -> 543 (09/20/2018)  Muscle Biopsy (09/04/2018): This biopsy is not ideal, but shows features suggestive of an inflammatory myopathy, and clinical correlation is suggested.  EGD Gastric Biopsies (09/04/2018): Mild chronic gastritis, negative for Helicobacter pylori.   Discharge Instructions: Discharge Instructions    Diet - low sodium heart healthy   Complete by: As directed    Increase activity slowly   Complete by: As directed       Signed: Jeanmarie Hubert, MD 09/22/2018, 11:55 AM   Pager: [phone]

## 2018-09-22 NOTE — Progress Notes (Signed)
Inpatient Rehabilitation Admissions Coordinator  I met with patient with her daughter, [name] at bedside. Daughter can provide 24/7 supervision at home when her Mom after a CIR admit. They are in agreement to admit to CIR today. I will make the arrangements to admit today.  Danne Baxter, RN, MSN Rehab Admissions Coordinator [phone] 09/22/2018 10:57 AM

## 2018-09-22 NOTE — Progress Notes (Signed)
Admit to unit, oriented to rehab, medications, therapy schedule and plan of care. States an understanding of information reviewed. Margarito Liner

## 2018-09-23 ENCOUNTER — Inpatient Hospital Stay (HOSPITAL_COMMUNITY): Payer: Self-pay | Admitting: Occupational Therapy

## 2018-09-23 ENCOUNTER — Inpatient Hospital Stay (HOSPITAL_COMMUNITY): Payer: Self-pay

## 2018-09-23 ENCOUNTER — Inpatient Hospital Stay (HOSPITAL_COMMUNITY): Payer: Self-pay | Admitting: Speech Pathology

## 2018-09-23 DIAGNOSIS — M7989 Other specified soft tissue disorders: Secondary | ICD-10-CM

## 2018-09-23 DIAGNOSIS — M3390 Dermatopolymyositis, unspecified, organ involvement unspecified: Secondary | ICD-10-CM

## 2018-09-23 LAB — COMPREHENSIVE METABOLIC PANEL
ALT: 77 U/L — ABNORMAL HIGH (ref 0–44)
AST: 81 U/L — ABNORMAL HIGH (ref 15–41)
Albumin: 2.4 g/dL — ABNORMAL LOW (ref 3.5–5.0)
Alkaline Phosphatase: 43 U/L (ref 38–126)
Anion gap: 7 (ref 5–15)
BUN: 20 mg/dL (ref 8–23)
CO2: 27 mmol/L (ref 22–32)
Calcium: 8.1 mg/dL — ABNORMAL LOW (ref 8.9–10.3)
Chloride: 105 mmol/L (ref 98–111)
Creatinine, Ser: 0.45 mg/dL (ref 0.44–1.00)
GFR calc Af Amer: >60 mL/min (ref >60)
GFR calc non Af Amer: >60 mL/min (ref >60)
Glucose, Bld: 83 mg/dL (ref 70–99)
Potassium: 3.8 mmol/L (ref 3.5–5.1)
Sodium: 139 mmol/L (ref 135–145)
Total Bilirubin: 0.8 mg/dL (ref 0.3–1.2)
Total Protein: 5.8 g/dL — ABNORMAL LOW (ref 6.5–8.1)

## 2018-09-23 LAB — CBC WITH DIFFERENTIAL/PLATELET
Abs Immature Granulocytes: 0.04 10*3/uL (ref 0.00–0.07)
Basophils Absolute: 0 10*3/uL (ref 0.0–0.1)
Basophils Relative: 0 %
Eosinophils Absolute: 0.1 10*3/uL (ref 0.0–0.5)
Eosinophils Relative: 1 %
HCT: 30.7 % — ABNORMAL LOW (ref 36.0–46.0)
Hemoglobin: 9.8 g/dL — ABNORMAL LOW (ref 12.0–15.0)
Immature Granulocytes: 1 %
Lymphocytes Relative: 37 %
Lymphs Abs: 1.8 10*3/uL (ref 0.7–4.0)
MCH: 33.1 pg (ref 26.0–34.0)
MCHC: 31.9 g/dL (ref 30.0–36.0)
MCV: 103.7 fL — ABNORMAL HIGH (ref 80.0–100.0)
Monocytes Absolute: 0.3 10*3/uL (ref 0.1–1.0)
Monocytes Relative: 6 %
Neutro Abs: 2.8 10*3/uL (ref 1.7–7.7)
Neutrophils Relative %: 55 %
Platelets: 153 10*3/uL (ref 150–400)
RBC: 2.96 MIL/uL — ABNORMAL LOW (ref 3.87–5.11)
RDW: 16.9 % — ABNORMAL HIGH (ref 11.5–15.5)
WBC: 5 10*3/uL (ref 4.0–10.5)
nRBC: 0.4 % — ABNORMAL HIGH (ref 0.0–0.2)

## 2018-09-23 LAB — GLUCOSE, CAPILLARY
Glucose-Capillary: 136 mg/dL — ABNORMAL HIGH (ref 70–99)
Glucose-Capillary: 68 mg/dL — ABNORMAL LOW (ref 70–99)
Glucose-Capillary: 98 mg/dL (ref 70–99)
Glucose-Capillary: 94 mg/dL (ref 70–99)
Glucose-Capillary: 154 mg/dL — ABNORMAL HIGH (ref 70–99)
Glucose-Capillary: 189 mg/dL — ABNORMAL HIGH (ref 70–99)
Glucose-Capillary: 105 mg/dL — ABNORMAL HIGH (ref 70–99)

## 2018-09-23 MED ORDER — PREDNISONE 20 MG PO TABS
100.0000 mg | ORAL_TABLET | Freq: Every day | ORAL | Status: DC
  Administered 2018-09-23 – 2018-10-06 (×14): 100 mg via ORAL
  Filled 2018-09-23 (×14): qty 5

## 2018-09-23 NOTE — Progress Notes (Signed)
Patient information reviewed and entered into eRehab System by Becky Anzel Kearse, PPS coordinator. Information including medical coding, function ability, and quality indicators will be reviewed and updated through discharge.   

## 2018-09-23 NOTE — Evaluation (Signed)
Occupational Therapy Assessment and Plan  Patient Details  Name: Lisa Mcdowell MRN: [id number] Date of Birth: [birth year]  OT Diagnosis: abnormal posture, muscle weakness (generalized) and coordination disorder Rehab Potential: Rehab Potential (ACUTE ONLY): Good ELOS: 14- 17 days   Today's Date: 09/23/2018 OT Individual Time: 1300-1410 OT Individual Time Calculation (min): 70 min     Problem List:  Patient Active Problem List   Diagnosis Date Noted  . 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 Pressure injury of skin 09/17/2018  . Thrombocytopenia ([place]) 09/06/2018  . Transaminitis 08/26/2018  . Elevated liver enzymes   . Dysphagia 08/25/2018  . 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 Encounter for screening colonoscopy 03/24/2015  . Essential tremor 02/06/2015  . Hyperlipidemia 10/28/2014  . GERD (gastroesophageal reflux disease) 10/28/2014  . Hemorrhoids 10/28/2014  . Gallstones 08/08/2010    Past Medical History:  Past Medical History:  Diagnosis Date  . Cholelithiasis   . Dermatomycosis   . Gallstones 08/08/2010  . GERD (gastroesophageal reflux disease)    pt denies having GERD on day of surgery  . Hyperlipidemia   . Nausea & vomiting    Past Surgical History:  Past Surgical History:  Procedure Laterality Date  . BIOPSY  08/30/2018   Procedure: BIOPSY;  Surgeon: Mauri Pole, MD;  Location: [facility] ENDOSCOPY;  Service: Endoscopy;;  . CHOLECYSTECTOMY  08/08/2010   Procedure: LAPAROSCOPIC CHOLECYSTECTOMY;  Surgeon: Scherry Ran;  Location: [facility] ORS;  Service: General;  Laterality: N/A;  . COLONOSCOPY N/A 04/15/2015   Procedure: COLONOSCOPY;  Surgeon: Danie Binder, MD;  Location: [facility] ENDO SUITE;  Service: Endoscopy;  Laterality: N/A;  1015 - moved to 10:00 - office to notify - interpreter scheduled, do NOT change time  . ESOPHAGOGASTRODUODENOSCOPY (EGD) WITH PROPOFOL N/A 08/30/2018   Procedure: ESOPHAGOGASTRODUODENOSCOPY  (EGD) WITH PROPOFOL;  Surgeon: Mauri Pole, MD;  Location: [facility] ENDOSCOPY;  Service: Endoscopy;  Laterality: N/A;  . HERNIA REPAIR    . INSERTION OF MESH N/A 01/23/2018   Procedure: INSERTION OF MESH;  Surgeon: Donnie Mesa, MD;  Location: [place];  Service: General;  Laterality: N/A;  . MUSCLE BIOPSY Right 09/04/2018   Procedure: RIGHT THIGH MUSCLE BIOPSY;  Surgeon: Erroll Luna, MD;  Location: [place];  Service: General;  Laterality: Right;  . UMBILICAL HERNIA REPAIR N/A 01/23/2018   Procedure: UMBILICAL HERNIA REPAIR WITH MESH;  Surgeon: Donnie Mesa, MD;  Location: [place];  Service: General;  Laterality: N/A;  . VARICOSE VEIN SURGERY  2011   [facility]    Assessment & Plan Clinical Impression: Patient is a 68 y.o. year old female with history of GERD and recently diagnosed dermatomyositis was discharged in the hospital 08/18/2018 maintained on prednisone after case has been discussed with Dr.Herrion rheumatology at [facility].  History taken from chart review and family due to language and?  Admission. 1 level home one-step to entry.  Daughter had reported patient independent since most recent discharge.  Presented 08/25/2018 with dysphagia with limited oral intake for 1-2 days.  COVID negative, CK 8335.  Chest x-ray negative.  Ultrasound the abdomen showed hepatic steatosis.  Portal vein was patent.  Elevated CK discussed with rheumatology services felt this would remain elevated for some time even after treatment.  Gastroenterology services consulted.  Feeding tube was placed for nutritional support.  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  EGD completed no endoscopic abnormality evident in the esophagus.  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  Placed on intravenous Protonix.  Diet has been advanced to dysphagia #1 thin liquid diet.  Case was discussed with rheumatology service who  recommended continuing steroids and the patient was placed on IVIG transition to p.o. Solu-Medrol.  CK trending down to 872.  Subcutaneous Lovenox for DVT prophylaxis. .  Patient transferred to CIR on 09/22/2018 .    Patient currently requires mod - +2 assistr with basic self-care skills secondary to muscle weakness, decreased cardiorespiratoy endurance, decreased coordination and decreased sitting balance, decreased standing balance and decreased balance strategies.  Prior to hospitalization, patient could complete ADLs and IADLs with independent .  Patient will benefit from skilled intervention to decrease level of assist with basic self-care skills prior to discharge home with care partner.  Anticipate patient will require 24 hour supervision and minimal physical assistance and follow up home health.  OT - End of Session Activity Tolerance: Decreased this session Endurance Deficit: Yes Endurance Deficit Description: Requires rest breaks between activities OT Assessment Rehab Potential (ACUTE ONLY): Good OT Barriers to Discharge: Other (comments) OT Barriers to Discharge Comments: none known at this time OT Patient demonstrates impairments in the following area(s): Balance;Endurance;Motor;Pain;Perception;Safety;Sensory OT Basic ADL's Functional Problem(s): Grooming;Bathing;Dressing;Toileting;Eating OT Transfers Functional Problem(s): Toilet;Tub/Shower OT Plan OT Intensity: Minimum of 1-2 x/day, 45 to 90 minutes OT Frequency: 5 out of 7 days OT Duration/Estimated Length of Stay: 14- 17 days OT Treatment/Interventions: Balance/vestibular training;DME/adaptive equipment instruction;Patient/family education;Therapeutic Activities;Wheelchair propulsion/positioning;Functional electrical stimulation;Psychosocial support;Therapeutic Exercise;Community reintegration;Functional mobility training;UE/LE Strength taining/ROM;Discharge planning;Neuromuscular re-education;Self Care/advanced ADL  retraining;UE/LE Coordination activities;Pain management OT Self Feeding Anticipated Outcome(s): supervision OT Basic Self-Care Anticipated Outcome(s): S - min A OT Toileting Anticipated Outcome(s): min A OT Bathroom Transfers Anticipated Outcome(s): min A OT Recommendation Recommendations for Other Services: (none at this time) Follow Up Recommendations: Home health OT;24 hour supervision/assistance Equipment Recommended: Tub/shower bench   Skilled Therapeutic Intervention Upon entering the room, pt seated in recliner chair with daughter and interpreter present in room. Pt agreeable to OT intervention and very motivated for self care tasks. Sit >stand from low recliner chair with mod A of 2 and use of stedy. OT transferred pt into bathroom and onto TTB for bathing tasks while seated. Pt needing assistance to wash buttocks, peri area, and B feet. Pt having difficulty washing hair as well secondary to decreased ROM for B UEs but pt continues to attempt throughout. Pt standing from elevated stedy seat with mod A of 1 for sit <>stand. Pt transferred to sitting on EOB for dressing tasks with close supervision for static sitting balance. Sit >supine with mod A for B LEs at end of session. Pt fatigues quickly with tasks and needing rest break. OT educated pt and caregiver on OT purpose, POC, and goals with them verbalizing understanding and agreement. Pt requesting need for toileting, max squat pivot transfer onto drop arm commode chair. +2 assistance for clothing management and hygiene with balance. Pt returning to bed in same manner. Call bell and all needed items within reach. Bed alarm activated.   OT Evaluation Precautions/Restrictions  Precautions Precautions: Fall Restrictions Weight Bearing Restrictions: No General   Vital Signs Therapy Vitals Temp: 98.1 F (36.7 C) Temp Source: Oral Pulse Rate: (!) 103 Resp: 18 BP: 114/69 Patient Position (if appropriate): Sitting Oxygen  Therapy SpO2: 100 % O2 Device: Room Air Pain Pain Assessment Pain Scale: 0-10 Pain Score: 0-No pain Home Living/Prior Functioning Home Living Living Arrangements: Spouse/significant other, Children Available Help at  Discharge: Family Type of Home: House Home Access: Stairs to enter Technical brewer of Steps: 1 Entrance Stairs-Rails: None Home Layout: One level Bathroom Shower/Tub: Chiropodist: Standard Bathroom Accessibility: Yes Additional Comments: family purchased DME because a fellow neighbor was going to throw it away. Son works but lives in the home. husband works nearby but most days patient is home alone, so her daughter is going to stay with her during the day at d/c.  Lives With: Spouse Prior Function Level of Independence: Independent with gait, Independent with transfers  Able to Take Stairs?: Yes Vocation: Unemployed Comments: Daughter reports pt was independent with mobility, however, moved very slowly.  ADL   Vision Baseline Vision/History: No visual deficits Patient Visual Report: No change from baseline Vision Assessment?: No apparent visual deficits Perception  Perception: Within Functional Limits Praxis Praxis: Intact Cognition Overall Cognitive Status: Within Functional Limits for tasks assessed Arousal/Alertness: Awake/alert Orientation Level: Person;Situation;Place Person: Oriented Place: Oriented Situation: Oriented Year: 2020 Month: September Day of Week: Correct Memory: Appears intact Memory Impairment: Decreased short term memory Decreased Short Term Memory: Verbal basic Immediate Memory Recall: Sock;Blue;Bed Memory Recall Sock: Without Cue Memory Recall Blue: Without Cue Memory Recall Bed: Without Cue Awareness: Appears intact Problem Solving: Appears intact Executive Function: Reasoning;Sequencing;Initiating Reasoning: Appears intact Sequencing: Appears intact Initiating: Appears intact Behaviors: Other  (comment) Safety/Judgment: Appears intact Comments: Patient is anxious about standing, only participated with standing when her daughter was assiting with PT. He daughter is very attentive and eager to help. Sensation Sensation Light Touch: Impaired by gross assessment Light Touch Impaired Details: Impaired RLE;Impaired LLE Proprioception: Appears Intact Additional Comments: Reported decreased sensation in B LEs from below the knee to her feet, but reports she is still able to feel light touch Coordination Gross Motor Movements are Fluid and Coordinated: No Fine Motor Movements are Fluid and Coordinated: No Coordination and Movement Description: increased UE shaking/tremors L>R and generalized weakness Motor  Motor Motor: Other (comment) Motor - Skilled Clinical Observations: generalized weakness Mobility  Transfers Sit to Stand: Moderate Assistance - Patient 50-74%;2 Helpers Stand to Sit: Moderate Assistance - Patient 50-74%;2 Helpers  Trunk/Postural Assessment  Cervical Assessment Cervical Assessment: Exceptions to WFL(forward head) Thoracic Assessment Thoracic Assessment: Exceptions to WFL(rounded shoulders) Lumbar Assessment Lumbar Assessment: Exceptions to WFL(posterior pelvic tilt) Postural Control Postural Control: Deficits on evaluation  Balance Balance Balance Assessed: Yes Static Sitting Balance Static Sitting - Balance Support: Right upper extremity supported;Left upper extremity supported;Feet supported Static Sitting - Level of Assistance: 5: Stand by assistance Dynamic Sitting Balance Dynamic Sitting - Balance Support: Right upper extremity supported;Left upper extremity supported;Feet supported;During functional activity Dynamic Sitting - Level of Assistance: 4: Min assist Dynamic Sitting - Balance Activities: Lateral lean/weight shifting;Forward lean/weight shifting;Reaching for Consulting civil engineer Standing - Balance Support: Bilateral upper  extremity supported;During functional activity Static Standing - Level of Assistance: 4: Min assist Dynamic Standing Balance Dynamic Standing - Balance Support: Bilateral upper extremity supported;During functional activity Dynamic Standing - Level of Assistance: 4: Min assist Dynamic Standing - Balance Activities: Lateral lean/weight shifting;Forward lean/weight shifting Extremity/Trunk Assessment RUE Assessment RUE Assessment: Exceptions to [facility] Passive Range of Motion (PROM) Comments: Able to achieve ~110-120 degrees shoulder flexion in sitting Active Range of Motion (AROM) Comments: Only able to bring UE to ~70-80 degrees of shoulder flexion in sitting General Strength Comments: Grossly in sitting at least 3-/5 throughout LUE Assessment LUE Assessment: Exceptions to [facility] Passive Range of Motion (PROM) Comments: Able to achieve ~110-120  degrees shoulder flexion in sitting Active Range of Motion (AROM) Comments: Only able to bring UE to ~70-80 degrees of shoulder flexion in sitting General Strength Comments: Grossly in sitting at least 3-/5 throughout     Refer to Care Plan for Long Term Goals  Recommendations for other services: None    Discharge Criteria: Patient will be discharged from OT if patient refuses treatment 3 consecutive times without medical reason, if treatment goals not met, if there is a change in medical status, if patient makes no progress towards goals or if patient is discharged from hospital.  The above assessment, treatment plan, treatment alternatives and goals were discussed and mutually agreed upon: by patient and by family  Gypsy Decant 09/23/2018, 4:44 PM

## 2018-09-23 NOTE — Evaluation (Signed)
Speech Language Pathology Assessment and Plan  Patient Details  Name: Lisa Mcdowell MRN: [id number] Date of Birth: 07/29/1950  SLP Diagnosis: Dysphagia  Rehab Potential: Excellent ELOS: 10-14 days    Today's Date: 09/23/2018 SLP Individual Time: 7106-2694 SLP Individual Time Calculation (min): 62 min   Problem List:  Patient Active Problem List   Diagnosis Date Noted  . 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 Pressure injury of skin 09/17/2018  . Thrombocytopenia ([place]) 09/06/2018  . Transaminitis 08/26/2018  . Elevated liver enzymes   . Dysphagia 08/25/2018  . 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 Encounter for screening colonoscopy 03/24/2015  . Essential tremor 02/06/2015  . Hyperlipidemia 10/28/2014  . GERD (gastroesophageal reflux disease) 10/28/2014  . Hemorrhoids 10/28/2014  . Gallstones 08/08/2010   Past Medical History:  Past Medical History:  Diagnosis Date  . Cholelithiasis   . Dermatomycosis   . Gallstones 08/08/2010  . GERD (gastroesophageal reflux disease)    pt denies having GERD on day of surgery  . Hyperlipidemia   . Nausea & vomiting    Past Surgical History:  Past Surgical History:  Procedure Laterality Date  . BIOPSY  08/30/2018   Procedure: BIOPSY;  Surgeon: Mauri Pole, MD;  Location: [facility] ENDOSCOPY;  Service: Endoscopy;;  . CHOLECYSTECTOMY  08/08/2010   Procedure: LAPAROSCOPIC CHOLECYSTECTOMY;  Surgeon: Scherry Ran;  Location: [facility] ORS;  Service: General;  Laterality: N/A;  . COLONOSCOPY N/A 04/15/2015   Procedure: COLONOSCOPY;  Surgeon: Danie Binder, MD;  Location: [facility] ENDO SUITE;  Service: Endoscopy;  Laterality: N/A;  1015 - moved to 10:00 - office to notify - interpreter scheduled, do NOT change time  . ESOPHAGOGASTRODUODENOSCOPY (EGD) WITH PROPOFOL N/A 08/30/2018   Procedure: ESOPHAGOGASTRODUODENOSCOPY (EGD) WITH PROPOFOL;  Surgeon: Mauri Pole, MD;  Location: [facility] ENDOSCOPY;   Service: Endoscopy;  Laterality: N/A;  . HERNIA REPAIR    . INSERTION OF MESH N/A 01/23/2018   Procedure: INSERTION OF MESH;  Surgeon: Donnie Mesa, MD;  Location: [place];  Service: General;  Laterality: N/A;  . MUSCLE BIOPSY Right 09/04/2018   Procedure: RIGHT THIGH MUSCLE BIOPSY;  Surgeon: Erroll Luna, MD;  Location: [place];  Service: General;  Laterality: Right;  . UMBILICAL HERNIA REPAIR N/A 01/23/2018   Procedure: UMBILICAL HERNIA REPAIR WITH MESH;  Surgeon: Donnie Mesa, MD;  Location: [place];  Service: General;  Laterality: N/A;  . VARICOSE VEIN SURGERY  2011   [facility]    Assessment / Plan / Recommendation Clinical Impression   HPI: Lisa Mcdowell 68 year old right-handed female with history of GERD and recently diagnosed dermatomyositis was discharged in the hospital 08/18/2018 maintained on prednisone after case has been discussed with Dr.Herrion rheumatology at [facility].  History taken from chart review and family due to language and?  Admission. 1 level home one-step to entry.  Daughter had reported patient independent since most recent discharge.  Presented 08/25/2018 with dysphagia with limited oral intake for 1-2 days.  COVID negative, CK 8335.  Chest x-ray negative.  Ultrasound the abdomen showed hepatic steatosis.  Portal vein was patent.  Elevated CK discussed with rheumatology services felt this would remain elevated for some time even after treatment.  Gastroenterology services consulted.  Feeding tube was placed for nutritional support.  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  EGD completed no endoscopic abnormality evident in the esophagus.  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  Placed on intravenous Protonix.  Diet has been advanced to dysphagia #1 thin liquid diet.  Case was discussed with rheumatology service who recommended continuing steroids and the patient was  placed on IVIG transition to p.o. Solu-Medrol.  CK trending down to 872.  Subcutaneous Lovenox for DVT prophylaxis.  Pt was admitted to CIR 09/22/18 and ST evaluations were completed 09/23/18 with results as follows:  Clinical bedside swallow evaluation: Pt presents with Mild dysphagia. Per recent MBS (09/23/18) dysphagia appears secondary to esophageal concerns and globus sensation causes pt anxiety related to swallowing. Pt consumed thin liquids, puree, dysphagia 2 (soft minced), and dysphagia 3 (mech soft) POs with no overt s/s aspiration. Pt's mastication was slightly prolonged with dys 3 soft solid, however functional. Given pt's history and presentation, recommend pt upgrade to dysphagia 2 diet, continue thin liquids and meds crushed in puree. Follow solids with liquids, and use slow rate.   Cognitive-Linguistic evaluation: Although assessment was somewhat limited due to pt's daughter providing intermittent cues during subtests of the Cognistat, pt's cognition appears to be at baseline level of functioning. Pt and daughter do not report any perceived changes in cognition, and during functional self-care tasks, mobility, and conversation pt demonstrated appropriate reasoning, sequencing, intellectual awareness, and problem solving. SLP provided education regarding functional independence during cognitive tasks at home.  Recommend pt receive skilled ST for dysphagia only while inpatient to reduce burden of care and maximize swallow function/safety.   Skilled Therapeutic Interventions          Clinical bedside swallow and cognitive-linguistic evaluations were completed and results reviewed with pt and daughter/please see above for details.    SLP Assessment  Patient will need skilled Speech Lanaguage Pathology Services during CIR admission    Recommendations  SLP Diet Recommendations: Dysphagia 2 (Fine chop) Liquid Administration via: Cup;Straw Medication Administration: Crushed with puree Supervision:  Patient able to self feed Compensations: Minimize environmental distractions;Slow rate;Small sips/bites;Follow solids with liquid Postural Changes and/or Swallow Maneuvers: Seated upright 90 degrees Oral Care Recommendations: Oral care BID Patient destination: Home Follow up Recommendations: Other (comment)(TBD) Equipment Recommended: None recommended by SLP    SLP Frequency 3 to 5 out of 7 days   SLP Duration  SLP Intensity  SLP Treatment/Interventions 10-14 days  Minumum of 1-2 x/day, 30 to 90 minutes  Dysphagia/aspiration precaution training;Patient/family education    Pain Pain Assessment Pain Scale: 0-10 Pain Score: 0-No pain  Prior Functioning    Short Term Goals: Week 1: SLP Short Term Goal 1 (Week 1): Pt will consume dysphagia 2, thin liquid diet with minimal overt s/s aspiration and Min cues for use of swallow strategies.  Refer to Care Plan for Long Term Goals  Recommendations for other services: None   Discharge Criteria: Patient will be discharged from SLP if patient refuses treatment 3 consecutive times without medical reason, if treatment goals not met, if there is a change in medical status, if patient makes no progress towards goals or if patient is discharged from hospital.  The above assessment, treatment plan, treatment alternatives and goals were discussed and mutually agreed upon: by patient and family (daughter)  Arbutus Leas 09/23/2018, 12:27 PM

## 2018-09-23 NOTE — Care Management Note (Signed)
Inpatient Rehabilitation Center Individual Statement of Services  Patient Name:  Lisa Mcdowell  Date:  09/23/2018  Welcome to the [facility].  Our goal is to provide you with an individualized program based on your diagnosis and situation, designed to meet your specific needs.  With this comprehensive rehabilitation program, you will be expected to participate in at least 3 hours of rehabilitation therapies Monday-Friday, with modified therapy programming on the weekends.  Your rehabilitation program will include the following services:  Physical Therapy (PT), Occupational Therapy (OT), Speech Therapy (ST), 24 hour per day rehabilitation nursing, Case Management (Social Worker), Rehabilitation Medicine, Nutrition Services and Pharmacy Services  Weekly team conferences will be held on Wednesday to discuss your progress.  Your Social Worker will talk with you frequently to get your input and to update you on team discussions.  Team conferences with you and your family in attendance may also be held.  Expected length of stay: 14-17 days  Overall anticipated outcome: min-CGA level  Depending on your progress and recovery, your program may change. Your Social Worker will coordinate services and will keep you informed of any changes. Your Social Worker's name and contact numbers are listed  below.  The following services may also be recommended but are not provided by the [facility]:    [place] will be made to provide these services after discharge if needed.  Arrangements include referral to agencies that provide these services.  Your insurance has been verified to be:  Self Pay Your primary doctor is:  Health Dept  Pertinent information will be shared with your doctor and your insurance company.  Social Worker:  Ovidio Kin, [place] or ([phone]  Information discussed with and copy given to patient by: Elease Hashimoto, 09/23/2018, 12:47 PM

## 2018-09-23 NOTE — Progress Notes (Signed)
[facility] PHYSICAL MEDICINE & REHABILITATION PROGRESS NOTE   Subjective/Complaints: Pt reports she needs to go pee/go to bathroom- in spanish. Denies any other issues. Denies pain.   Objective:   Vas [place] Lower Extremity Venous (dvt)  Result Date: 09/23/2018  Lower Venous Study Indications: Edema.  Comparison Study: no prior Performing Technologist: June Leap RDMS, RVT  Examination Guidelines: A complete evaluation includes B-mode imaging, spectral Doppler, color Doppler, and power Doppler as needed of all accessible portions of each vessel. Bilateral testing is considered an integral part of a complete examination. Limited examinations for reoccurring indications may be performed as noted.  +---------+---------------+---------+-----------+----------+--------------+ RIGHT    CompressibilityPhasicitySpontaneityPropertiesThrombus Aging +---------+---------------+---------+-----------+----------+--------------+ CFV      Full           Yes      Yes                                 +---------+---------------+---------+-----------+----------+--------------+ SFJ      Full                                                        +---------+---------------+---------+-----------+----------+--------------+ FV Prox  Full                                                        +---------+---------------+---------+-----------+----------+--------------+ FV Mid   Full                                                        +---------+---------------+---------+-----------+----------+--------------+ FV DistalFull                                                        +---------+---------------+---------+-----------+----------+--------------+ PFV      Full                                                        +---------+---------------+---------+-----------+----------+--------------+ POP      Full           Yes      Yes                                  +---------+---------------+---------+-----------+----------+--------------+ PTV      Full                                                        +---------+---------------+---------+-----------+----------+--------------+ PERO  Full                                                        +---------+---------------+---------+-----------+----------+--------------+   +---------+---------------+---------+-----------+----------+--------------+ LEFT     CompressibilityPhasicitySpontaneityPropertiesThrombus Aging +---------+---------------+---------+-----------+----------+--------------+ CFV      Full           Yes      Yes                                 +---------+---------------+---------+-----------+----------+--------------+ SFJ      Full                                                        +---------+---------------+---------+-----------+----------+--------------+ FV Prox  Full                                                        +---------+---------------+---------+-----------+----------+--------------+ FV Mid   Full                                                        +---------+---------------+---------+-----------+----------+--------------+ FV DistalFull                                                        +---------+---------------+---------+-----------+----------+--------------+ PFV      Full                                                        +---------+---------------+---------+-----------+----------+--------------+ POP      Full           Yes      Yes                                 +---------+---------------+---------+-----------+----------+--------------+ PTV      Full                                                        +---------+---------------+---------+-----------+----------+--------------+ PERO     Full                                                         +---------+---------------+---------+-----------+----------+--------------+  Summary: Right: There is no evidence of deep vein thrombosis in the lower extremity. No cystic structure found in the popliteal fossa. Left: There is no evidence of deep vein thrombosis in the lower extremity. No cystic structure found in the popliteal fossa.  *See table(s) above for measurements and observations.    Preliminary    Recent Labs    09/22/18 1433 09/23/18 0508  WBC 7.1 5.0  HGB 10.7* 9.8*  HCT 33.2* 30.7*  PLT 180 153   Recent Labs    09/22/18 1433 09/23/18 0508  NA  --  139  K  --  3.8  CL  --  105  CO2  --  27  GLUCOSE  --  83  BUN  --  20  CREATININE 0.49 0.45  CALCIUM  --  8.1*    Intake/Output Summary (Last 24 hours) at 09/23/2018 1648 Last data filed at 09/23/2018 1356 Gross per 24 hour  Intake 240 ml  Output -  Net 240 ml     Physical Exam: Vital Signs Blood pressure 114/69, pulse (!) 103, temperature 98.1 F (36.7 C), temperature source Oral, resp. rate 18, weight 70.4 kg, SpO2 100 %. Physical Exam  Vitals and labs reviewed. Constitutional: awake, alert, appropriate, sitting up in chair at bedside, asking repeatedly to go to bathroom- got nurse for her- per nurse, 2 person assist, NAD HENT:  Head: Normocephalic and atraumatic.  Eyes: EOM are normal.  Neck: No tracheal deviation present. No thyromegaly present.  CV_ borderline tachycardia, regular rhythm Respiratory: CTA B/L GI: She exhibits no distension.  Musculoskeletal:     Comments: No edema or tenderness in extremities  Neurological: She is alert.  Patient is alert sitting up in chair.   Makes good eye contact with examiner.   She can repeat some simple words.   Has some difficulty following commands, even with translation Motor: Appears to be bilateral shoulder adduction 2+/5, elbow flexion/extension 4-/5, handgrip 4/5 Bilateral hip flexion 2/5, knee extension 2+/5, ankle dorsiflexion 4+/5 Sensation  diminished to light touch bilateral feet  Skin: Skin is warm and dry.  Psychiatric:  Flat affect     Assessment/Plan: 1. 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  Physiatrist is providing close team supervision and 24 hour management of active medical problems listed below.  Physiatrist and rehab team continue to assess barriers to discharge/monitor patient progress toward functional and medical goals  Care Tool:  Bathing    Body parts bathed by patient: Right arm, Left arm, Chest, Abdomen, Right upper leg, Left upper leg   Body parts bathed by helper: Buttocks, Right lower leg, Left lower leg, Front perineal area     Bathing assist Assist Level: Maximal Assistance - Patient 24 - 49%     Upper Body Dressing/Undressing Upper body dressing   What is the patient wearing?: Pull over shirt    Upper body assist Assist Level: Moderate Assistance - Patient 50 - 74%    Lower Body Dressing/Undressing Lower body dressing      What is the patient wearing?: Underwear/pull up     Lower body assist Assist for lower body dressing: Total Assistance - Patient < 25%     Toileting Toileting    Toileting assist Assist for toileting: Maximal Assistance - Patient 25 - 49%     Transfers Chair/bed transfer  Transfers assist     Chair/bed transfer assist level: Moderate Assistance - Patient  50 - 74%     Locomotion Ambulation   Ambulation assist      Assist level: Minimal Assistance - Patient > 75% Assistive device: Walker-rolling Max distance: 60'   Walk 10 feet activity   Assist     Assist level: Minimal Assistance - Patient > 75% Assistive device: Walker-rolling   Walk 50 feet activity   Assist    Assist level: Minimal Assistance - Patient > 75% Assistive device: Walker-rolling    Walk 150 feet activity   Assist Walk 150 feet activity did  not occur: Safety/medical concerns(decreased strength/activity tolerance)         Walk 10 feet on uneven surface  activity   Assist Walk 10 feet on uneven surfaces activity did not occur: Safety/medical concerns(decreased strength/activity tolerance)         Wheelchair     Assist   Type of Wheelchair: Manual    Wheelchair assist level: Set up assist, Supervision/Verbal cueing Max wheelchair distance: 150'    Wheelchair 50 feet with 2 turns activity    Assist        Assist Level: Set up assist, Supervision/Verbal cueing   Wheelchair 150 feet activity     Assist   CBG (last 3)  Recent Labs    09/22/18 2140 09/23/18 0646 09/23/18 1139  GLUCAP 160* 94 154*        Assist Level: Set up assist, Supervision/Verbal cueing   Blood pressure 114/69, pulse (!) 103, temperature 98.1 F (36.7 C), temperature source Oral, resp. rate 18, weight 70.4 kg, SpO2 100 %.  Medical Problem List and Plan: 1.  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  Current plan is prolonged course of Solu-Medrol 6 weeks followed by taper and follow-up rheumatology services Dr [name] at [place] to CIR 2.  Antithrombotics: -DVT/anticoagulation: Subcutaneous Lovenox.               Check vascular study             -antiplatelet therapy: N/A 3. Pain Management: Tylenol as needed 4. Mood: Provide emotional support             -antipsychotic agents: N/A 5. Neuropsych: This patient is?  Fully capable of making decisions on her own behalf. 6. Skin/Wound Care: Routine skin checks 7. Fluids/Electrolytes/Nutrition: Routine I/Os.  CMP ordered for tomorrow a.m. ; of note, no leukocytosis.  9/8- Ca low at 8.1; otherwise well BMP; ALT/AST somewhat elevated- will monitor 8.  Dysphagia.  Dysphagia #1 thin liquids.  Follow-up speech therapy.  Advance diet as tolerated 9.  Steroid-induced hyperglycemia: Sliding  scale insulin.  Monitor with taper  9/8- BGs 94-160s- will con't to monitor/SSI   10. Dispo  9/8- Needs translator for Spanish  LOS: 1 days A FACE TO FACE EVALUATION WAS PERFORMED  Scottlyn Mchaney 09/23/2018, 4:48 PM

## 2018-09-23 NOTE — Progress Notes (Addendum)
In regards to patient's steroid therapy for dermatomyositis she will continue on 100 mg daily high dose steroid therapy x6 weeks from 08/25/2018 followed by taper approximately September 21 and follow-up rheumatology services at [place]

## 2018-09-23 NOTE — Progress Notes (Signed)
Social Work Assessment and Plan   Patient Details  Name: Lisa Mcdowell MRN: [id number] Date of Birth: 1950-09-15  Today's Date: 09/23/2018  Problem List:  Patient Active Problem List   Diagnosis Date Noted  . Dermatomyositis ([place]) 09/22/2018  . Pressure injury of skin 09/17/2018  . Thrombocytopenia ([place]) 09/06/2018  . Transaminitis 08/26/2018  . Elevated liver enzymes   . Dysphagia 08/25/2018  . 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 Encounter for screening colonoscopy 03/24/2015  . Essential tremor 02/06/2015  . Hyperlipidemia 10/28/2014  . GERD (gastroesophageal reflux disease) 10/28/2014  . Hemorrhoids 10/28/2014  . Gallstones 08/08/2010   Past Medical History:  Past Medical History:  Diagnosis Date  . Cholelithiasis   . Dermatomycosis   . Gallstones 08/08/2010  . GERD (gastroesophageal reflux disease)    pt denies having GERD on day of surgery  . Hyperlipidemia   . Nausea & vomiting    Past Surgical History:  Past Surgical History:  Procedure Laterality Date  . BIOPSY  08/30/2018   Procedure: BIOPSY;  Surgeon: Mauri Pole, MD;  Location: [facility] ENDOSCOPY;  Service: Endoscopy;;  . CHOLECYSTECTOMY  08/08/2010   Procedure: LAPAROSCOPIC CHOLECYSTECTOMY;  Surgeon: Scherry Ran;  Location: [facility] ORS;  Service: General;  Laterality: N/A;  . COLONOSCOPY N/A 04/15/2015   Procedure: COLONOSCOPY;  Surgeon: Danie Binder, MD;  Location: [facility] ENDO SUITE;  Service: Endoscopy;  Laterality: N/A;  1015 - moved to 10:00 - office to notify - interpreter scheduled, do NOT change time  . ESOPHAGOGASTRODUODENOSCOPY (EGD) WITH PROPOFOL N/A 08/30/2018   Procedure: ESOPHAGOGASTRODUODENOSCOPY (EGD) WITH PROPOFOL;  Surgeon: Mauri Pole, MD;  Location: [facility] ENDOSCOPY;  Service: Endoscopy;  Laterality: N/A;  . HERNIA REPAIR    . INSERTION OF MESH N/A 01/23/2018   Procedure: INSERTION OF MESH;  Surgeon: Donnie Mesa, MD;  Location: [place];  Service: General;  Laterality: N/A;  . MUSCLE BIOPSY Right 09/04/2018   Procedure: RIGHT THIGH MUSCLE BIOPSY;  Surgeon: Erroll Luna, MD;  Location: [place];  Service: General;  Laterality: Right;  . UMBILICAL HERNIA REPAIR N/A 01/23/2018   Procedure: UMBILICAL HERNIA REPAIR WITH MESH;  Surgeon: Donnie Mesa, MD;  Location: [place];  Service: General;  Laterality: N/A;  . [place]  2011   [facility]   Social History:  reports that she has never smoked. She has never used smokeless tobacco. She reports that she does not drink alcohol or use drugs.  Family / Support Systems Marital Status: Married Patient Roles: Spouse, Parent, Other (Comment) Spouse/Significant Other: Husband Children: Lisa Mcdowell-daughter [phone]- cell  Lisa Mcdowell-son [phone] Other Supports: Friends and neighbors Anticipated Caregiver: Lisa Mcdowell and Lisa Mcdowell Ability/Limitations of Caregiver: Lisa Mcdowell works three jobs-but is off for the next 4 weeks and then she and Lisa Mcdowell will work on flexing their schedules. Pt's husband works day time Careers adviser: Other (Comment)(Working on 24 hr care) Family Dynamics: Close knit family who pull together in times of need and do for each other. Pt has prided herself on being independent and taking care of herself. She certainly does not want to burden her children or her husband. They do have support via friends and neighbors.  Social History Preferred language: Spanish Religion:  Cultural Background: Pt has been here for 13 years and does not speak English-will need interpreter while here Education: Schooling in [place] Read: Yes(Spanish) Write: Yes(Spanish) Employment Status: Unemployed Public relations account executive Issues: No issues-undocumented and not eligible for Medicaid.  Financial counselor working with them on resources-charity here Guardian/Conservator: None-according to MD pt is not fully capable of making her own  decisions while here, will look toward her family/husband to make decisions while here. Daughter plans to be here daily while here   Abuse/Neglect Abuse/Neglect Assessment Can Be Completed: Yes Physical Abuse: Denies Verbal Abuse: Denies Sexual Abuse: Denies Exploitation of patient/patient's resources: Denies Self-Neglect: Denies  Emotional Status Pt's affect, behavior and adjustment status: Pt is motivated to do well here and be able to do for herself since this is what she has always done. Her family is involved and willing to assist. She is glad to be here on the rehab unit and feels very fortuante to be here. Daughter is here with her and observing her in therapies. Recent Psychosocial Issues: other health issues multiple admits to the hospital in the past few months Psychiatric History: No history deferred depression screen due to adjusting the the new unit and coping appropriately with her hospitalization. Will ask input from team and see if would benefit from seeing neuro-psych while here Substance Abuse History: No issues  Patient / Family Perceptions, Expectations & Goals Pt/Family understanding of illness & functional limitations: Pt and daughter can explain her treatment and plans going forward. Daughter does talk with the MD and feels she and her Mom have a good understanding of what to expect and hopeful she will do well here on rehab. Premorbid pt/family roles/activities: Wife, mother, retiree, friend, neighbor, etc Anticipated changes in roles/activities/participation: resume Pt/family expectations/goals: Pt states: " I am so grateful to be here."  Daughter states: " I hope she does well and can get her strength back while here."  [employer]: Other (Comment)(Will follow up at [facility] upon [place] from rehab) Premorbid Home Care/DME Agencies: Other (Comment)(has rw and cane) Transportation available at discharge: Family provides  Discharge  Planning Living Arrangements: Spouse/significant other, Children Support Systems: Spouse/significant other, Children, Other relatives, Friends/neighbors, Church/faith community Type of Residence: Private residence Insurance Resources: Teacher, adult education Resources: Family Support Financial Screen Referred: Previously completed Living Expenses: Rent Money Management: Spouse Does the patient have any problems obtaining your medications?: Yes (Describe)(uninsured) Home Management: Pt and family will do now Patient/Family Preliminary Plans: Return home with husband and son, daughter to assist when able. Daughter and son will try to flex thier schedules to provide 24 hr supervision upon discharge home atleast for a short time. Son works 5a-4 p and daughter works several jobs. Pt's hsband works during the day. Will await therapy team evaluations and work on discharge needs. Sw Barriers to Discharge: Insurance for SNF coverage Sw Barriers to Discharge Comments: No eligible for Medicaid Social Work Anticipated Follow Up Needs: HH/OP  Clinical Impression Pleasant female who is willing to work in therapies and progress. Her daughter is here observing today and providing answers to the therapists. Both are very grateful pt is here and able to participate in the program. Will await therapy team evaluations and work on discharge needs. Will see can get into [facility] for PCP follow  Zackrey Dyar, Gardiner Rhyme 09/23/2018, 1:21 PM

## 2018-09-23 NOTE — Evaluation (Signed)
Physical Therapy Assessment and Plan  Patient Details  Name: Lisa Mcdowell MRN: [id number] Date of Birth: [birth year]  PT Diagnosis: Abnormal posture, Abnormality of gait, Coordination disorder, Difficulty walking, Edema, Hypotonia, Impaired sensation and Muscle weakness Rehab Potential: Excellent ELOS: 2 weeks   Today's Date: 09/23/2018 PT Individual Time: 0900-1000 PT Individual Time Calculation (min): 60 min    Problem List:  Patient Active Problem List   Diagnosis Date Noted  . 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 Pressure injury of skin 09/17/2018  . Thrombocytopenia ([place]) 09/06/2018  . Transaminitis 08/26/2018  . Elevated liver enzymes   . Dysphagia 08/25/2018  . 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 Encounter for screening colonoscopy 03/24/2015  . Essential tremor 02/06/2015  . Hyperlipidemia 10/28/2014  . GERD (gastroesophageal reflux disease) 10/28/2014  . Hemorrhoids 10/28/2014  . Gallstones 08/08/2010    Past Medical History:  Past Medical History:  Diagnosis Date  . Cholelithiasis   . Dermatomycosis   . Gallstones 08/08/2010  . GERD (gastroesophageal reflux disease)    pt denies having GERD on day of surgery  . Hyperlipidemia   . Nausea & vomiting    Past Surgical History:  Past Surgical History:  Procedure Laterality Date  . BIOPSY  08/30/2018   Procedure: BIOPSY;  Surgeon: Mauri Pole, MD;  Location: [facility] ENDOSCOPY;  Service: Endoscopy;;  . CHOLECYSTECTOMY  08/08/2010   Procedure: LAPAROSCOPIC CHOLECYSTECTOMY;  Surgeon: Scherry Ran;  Location: [facility] ORS;  Service: General;  Laterality: N/A;  . COLONOSCOPY N/A 04/15/2015   Procedure: COLONOSCOPY;  Surgeon: Danie Binder, MD;  Location: [facility] ENDO SUITE;  Service: Endoscopy;  Laterality: N/A;  1015 - moved to 10:00 - office to notify - interpreter scheduled, do NOT change time  . ESOPHAGOGASTRODUODENOSCOPY (EGD) WITH PROPOFOL N/A 08/30/2018   Procedure:  ESOPHAGOGASTRODUODENOSCOPY (EGD) WITH PROPOFOL;  Surgeon: Mauri Pole, MD;  Location: [facility] ENDOSCOPY;  Service: Endoscopy;  Laterality: N/A;  . HERNIA REPAIR    . INSERTION OF MESH N/A 01/23/2018   Procedure: INSERTION OF MESH;  Surgeon: Donnie Mesa, MD;  Location: [place];  Service: General;  Laterality: N/A;  . MUSCLE BIOPSY Right 09/04/2018   Procedure: RIGHT THIGH MUSCLE BIOPSY;  Surgeon: Erroll Luna, MD;  Location: [facility];  Service: General;  Laterality: Right;  . UMBILICAL HERNIA REPAIR N/A 01/23/2018   Procedure: UMBILICAL HERNIA REPAIR WITH MESH;  Surgeon: Donnie Mesa, MD;  Location: [place];  Service: General;  Laterality: N/A;  . VARICOSE VEIN SURGERY  2011   [facility]    Assessment & Plan Clinical Impression: Patient is a 68 y.o. year old right-handed female with history of GERD and recently diagnosed dermatomyositis was discharged in the hospital 08/18/2018 maintained on prednisone after case has been discussed with Dr.Herrion rheumatology at [facility].  History taken from chart review and family due to language and?  Admission. 1 level home one-step to entry.  Daughter had reported patient independent since most recent discharge.  Presented 08/25/2018 with dysphagia with limited oral intake for 1-2 days.  COVID negative, CK 8335.  Chest x-ray negative.  Ultrasound the abdomen showed hepatic steatosis.  Portal vein was patent.  Elevated CK discussed with rheumatology services felt this would remain elevated for some time even after treatment.  Gastroenterology services consulted.  Feeding tube was placed for nutritional support.  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  EGD completed no endoscopic abnormality evident in the esophagus.  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  Placed on intravenous Protonix.  Diet has been advanced to dysphagia #1 thin liquid diet.  Case was discussed  with rheumatology service who recommended continuing steroids and the patient was placed on IVIG transition to p.o. Solu-Medrol.  CK trending down to 872.  Subcutaneous Lovenox for DVT prophylaxis.  Therapy evaluations completed and patient was admitted for a comprehensive rehab program. Patient transferred to [facility] on 09/22/2018 .   Patient currently requires max with mobility secondary to muscle weakness, decreased cardiorespiratoy endurance and decreased sitting balance, decreased standing balance, decreased postural control and decreased balance strategies.  Prior to hospitalization, patient was independent  with mobility and lived with (P) Spouse in a (P) House home.  Home access is 1(P) Stairs to enter.  Patient will benefit from skilled PT intervention to maximize safe functional mobility, minimize fall risk and decrease caregiver burden for planned discharge home with 24 hour assist.  Anticipate patient will benefit from follow up [facility] at discharge.  PT - End of Session Activity Tolerance: Tolerates 30+ min activity with multiple rests Endurance Deficit: Yes Endurance Deficit Description: Requires rest breaks between activities PT Assessment Rehab Potential (ACUTE/IP ONLY): Excellent PT Patient demonstrates impairments in the following area(s): Balance;Edema;Endurance;Motor;Nutrition;Pain;Perception;Safety;Sensory;Skin Integrity PT Transfers Functional Problem(s): Bed Mobility;Bed to Chair;Car;Furniture;Floor PT Locomotion Functional Problem(s): Ambulation;Wheelchair Mobility;Stairs PT Plan PT Intensity: Minimum of 1-2 x/day ,45 to 90 minutes PT Frequency: 5 out of 7 days PT Duration Estimated Length of Stay: 2 weeks PT Treatment/Interventions: Ambulation/gait training;Discharge planning;Functional mobility training;Psychosocial support;Therapeutic Activities;Balance/vestibular training;Disease management/prevention;Neuromuscular re-education;Skin care/wound management;Therapeutic  Exercise;Wheelchair propulsion/positioning;DME/adaptive equipment instruction;Pain management;Splinting/orthotics;UE/LE Strength taining/ROM;Community reintegration;Functional electrical stimulation;Patient/family education;Stair training;UE/LE Coordination activities PT Transfers Anticipated Outcome(s): min A PT Locomotion Anticipated Outcome(s): CGA 150' with LRAD PT Recommendation Follow Up Recommendations: Home health PT Patient destination: Home Equipment Recommended: To be determined Equipment Details: Patient reports that she has a RW, w/c, and SPC purchased from a neighbor  Skilled Therapeutic Intervention In addition to the PT evaluation below, the patient performed the following skilled PT interventions: Patient in recliner in room with her daughter in the room upon PT arrival. Patient alert and agreeable to PT session. Interpreter provided translation throughout session using video call system in room.   Therapeutic Activity: Transfers: Patient attempted sit to/from stand x1 with max A of 1 person, patient unable to come to standing and did not continue with standing after intiation, stating "I can't." Patient's daughter stated "she likes me to help" and she assisted on second attempt and the patient participated more during the remaining transfers with mod-min A +2 with her daughter assisting. She performed sit to/from stand x2 and stand pivot x1 using the RW. Provided verbal cues for hand placement on RW, reaching back to sit, and leaning forward to stand. She performed a car transfer, performing a squat pivot with PT only, with max A. Required increased encouragement to attempt transfer without her daughters assistance.  She performed a transfer from the w/c to the recliner using the Stedy to assess safety with device for nursing staff. She required mod A of 2 during transfer, noted on safety plan in room.   Gait Training:  Patient ambulated 60 feet using RW with min A. Ambulated as  described below.  Provided verbal cues for looking ahead and erect posture.  Wheelchair Mobility:  Patient propelled wheelchair 150 feet with supervision and set-up assist. Provided verbal cues for propulsion technique and turning.   Patient in recliner at  end of session with breaks locked, chair alarm set, and all needs within reach.   Instructed pt and her daughter in results of PT evaluation as detailed below, PT POC, rehab potential, rehab goals, and discharge recommendations. Additionally discussed CIR's policies regarding fall safety and use of chair alarm and/or quick release belt. Pt and her daughter verbalized understanding and in agreement.    PT Evaluation Precautions/Restrictions Precautions Precautions: Fall Restrictions Weight Bearing Restrictions: No General   Vital SignsTherapy Vitals Temp: 98.1 F (36.7 C) Temp Source: Oral Pulse Rate: (!) 103 Resp: 18 BP: 114/69 Patient Position (if appropriate): Sitting Oxygen Therapy SpO2: 100 % O2 Device: Room Air Pain Pain Assessment Pain Scale: 0-10 Pain Score: 0-No pain Home Living/Prior Functioning Home Living Available Help at Discharge: Family Type of Home: House Home Access: Stairs to enter Technical brewer of Steps: 1 Entrance Stairs-Rails: None Home Layout: One level Bathroom Shower/Tub: Chiropodist: Standard Bathroom Accessibility: Yes Additional Comments: family purchased DME because a fellow neighbor was going to throw it away. Son works but lives in the home. husband works nearby but most days patient is home alone, so her daughter is going to stay with her during the day at d/c.  Lives With: Spouse Prior Function Level of Independence: Independent with gait;Independent with transfers  Able to Take Stairs?: Yes Vocation: Unemployed Vision/Perception  Perception Perception: Within Functional Limits Praxis Praxis: Intact  Cognition Overall Cognitive Status: Within  Functional Limits for tasks assessed Orientation Level: Oriented X4 Memory: Appears intact Memory Impairment: Decreased short term memory Decreased Short Term Memory: Verbal basic Awareness: Appears intact Problem Solving: Appears intact Executive Function: Reasoning;Sequencing;Initiating Reasoning: Appears intact Sequencing: Appears intact Initiating: Appears intact Behaviors: Other (comment) Safety/Judgment: Appears intact Comments: Patient is anxious about standing, only participated with standing when her daughter was assiting with PT. He daughter is very attentive and eager to help. Sensation Sensation Light Touch: Impaired by gross assessment Light Touch Impaired Details: Impaired RLE;Impaired LLE Proprioception: Appears Intact Additional Comments: Reported decreased sensation in B LEs from below the knee to her feet, but reports she is still able to feel light touch Coordination Gross Motor Movements are Fluid and Coordinated: No Fine Motor Movements are Fluid and Coordinated: No Coordination and Movement Description: increased UE shaking/tremors L>R and generalized weakness Motor  Motor Motor: Other (comment) Motor - Skilled Clinical Observations: generalized weakness  Mobility Transfers Transfers: Sit to Stand;Stand to Sit;Stand Pivot Transfers;Squat Pivot Transfers Sit to Stand: Moderate Assistance - Patient 50-74%;2 Helpers Stand to Sit: Moderate Assistance - Patient 50-74%;2 Helpers Stand Pivot Transfers: Moderate Assistance - Patient 50 - 74%;2 Helpers Stand Pivot Transfer Details: Verbal cues for gait pattern;Verbal cues for safe use of DME/AE;Verbal cues for technique;Verbal cues for precautions/safety;Verbal cues for sequencing Stand Pivot Transfer Details (indicate cue type and reason): Provided cues for scooting forward and leaning forward to stand, hand placement on RW, and sequencing for stepping during pivot Squat Pivot Transfers: Maximal Assistance - Patient  25-49% Transfer (Assistive device): Rolling walker Locomotion  Gait Ambulation: Yes Gait Assistance: Minimal Assistance - Patient > 75% Gait Distance (Feet): 60 Feet Assistive device: Rolling walker Gait Assistance Details: Verbal cues for safe use of DME/AE;Verbal cues for precautions/safety;Verbal cues for technique;Tactile cues for posture;Verbal cues for gait pattern Gait Assistance Details: Provided cues for staying close to the RW, erect posture with tacile cues for emphasis, and cues for increased step height. Gait Gait: Yes Gait Pattern: Decreased step length - left;Decreased step length - right;Step-through  pattern;Decreased stride length;Decreased hip/knee flexion - right;Decreased hip/knee flexion - left;Right foot flat;Left foot flat;Right flexed knee in stance;Left flexed knee in stance;Decreased trunk rotation;Narrow base of support;Trunk flexed Gait velocity: decreased Stairs / Additional Locomotion Stairs: No Architect: Yes Wheelchair Assistance: Chartered loss adjuster: Both upper extremities Wheelchair Parts Management: Needs assistance Distance: 150'  Trunk/Postural Assessment  Cervical Assessment Cervical Assessment: Exceptions to WFL(forward head) Thoracic Assessment Thoracic Assessment: Exceptions to WFL(rounded shoulders) Lumbar Assessment Lumbar Assessment: Exceptions to WFL(posterior pelvic tilt) Postural Control Postural Control: Deficits on evaluation(decreased/delayed)  Balance Balance Balance Assessed: Yes Static Sitting Balance Static Sitting - Balance Support: Right upper extremity supported;Left upper extremity supported;Feet supported Static Sitting - Level of Assistance: 5: Stand by assistance Dynamic Sitting Balance Dynamic Sitting - Balance Support: Right upper extremity supported;Left upper extremity supported;Feet supported;During functional activity Dynamic Sitting - Level of  Assistance: 4: Min assist Dynamic Sitting - Balance Activities: Lateral lean/weight shifting;Forward lean/weight shifting;Reaching for Consulting civil engineer Standing - Balance Support: Bilateral upper extremity supported;During functional activity Static Standing - Level of Assistance: 4: Min assist Dynamic Standing Balance Dynamic Standing - Balance Support: Bilateral upper extremity supported;During functional activity Dynamic Standing - Level of Assistance: 4: Min assist Dynamic Standing - Balance Activities: Lateral lean/weight shifting;Forward lean/weight shifting Extremity Assessment  RUE Assessment RUE Assessment: Exceptions to [facility] Passive Range of Motion (PROM) Comments: Able to achieve ~110-120 degrees shoulder flexion in sitting Active Range of Motion (AROM) Comments: Only able to bring UE to ~70-80 degrees of shoulder flexion in sitting General Strength Comments: Grossly in sitting at least 4-4+/5 throughout LUE Assessment LUE Assessment: Exceptions to [facility] Passive Range of Motion (PROM) Comments: Able to achieve ~110-120 degrees shoulder flexion in sitting Active Range of Motion (AROM) Comments: Only able to bring UE to ~70-80 degrees of shoulder flexion in sitting General Strength Comments: Grossly in sitting at least 4+/5 throughout RLE Assessment RLE Assessment: Exceptions to [facility] Active Range of Motion (AROM) Comments: WFL for functional mobility General Strength Comments: Grossly in sitting: hip flexion 2-/5, knee flexion/extension 4+/5, DF/PF 4+/5 LLE Assessment LLE Assessment: Exceptions to [facility] Active Range of Motion (AROM) Comments: WFL for functional mobility General Strength Comments: Grossly in sitting: hip flexion 3/5, knee flexion/extension 5/5, DF/PF 4+/5    Refer to Care Plan for Long Term Goals  Recommendations for other services: None   Discharge Criteria: Patient will be discharged from PT if patient refuses treatment 3 consecutive times  without medical reason, if treatment goals not met, if there is a change in medical status, if patient makes no progress towards goals or if patient is discharged from hospital.  The above assessment, treatment plan, treatment alternatives and goals were discussed and mutually agreed upon: by patient and by family  Doreene Burke PT, DPT  09/23/2018, 4:17 PM

## 2018-09-23 NOTE — Plan of Care (Signed)
  Problem: Consults Goal: RH GENERAL PATIENT EDUCATION Description: See Patient Education module for education specifics. Outcome: Progressing   Problem: RH BLADDER ELIMINATION Goal: RH STG MANAGE BLADDER WITH ASSISTANCE Description: STG Manage Bladder With min Assistance Outcome: Progressing Flowsheets (Taken 09/23/2018 1421) STG: Pt will manage bladder with assistance: 3-Moderate assistance   Problem: RH SKIN INTEGRITY Goal: RH STG SKIN FREE OF INFECTION/BREAKDOWN Description: Min assist Outcome: Progressing Goal: RH STG MAINTAIN SKIN INTEGRITY WITH ASSISTANCE Description: STG Maintain Skin Integrity With min Assistance. Outcome: Progressing Flowsheets (Taken 09/23/2018 1421) STG: Maintain skin integrity with assistance: 3-Moderate assistance   Problem: RH SAFETY Goal: RH STG ADHERE TO SAFETY PRECAUTIONS W/ASSISTANCE/DEVICE Description: STG Adhere to Safety Precautions With cues/reminders Assistance/Device. Outcome: Progressing Flowsheets (Taken 09/23/2018 1421) STG:Pt will adhere to safety precautions with assistance/device: 3-Moderate assistance   Problem: RH PAIN MANAGEMENT Goal: RH STG PAIN MANAGED AT OR BELOW PT'S PAIN GOAL Description: At or below level 5 Outcome: Progressing   Problem: RH KNOWLEDGE DEFICIT GENERAL Goal: RH STG INCREASE KNOWLEDGE OF SELF CARE AFTER HOSPITALIZATION Description: Dtr will be able to manage care at discharge using handouts and educational materials independently Outcome: Progressing

## 2018-09-23 NOTE — Progress Notes (Signed)
LE venous duplex       has been completed. Preliminary results can be found under CV proc through chart review. Jamarkus Lisbon, BS, RDMS, RVT   

## 2018-09-24 ENCOUNTER — Inpatient Hospital Stay (HOSPITAL_COMMUNITY): Payer: Self-pay

## 2018-09-24 ENCOUNTER — Inpatient Hospital Stay (HOSPITAL_COMMUNITY): Payer: Self-pay | Admitting: Speech Pathology

## 2018-09-24 LAB — GLUCOSE, CAPILLARY
Glucose-Capillary: 79 mg/dL (ref 70–99)
Glucose-Capillary: 178 mg/dL — ABNORMAL HIGH (ref 70–99)
Glucose-Capillary: 176 mg/dL — ABNORMAL HIGH (ref 70–99)
Glucose-Capillary: 128 mg/dL — ABNORMAL HIGH (ref 70–99)

## 2018-09-24 NOTE — Progress Notes (Signed)
Speech Language Pathology Daily Session Note  Patient Details  Name: Lisa Mcdowell MRN: [id number] Date of Birth: 08/08/1950  Today's Date: 09/24/2018 SLP Individual Time: [id number] SLP Individual Time Calculation (min): 52 min  Short Term Goals: Week 1: SLP Short Term Goal 1 (Week 1): Pt will consume dysphagia 2, thin liquid diet with minimal overt s/s aspiration and Min cues for use of swallow strategies.  Skilled Therapeutic Interventions: Pt was seen for skilled ST targeting dysphagia. Spanish interpretor was present throughout session. Once dentures donned, SLP facilitated first half of session with skilled observation of pt consuming upgraded trials of dysphagia 3 (mech soft) solids. Supervision cues provided for use of slow rate swallow strategy. Although slightly prolonged, effective mastication and oral clearance of POs. Pt and daughter both report volume of PO intake has greatly improved and pt stated she has fewer concerns about her swallowing; appears to have timely swallow initiation and less oral holding in comparison to previous acute care notes. SLP also facilitated session with skilled observation of pt consuming dysphagia 2 (minced) lunch tray and thin liquids. Min A provided for use of slow rate and alternating solids with liquids to mitigate globus sensation, although pt denied any globus today. Recommend continue current dysphagia 2 diet with thin liquids and will follow up with at additional dysphagia 3 trials to assess readiness for solid advancement.      Pain Pain Assessment Pain Scale: 0-10 Pain Score: 0-No pain  Therapy/Group: Individual Therapy  Arbutus Leas 09/24/2018, 2:46 PM

## 2018-09-24 NOTE — Patient Care Conference (Signed)
Inpatient RehabilitationTeam Conference and Plan of Care Update Date: 09/24/2018   Time: 10:20 AM    Patient Name: Lisa Mcdowell      Medical Record Number: [id number]  Date of Birth: 06/17/1950 Sex: Female         Room/Bed: 4M06C/4M06C-01 Payor Info: Payor: MEDICAID POTENTIAL / Plan: MEDICAID POTENTIAL / Product Type: *No Product type* /    Admitting Diagnosis: 4. 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 Dermatomyositis ([place]) 09/22/2018  . Pressure injury of skin 09/17/2018  . Thrombocytopenia ([place]) 09/06/2018  . Transaminitis 08/26/2018  . Elevated liver enzymes   . Dysphagia 08/25/2018  . 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 Encounter for screening colonoscopy 03/24/2015  . Essential tremor 02/06/2015  . Hyperlipidemia 10/28/2014  . GERD (gastroesophageal reflux disease) 10/28/2014  . Hemorrhoids 10/28/2014  . Gallstones 08/08/2010    Expected Discharge Date: Expected Discharge Date: 10/09/18(after therapies)  Team Members Present: Physician leading conference: Dr. Alysia Penna Social Worker Present: Ovidio Kin, LCSW Nurse Present: Rayetta Humphrey, RN PT Present: Apolinar Junes, PT OT Present: Roanna Epley, [place], OT SLP Present: Other (comment)(Erin Smith-SP) PPS Coordinator present : Gunnar Fusi, SLP     Current Status/Progress Goal Weekly Team Focus  Medical   Pt is on very slow wean of steroids for dermatomyositis- BGs are elevated; doesn't have leukocytosis, however  to wean steroids/so can decrease BGs  slowly wean steroids   Bowel/Bladder   LBM 9/8 w/ miralax, pt continent of B/B, pt's urine has foul smell  maintain continence thorughout stay  assess  toileting q shift and prn   Swallow/Nutrition/ Hydration   Min A  Mod I  reinforce swallow strategies, upgraded trials D3   ADL's   min - mod A of 3 for transfers, total A LB self care, mod A UB self care, total A toileting, mod A bathing at shower level  min A overall  NMR, self care retraining, functional transfers/mobility, pt/caregiver education   Mobility   mod-min A +2 for transfers, mod A bed mobility, min A gait 66' with RW  Min A overall, including 1 STE without rail, CGA gait 150'  NMR, balance, strengthening, functional mobility, patient/caregiver education.   Communication             Safety/Cognition/ Behavioral Observations            Pain   pt no c/o during shift  address new onset pain, maintain pain less than 3  assess pain q shift and prn   Skin   ecchymosis on abdomen/arm/leg, scabbed incision on right anterior thigh, stage 2 on sacrum  address current skin issues and prevent further breakdown  assess skin q shift and prn      *See Care Plan and progress notes for long and short-term goals.     Barriers to Discharge  Current Status/Progress Possible Resolutions Date Resolved   Physician    Inaccessible home environment;Decreased caregiver support;Medical stability;Incontinence;Lack of/limited family support;Other (comments)  spanish speaking; on high doses of steroids- 100 mg currently  mod-max assist of 2- for ADLs and mobility  might benefit from estim      Nursing  PT  Home environment access/layout  1 STE without rails              OT Other (comments)  none known at this time             SLP                [facility] for SNF coverage No eligible for Medicaid            Discharge Planning/Teaching Needs:  Home with family-children and husband providing 24 hr care for a short time. Hopeful she will reach level she can be alone for some of the day. Daughter was here yesterday to observe in therapies      Team Discussion:  Goals  CGA-min assist level. Currently min-mod level. Very deconditioned and has anxiety in therapies. Hard worker but likes when daughter is here. MD watching BS and checking UA. Question need for PICC line. Swallow upgraded to Dys 2. Making good progress in therapies.  Revisions to Treatment Plan:  DC 9/24 after therapies    Continued Need for Acute Rehabilitation Level of Care: The patient requires daily medical management by a physician with specialized training in physical medicine and rehabilitation for the following conditions: Daily direction of a multidisciplinary physical rehabilitation program to ensure safe treatment while eliciting the highest outcome that is of practical value to the patient.: Yes Daily medical management of patient stability for increased activity during participation in an intensive rehabilitation regime.: Yes Daily analysis of laboratory values and/or radiology reports with any subsequent need for medication adjustment of medical intervention for : Neurological problems;Diabetes problems;Urological problems;Other   I attest that I was present, lead the team conference, and concur with the assessment and plan of the team. Teleconference held due to COVID 19   Leticia Mcdiarmid, Gardiner Rhyme 09/25/2018, 3:22 PM

## 2018-09-24 NOTE — Progress Notes (Signed)
Occupational Therapy Session Note  Patient Details  Name: Lisa Mcdowell MRN: [id number] Date of Birth: 03-07-1950  Today's Date: 09/24/2018 OT Individual Time: 0900-1000 OT Individual Time Calculation (min): 60 min    Short Term Goals: Week 1:  OT Short Term Goal 1 (Week 1): Pt will perform toileting with assist of 1 person. OT Short Term Goal 2 (Week 1): Pt will perform LB dressing with mod A overall. OT Short Term Goal 3 (Week 1): Pt will perform bathing with mod A overall.  Skilled Therapeutic Interventions/Progress Updates:    Pt resting in recliner upon arrival with interpreter present.  Pt did not have any clean clothing-daughter brought some in at end of session.  Requested daughter leave extra clothing.  OT intervention with focus on BUE therex and functional tasks to increase AROM and strength.  Pt is very motivated and appreciative of therapy.  Pt with WFL PROM at shoulder but with limited AROM (approx 80 degrees). Pt performed therex BUE tasks (diagonals and chest stretches) with level 1 3X8; Pt also performed chest presses with beach ball (3X8), trunk rotation while holding beach ball, and biceps curls with 1# weight 3X8. Pt engaged in reaching for objects with focus on reaching outside BOS while seated. Pt remained in recliner with daughter present and belt alarm activated.   Therapy Documentation Precautions:  Precautions Precautions: Fall Restrictions Weight Bearing Restrictions: No   Pain:  Pt denies pain this mornng   Therapy/Group: Individual Therapy  Leroy Libman 09/24/2018, 12:11 PM

## 2018-09-24 NOTE — Progress Notes (Signed)
Physical Therapy Session Note  Patient Details  Name: Lisa Mcdowell MRN: [id number] Date of Birth: 23-Jul-1950  Today's Date: 09/24/2018 PT Individual Time: 1300-1415 PT Individual Time Calculation (min): 75 min   Short Term Goals: Week 1:  PT Short Term Goal 1 (Week 1): Patient will perform basic transfers with mod A of 1 person. PT Short Term Goal 2 (Week 1): Patient will ambulate 2' with CGA using LRAD. PT Short Term Goal 3 (Week 1): Patient will initiate stair training. PT Short Term Goal 4 (Week 1): Patient will perform dynamic standing balance with CGA.  Skilled Therapeutic Interventions/Progress Updates:     Patient in recliner in room with interpreter and her daughter present upon PT arrival. Patient alert and agreeable to PT session. Interpreter translated throughout session and patient's daughter participated in mobility education throughout session. Patient denied any pain today and reported decreased tremors.   Therapeutic Activity: Bed Mobility: Patient performed rolling R/L and supine to/from sit with min A. Provided verbal cues for sliding LEs off and pushing through elbows to sit up. Transfers: Patient performed sit to/from stand x4 and stand pivot x2 with mod A of 2 using the RW. She performed sit to/from stand with the mat table at a slightly elevated height with mod A of 1. Provided verbal cues for hand placement on RW, leaning forward to stand, reaching back to sit, and bringing her feet underneath her prior to standing.  Gait Training:  Patient ambulated 10 feet, 120 feet, and 220 feet using RW with min A-CGA for balance. Ambulated with significantly decreased gait speed, decreased step length and height, increased B hip and knee flexion in stance, forward trunk lean, and downward head gaze. Provided verbal cues for erect posture, looking ahead, and increased step height. Noted R LE shaking due to fatigue when ambulating longer distances.  Wheelchair Mobility:   Patient propelled wheelchair 120 feet with min A for steering using B UEs. Attempted for patient to use B LEs to assist UEs, however she had difficulty following cues and LEs fatigued quickly with this technique. Provided verbal cues for steering and turning technique and increased UE ROM during propulsion. Provided patient with a taller 16"x16" w/c during session for easier transfers and w/c propulsion.  Patient in recliner with her daughter in the room at end of session with breaks locked, seat belt alarm set, and all needs within reach. Patient's HR elevated from 100's to 120-130's with transfer and gait activities. Provided several seated rest breaks during session for recovery, RN made aware.    Therapy Documentation Precautions:  Precautions Precautions: Fall Restrictions Weight Bearing Restrictions: No    Therapy/Group: Individual Therapy  Yang Rack L Dareen Gutzwiller PT, DPT  09/24/2018, 3:16 PM

## 2018-09-24 NOTE — Progress Notes (Signed)
Social Work Patient ID: Lisa Mcdowell, female   DOB: April 27, 1950, 68 y.o.   MRN: [id number]  Met with pt and daughter to inform team conference goals min-CGA level and target discharge 9/25. Daughter reports pt has an appointment at [facility] on 9/25 @ 8:00 am and was wondering if could change discharge to 9/24 after therapies, will ask team and MD. Daughter here observing and assisting in therapies.

## 2018-09-24 NOTE — Progress Notes (Signed)
PHYSICAL MEDICINE & REHABILITATION PROGRESS NOTE   Subjective/Complaints: Pt reports She's not having any pain.  Feels OK- denies any other issues- sitting in chair at bedside in her room.   Objective:   Vas [place] Lower Extremity Venous (dvt)  Result Date: 09/23/2018  Lower Venous Study Indications: Edema.  Comparison Study: no prior Performing Technologist: June Leap RDMS, RVT  Examination Guidelines: A complete evaluation includes B-mode imaging, spectral Doppler, color Doppler, and power Doppler as needed of all accessible portions of each vessel. Bilateral testing is considered an integral part of a complete examination. Limited examinations for reoccurring indications may be performed as noted.  +---------+---------------+---------+-----------+----------+--------------+ RIGHT    CompressibilityPhasicitySpontaneityPropertiesThrombus Aging +---------+---------------+---------+-----------+----------+--------------+ CFV      Full           Yes      Yes                                 +---------+---------------+---------+-----------+----------+--------------+ SFJ      Full                                                        +---------+---------------+---------+-----------+----------+--------------+ FV Prox  Full                                                        +---------+---------------+---------+-----------+----------+--------------+ FV Mid   Full                                                        +---------+---------------+---------+-----------+----------+--------------+ FV DistalFull                                                        +---------+---------------+---------+-----------+----------+--------------+ PFV      Full                                                        +---------+---------------+---------+-----------+----------+--------------+ POP      Full           Yes      Yes                                  +---------+---------------+---------+-----------+----------+--------------+ PTV      Full                                                        +---------+---------------+---------+-----------+----------+--------------+  PERO     Full                                                        +---------+---------------+---------+-----------+----------+--------------+   +---------+---------------+---------+-----------+----------+--------------+ LEFT     CompressibilityPhasicitySpontaneityPropertiesThrombus Aging +---------+---------------+---------+-----------+----------+--------------+ CFV      Full           Yes      Yes                                 +---------+---------------+---------+-----------+----------+--------------+ SFJ      Full                                                        +---------+---------------+---------+-----------+----------+--------------+ FV Prox  Full                                                        +---------+---------------+---------+-----------+----------+--------------+ FV Mid   Full                                                        +---------+---------------+---------+-----------+----------+--------------+ FV DistalFull                                                        +---------+---------------+---------+-----------+----------+--------------+ PFV      Full                                                        +---------+---------------+---------+-----------+----------+--------------+ POP      Full           Yes      Yes                                 +---------+---------------+---------+-----------+----------+--------------+ PTV      Full                                                        +---------+---------------+---------+-----------+----------+--------------+ PERO     Full                                                         +---------+---------------+---------+-----------+----------+--------------+  Summary: Right: There is no evidence of deep vein thrombosis in the lower extremity. No cystic structure found in the popliteal fossa. Left: There is no evidence of deep vein thrombosis in the lower extremity. No cystic structure found in the popliteal fossa.  *See table(s) above for measurements and observations.    Preliminary    Recent Labs    09/22/18 1433 09/23/18 0508  WBC 7.1 5.0  HGB 10.7* 9.8*  HCT 33.2* 30.7*  PLT 180 153   Recent Labs    09/22/18 1433 09/23/18 0508  NA  --  139  K  --  3.8  CL  --  105  CO2  --  27  GLUCOSE  --  83  BUN  --  20  CREATININE 0.49 0.45  CALCIUM  --  8.1*    Intake/Output Summary (Last 24 hours) at 09/24/2018 1249 Last data filed at 09/24/2018 0747 Gross per 24 hour  Intake 480 ml  Output 500 ml  Net -20 ml     Physical Exam: Vital Signs Blood pressure 124/68, pulse 91, temperature 99.5 F (37.5 C), temperature source Oral, resp. rate 18, weight 67.5 kg, SpO2 95 %. Physical Exam  Vitals and labs reviewed. Constitutional: awake, alert, appropriate, spanish speaking, in chair next to bedside, appears comfortable, NAD HENT:  Head: Normocephalic and atraumatic.  Eyes: EOM are normal.  Neck: No tracheal deviation present. No thyromegaly present.  CV- borderline tachycardia again today, regular rhythm Respiratory: CTA B/L GI: She exhibits no distension.  Musculoskeletal:     Comments: No edema or tenderness in extremities  Neurological: She is alert.  Patient is alert sitting up in chair.   Makes good eye contact with examiner.   She can repeat some simple words.   Has some difficulty following commands, even with translation Motor: Appears to be bilateral shoulder adduction 2+/5, elbow flexion/extension 4-/5, handgrip 4/5 Bilateral hip flexion 2/5, knee extension 2+/5, ankle dorsiflexion 4+/5 Sensation diminished to light touch bilateral feet  Skin:  Skin is warm and dry.  Psychiatric:  Flat affect     Assessment/Plan: 1. 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  Physiatrist is providing close team supervision and 24 hour management of active medical problems listed below.  Physiatrist and rehab team continue to assess barriers to discharge/monitor patient progress toward functional and medical goals  Care Tool:  Bathing    Body parts bathed by patient: Right arm, Left arm, Chest, Abdomen, Right upper leg, Left upper leg   Body parts bathed by helper: Buttocks, Right lower leg, Left lower leg, Front perineal area     Bathing assist Assist Level: Maximal Assistance - Patient 24 - 49%     Upper Body Dressing/Undressing Upper body dressing   What is the patient wearing?: Pull over shirt    Upper body assist Assist Level: Moderate Assistance - Patient 50 - 74%    Lower Body Dressing/Undressing Lower body dressing      What is the patient wearing?: Underwear/pull up     Lower body assist Assist for lower body dressing: Total Assistance - Patient < 25%     Toileting Toileting    Toileting assist Assist for toileting: Maximal Assistance - Patient 25 - 49%     Transfers Chair/bed transfer  Transfers assist     Chair/bed transfer assist level: Moderate Assistance - Patient 50 - 74%     Locomotion Ambulation  Ambulation assist      Assist level: Minimal Assistance - Patient > 75% Assistive device: Walker-rolling Max distance: 60'   Walk 10 feet activity   Assist     Assist level: Minimal Assistance - Patient > 75% Assistive device: Walker-rolling   Walk 50 feet activity   Assist    Assist level: Minimal Assistance - Patient > 75% Assistive device: Walker-rolling    Walk 150 feet activity   Assist Walk 150 feet activity did not occur: Safety/medical concerns(decreased  strength/activity tolerance)         Walk 10 feet on uneven surface  activity   Assist Walk 10 feet on uneven surfaces activity did not occur: Safety/medical concerns(decreased strength/activity tolerance)         Wheelchair     Assist   Type of Wheelchair: Manual    Wheelchair assist level: Set up assist, Supervision/Verbal cueing Max wheelchair distance: 150'    Wheelchair 50 feet with 2 turns activity    Assist        Assist Level: Set up assist, Supervision/Verbal cueing   Wheelchair 150 feet activity     Assist   CBG (last 3)  Recent Labs    09/23/18 2120 09/24/18 0619 09/24/18 1148  GLUCAP 105* 79 178*        Assist Level: Set up assist, Supervision/Verbal cueing   Blood pressure 124/68, pulse 91, temperature 99.5 F (37.5 C), temperature source Oral, resp. rate 18, weight 67.5 kg, SpO2 95 %.  Medical Problem List and Plan: 1.  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  Current plan is prolonged course of Solu-Medrol 6 weeks followed by taper and follow-up rheumatology services Dr [name] at [place] to CIR 2.  Antithrombotics: -DVT/anticoagulation: Subcutaneous Lovenox.               Check vascular study             -antiplatelet therapy: N/A 3. Pain Management: Tylenol as needed 4. Mood: Provide emotional support             -antipsychotic agents: N/A 5. Neuropsych: This patient is?  Fully capable of making decisions on her own behalf. 6. Skin/Wound Care: Routine skin checks 7. Fluids/Electrolytes/Nutrition: Routine I/Os.  CMP ordered for tomorrow a.m. ; of note, no leukocytosis.  9/8- Ca low at 8.1; otherwise well BMP; ALT/AST somewhat elevated- will monitor 8.  Dysphagia.  Dysphagia #1 thin liquids.  Follow-up speech therapy.  Advance diet as tolerated 9.  Steroid-induced hyperglycemia: Sliding scale insulin.  Monitor with taper  9/8- BGs 94-160s-  will con't to monitor/SSI   10. Dispo  9/8- Needs translator for [place]  9/9- team conference today - likely needs 14-17 days  LOS: 2 days A FACE TO FACE EVALUATION WAS PERFORMED  Nijae Doyel 09/24/2018, 12:49 PM

## 2018-09-25 ENCOUNTER — Inpatient Hospital Stay (HOSPITAL_COMMUNITY): Payer: Self-pay | Admitting: Occupational Therapy

## 2018-09-25 ENCOUNTER — Inpatient Hospital Stay (HOSPITAL_COMMUNITY): Payer: Self-pay | Admitting: Speech Pathology

## 2018-09-25 ENCOUNTER — Inpatient Hospital Stay (HOSPITAL_COMMUNITY): Payer: Self-pay

## 2018-09-25 LAB — GLUCOSE, CAPILLARY
Glucose-Capillary: 76 mg/dL (ref 70–99)
Glucose-Capillary: 194 mg/dL — ABNORMAL HIGH (ref 70–99)
Glucose-Capillary: 230 mg/dL — ABNORMAL HIGH (ref 70–99)
Glucose-Capillary: 121 mg/dL — ABNORMAL HIGH (ref 70–99)

## 2018-09-25 MED ORDER — POLYETHYLENE GLYCOL 3350 17 G PO PACK
17.0000 g | PACK | Freq: Every day | ORAL | Status: DC
  Administered 2018-09-25 – 2018-10-06 (×10): 17 g via ORAL
  Filled 2018-09-25 (×10): qty 1

## 2018-09-25 MED ORDER — BISACODYL 10 MG RE SUPP: 10.0000 mg | Freq: Every day | RECTAL | Status: DC | PRN

## 2018-09-25 NOTE — Progress Notes (Addendum)
Physical Therapy Session Note  Patient Details  Name: Lisa Mcdowell MRN: [id number] Date of Birth: [birth year]  Today's Date: 09/25/2018 PT Individual Time:1300  - 1415, 75 min     Short Term Goals: Week 1:  PT Short Term Goal 1 (Week 1): Patient will perform basic transfers with mod A of 1 person. PT Short Term Goal 2 (Week 1): Patient will ambulate 54' with CGA using LRAD. PT Short Term Goal 3 (Week 1): Patient will initiate stair training. PT Short Term Goal 4 (Week 1): Patient will perform dynamic standing balance with CGA.  Skilled Therapeutic Interventions/Progress Updates:   Pt seated in recliner with medical interpreter and dtr present.  PT recommended that pt sit on a higher surface at home, using a firm cushion or pillow to raise seat.  Pt and dtr verbalized understanding.   Sit> stand from recliner with max assist.  Stand pivot with RW to w/c with min assist.  neuromuscular re-education via forced use, postioning for gluteal muscle activation, using KInetron at level 50  From w/c level, in trunk fleioxn, x 30 cycles x 2.  PT issued hand-out and  instructed pt and dtr in self stretching bil hamstrings and heel cords, in sitting, using step stool, and strap x 30 seconds x 3.   Further stretching and balance challenge, standing x 2 minutes with forefeet on wedge, bil UE support on Rw.  PT instructed pt in diaphragmatic breathing with fair carry over.  Sit> stand with max assist from w/c.  Gait training with RW, min assist x 200' .  Cues for upright trunk and forward gaze.  Use of Stedy to transfer to recliner.  W/c cushion placed in recliner to raise seat.  At end of session, bil LEs elevated, pillows supporteding bil UEs, seat pad alarm set ad dtr present.       Therapy Documentation Precautions:  Precautions Precautions: Fall Restrictions Weight Bearing Restrictions: No  Pain: Pain Assessment Pain Scale: 0-10 Pain Score: 0-No pain      Therapy/Group:  Individual Therapy  Finley Dinkel 09/25/2018, 12:28 PM

## 2018-09-25 NOTE — Progress Notes (Signed)
[place] PHYSICAL MEDICINE & REHABILITATION PROGRESS NOTE   Subjective/Complaints: Pt reports she has a mild HA and would like some tylenol for it.  Also, hasn't had a BM in 2 days and required (it sounds like via interpretor) that required Miralax to get her to have BM at that time.  Will schedule miralax daily and also add Suppository prn if hasn't had BM in 2 days.   Objective:   Vas [place] Lower Extremity Venous (dvt)  Result Date: 09/24/2018  Lower Venous Study Indications: Edema.  Comparison Study: no prior Performing Technologist: June Leap RDMS, RVT  Examination Guidelines: A complete evaluation includes B-mode imaging, spectral Doppler, color Doppler, and power Doppler as needed of all accessible portions of each vessel. Bilateral testing is considered an integral part of a complete examination. Limited examinations for reoccurring indications may be performed as noted.  +---------+---------------+---------+-----------+----------+--------------+ RIGHT    CompressibilityPhasicitySpontaneityPropertiesThrombus Aging +---------+---------------+---------+-----------+----------+--------------+ CFV      Full           Yes      Yes                                 +---------+---------------+---------+-----------+----------+--------------+ SFJ      Full                                                        +---------+---------------+---------+-----------+----------+--------------+ FV Prox  Full                                                        +---------+---------------+---------+-----------+----------+--------------+ FV Mid   Full                                                        +---------+---------------+---------+-----------+----------+--------------+ FV DistalFull                                                        +---------+---------------+---------+-----------+----------+--------------+ PFV      Full                                                         +---------+---------------+---------+-----------+----------+--------------+ POP      Full           Yes      Yes                                 +---------+---------------+---------+-----------+----------+--------------+ PTV      Full                                                        +---------+---------------+---------+-----------+----------+--------------+  PERO     Full                                                        +---------+---------------+---------+-----------+----------+--------------+   +---------+---------------+---------+-----------+----------+--------------+ LEFT     CompressibilityPhasicitySpontaneityPropertiesThrombus Aging +---------+---------------+---------+-----------+----------+--------------+ CFV      Full           Yes      Yes                                 +---------+---------------+---------+-----------+----------+--------------+ SFJ      Full                                                        +---------+---------------+---------+-----------+----------+--------------+ FV Prox  Full                                                        +---------+---------------+---------+-----------+----------+--------------+ FV Mid   Full                                                        +---------+---------------+---------+-----------+----------+--------------+ FV DistalFull                                                        +---------+---------------+---------+-----------+----------+--------------+ PFV      Full                                                        +---------+---------------+---------+-----------+----------+--------------+ POP      Full           Yes      Yes                                 +---------+---------------+---------+-----------+----------+--------------+ PTV      Full                                                         +---------+---------------+---------+-----------+----------+--------------+ PERO     Full                                                        +---------+---------------+---------+-----------+----------+--------------+  Summary: Right: There is no evidence of deep vein thrombosis in the lower extremity. No cystic structure found in the popliteal fossa. Left: There is no evidence of deep vein thrombosis in the lower extremity. No cystic structure found in the popliteal fossa.  *See table(s) above for measurements and observations. Electronically signed by Curt Jews MD on 09/24/2018 at 4:54:23 PM.    Final    Recent Labs    09/22/18 1433 09/23/18 0508  WBC 7.1 5.0  HGB 10.7* 9.8*  HCT 33.2* 30.7*  PLT 180 153   Recent Labs    09/22/18 1433 09/23/18 0508  NA  --  139  K  --  3.8  CL  --  105  CO2  --  27  GLUCOSE  --  83  BUN  --  20  CREATININE 0.49 0.45  CALCIUM  --  8.1*    Intake/Output Summary (Last 24 hours) at 09/25/2018 1255 Last data filed at 09/24/2018 1830 Gross per 24 hour  Intake 480 ml  Output -  Net 480 ml     Physical Exam: Vital Signs Blood pressure (!) 118/51, pulse 86, temperature 98.6 F (37 C), temperature source Oral, resp. rate 17, weight 67.4 kg, SpO2 99 %. Physical Exam  Vitals and labs reviewed. Constitutional: awake, alert, appropriate, spanish speaking, in chair next to bedside, appears comfortable again today; used tele-interpretor to communicate with pt- worked well, NAD HENT:  Head: Normocephalic and atraumatic.  Eyes: EOM are normal.  Neck: No tracheal deviation present. No thyromegaly present.  CV- borderline tachycardia again today, regular rhythm Respiratory: CTA B/L GI: She exhibits no distension.  Musculoskeletal:     Comments: No edema or tenderness in extremities  Neurological: She is alert.  Patient is alert sitting up in chair.   Makes good eye contact with examiner.   She can repeat some simple words.   Has some  difficulty following commands, even with translation Motor: Appears to be bilateral shoulder adduction 2+/5, elbow flexion/extension 4-/5, handgrip 4/5 Bilateral hip flexion 2/5, knee extension 2+/5, ankle dorsiflexion 4+/5- still very weak on exam Sensation diminished to light touch bilateral feet  Skin: Skin is warm and dry.  Psychiatric:  Brighter affect     Assessment/Plan: 1. 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  Physiatrist is providing close team supervision and 24 hour management of active medical problems listed below.  Physiatrist and rehab team continue to assess barriers to discharge/monitor patient progress toward functional and medical goals  Care Tool:  Bathing    Body parts bathed by patient: Right arm, Left arm, Chest, Abdomen, Right upper leg, Left upper leg, Front perineal area, Face   Body parts bathed by helper: Buttocks, Right lower leg, Left lower leg     Bathing assist Assist Level: Moderate Assistance - Patient 50 - 74%     Upper Body Dressing/Undressing Upper body dressing   What is the patient wearing?: Pull over shirt    Upper body assist Assist Level: Moderate Assistance - Patient 50 - 74%    Lower Body Dressing/Undressing Lower body dressing      What is the patient wearing?: Pants, Underwear/pull up     Lower body assist Assist for lower body dressing: Total Assistance - Patient < 25%     Toileting Toileting    Toileting assist Assist for toileting: Minimal Assistance - Patient > 75%     Transfers  Chair/bed transfer  Transfers assist     Chair/bed transfer assist level: Moderate Assistance - Patient 50 - 74%     Locomotion Ambulation   Ambulation assist      Assist level: Minimal Assistance - Patient > 75% Assistive device: Walker-rolling Max distance: 60'   Walk 10 feet activity   Assist      Assist level: Minimal Assistance - Patient > 75% Assistive device: Walker-rolling   Walk 50 feet activity   Assist    Assist level: Minimal Assistance - Patient > 75% Assistive device: Walker-rolling    Walk 150 feet activity   Assist Walk 150 feet activity did not occur: Safety/medical concerns(decreased strength/activity tolerance)         Walk 10 feet on uneven surface  activity   Assist Walk 10 feet on uneven surfaces activity did not occur: Safety/medical concerns(decreased strength/activity tolerance)         Wheelchair     Assist   Type of Wheelchair: Manual    Wheelchair assist level: Set up assist, Supervision/Verbal cueing Max wheelchair distance: 150'    Wheelchair 50 feet with 2 turns activity    Assist        Assist Level: Set up assist, Supervision/Verbal cueing   Wheelchair 150 feet activity     Assist   CBG (last 3)  Recent Labs    09/24/18 2105 09/25/18 0610 09/25/18 1214  GLUCAP 128* 76 194*        Assist Level: Set up assist, Supervision/Verbal cueing   Blood pressure (!) 118/51, pulse 86, temperature 98.6 F (37 C), temperature source Oral, resp. rate 17, weight 67.4 kg, SpO2 99 %.  Medical Problem List and Plan: 1.  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  Current plan is prolonged course of Solu-Medrol 6 weeks followed by taper and follow-up rheumatology services Dr [name] at [facility].  9/10- has appt At [facility] on 9/25- will d/c 9/24 after therapies Has telehealth interpretor in her room.             Admit to CIR 2.  Antithrombotics: -DVT/anticoagulation: Subcutaneous Lovenox.               Check vascular study             -antiplatelet therapy: N/A 3. Pain Management: Tylenol as needed  9/10- c/o mild HA- asked for tylenol 4. Mood: Provide emotional support             -antipsychotic agents: N/A 5. Neuropsych: This patient is?  Fully  capable of making decisions on her own behalf. 6. Skin/Wound Care: Routine skin checks 7. Fluids/Electrolytes/Nutrition: Routine I/Os.  CMP ordered for tomorrow a.m. ; of note, no leukocytosis.  9/8- Ca low at 8.1; otherwise well BMP; ALT/AST somewhat elevated- will monitor 8.  Dysphagia.  Dysphagia #1 thin liquids.  Follow-up speech therapy.  Advance diet as tolerated 9.  Steroid-induced hyperglycemia: Sliding scale insulin.  Monitor with taper  9/8- BGs 94-160s- will con't to monitor/SSI 10. Constipation  9/10- LBM 2 days ago with miralax- ordered Miralax daily for pt and a suppository prn if no BM in 2 days.  11. Dispo  9/8- Needs translator for Spanish  9/9- team conference today - likely needs 14-17 days  9/10- d/c date set for 10/09/2018  LOS: 3 days A FACE TO FACE EVALUATION WAS PERFORMED  Briton Sellman 09/25/2018, 12:55 PM

## 2018-09-25 NOTE — Progress Notes (Signed)
Speech Language Pathology Daily Session Note  Patient Details  Name: Lisa Mcdowell MRN: [id number] Date of Birth: [birth year]  Today's Date: 09/25/2018 SLP Individual Time: [id number] SLP Individual Time Calculation (min): 52 min  Short Term Goals: Week 1: SLP Short Term Goal 1 (Week 1): Pt will consume dysphagia 2, thin liquid diet with minimal overt s/s aspiration and Min cues for use of swallow strategies.  Skilled Therapeutic Interventions: Pt was seen for skilled ST services focused on dysphagia. Spanish interpretor present throughout session. Once pt donned dentures, SLP facilitated session with skilled observation of upgraded dysphagia 3 (mech soft) trials. Pt verbalized previously discussed swallow precautions with Supervision A question cues. She also demonstrated use of said strategies with Supervision A throughout trials. Pt and daughter reported no difficulty consuming current dysphagia 2 (minced) meals and expressed desire to continue with progression of solids as appropriate. Based on current presentation with upgraded trials and near goal level use of swallow precautions, anticipate pt will be ready to advance solid diet soon. ST plans to trial a full dysphagia 3 meal with pt in next targeted session. Pt was left in chair with seatbelt alarm set, all needs within reach, and daughter still present. Continue per current plan of care.       Pain Pain Assessment Pain Scale: 0-10 Pain Score: 0-No pain  Therapy/Group: Individual Therapy  Arbutus Leas 09/25/2018, 12:36 PM

## 2018-09-25 NOTE — IPOC Note (Signed)
Overall Plan of Care [facility]) Patient Details Name: Lisa Mcdowell MRN: [id number] DOB: 07/28/1950  Admitting Diagnosis: Dermatomyositis [facility])  Hospital Problems: Principal Problem:   Dermatomyositis ([place]) Active Problems:   Secondary rhabdomyolysis   Dysphagia   Transaminitis     Functional Problem List: Nursing Bladder, Pain, Safety  PT Balance, Edema, Endurance, Motor, Nutrition, Pain, Perception, Safety, Sensory, Skin Integrity  OT Balance, Endurance, Motor, Pain, Perception, Safety, Sensory  SLP Nutrition  TR         Basic ADL's: OT Grooming, Bathing, Dressing, Toileting, Eating     Advanced  ADL's: OT       Transfers: PT Bed Mobility, Bed to Chair, Car, Furniture, Floor  OT Toilet, Metallurgist: PT Ambulation, Emergency planning/management officer, Stairs     Additional Impairments: OT    SLP Swallowing      TR      Anticipated Outcomes Item Anticipated Outcome  Self Feeding supervision  Swallowing  Mod I   Basic self-care  S - min A  Toileting  min A   Bathroom Transfers min A  Bowel/Bladder  manage bladder with min assist/timed toileting  Transfers  min A  Locomotion  CGA 150' with LRAD  Communication     Cognition     Pain  pain at or below level 5  Safety/Judgment  maintain safety with cues/reminders   Therapy Plan: PT Intensity: Minimum of 1-2 x/day ,45 to 90 minutes PT Frequency: 5 out of 7 days PT Duration Estimated Length of Stay: 2 weeks OT Intensity: Minimum of 1-2 x/day, 45 to 90 minutes OT Frequency: 5 out of 7 days OT Duration/Estimated Length of Stay: 14- 17 days SLP Intensity: Minumum of 1-2 x/day, 30 to 90 minutes SLP Frequency: 3 to 5 out of 7 days SLP Duration/Estimated Length of Stay: 10-14 days   Due to the current state of emergency, patients may not be receiving their 3-hours of Medicare-mandated therapy.   Team Interventions: Nursing Interventions Patient/Family Education, Bladder Management, Medication  Management, Disease Management/Prevention, Pain Management, Discharge Planning  PT interventions Ambulation/gait training, Discharge planning, Functional mobility training, Psychosocial support, Therapeutic Activities, Balance/vestibular training, Disease management/prevention, Neuromuscular re-education, Skin care/wound management, Therapeutic Exercise, Wheelchair propulsion/positioning, DME/adaptive equipment instruction, Pain management, Splinting/orthotics, UE/LE Strength taining/ROM, Community reintegration, Technical sales engineer stimulation, Patient/family education, IT trainer, UE/LE Coordination activities  OT Interventions Training and development officer, Engineer, drilling, Patient/family education, Therapeutic Activities, Wheelchair propulsion/positioning, Functional electrical stimulation, Psychosocial support, Therapeutic Exercise, Community reintegration, Functional mobility training, UE/LE Strength taining/ROM, Discharge planning, Neuromuscular re-education, Self Care/advanced ADL retraining, UE/LE Coordination activities, Pain management  SLP Interventions Dysphagia/aspiration precaution training, Patient/family education  TR Interventions    SW/CM Interventions Discharge Planning, Psychosocial Support, Patient/Family Education   Barriers to Discharge MD  Medical stability, Lack of/limited family support, Medication compliance and high steroid doses and overall weakness  Nursing      PT Home environment access/layout 1 STE without rails  OT Other (comments) none known at this time  SLP      [place] for SNF coverage No eligible for [facility]   Team Discharge Planning: Destination: PT-Home ,OT-   , SLP-Home Projected Follow-up: PT-Home health PT, OT-  Home health OT, 24 hour supervision/assistance, SLP-Other (comment)(TBD) Projected Equipment Needs: PT-To be determined, OT- Tub/shower bench, SLP-None recommended by SLP Equipment Details: PT-Patient reports  that she has a RW, w/c, and SPC purchased from a neighbor, OT-  Patient/family involved in discharge planning: PT- Patient, Family member/caregiver,  OT-Patient, Family  member/caregiver, SLP-Patient, Family member/caregiver  MD ELOS: 2 weeks Medical Rehab Prognosis:  Good Assessment: Pt is a 68 yr old female with  Dermatomyositis requiring high level steroids (100 mg) that has associated hyperglycemia from steroids, as well as constipation, and Dysphagia- she's also had some transaminitis associated with dx-  Her goals are CGA to min assist and d/c date set from 9/24 after therapies.   See Team Conference Notes for weekly updates to the plan of care

## 2018-09-25 NOTE — Progress Notes (Signed)
Occupational Therapy Session Note  Patient Details  Name: Lisa Mcdowell MRN: [id number] Date of Birth: [birth year]  Today's Date: 09/25/2018 OT Individual Time: 0900-1000 OT Individual Time Calculation (min): 60 min    Short Term Goals: Week 1:  OT Short Term Goal 1 (Week 1): Pt will perform toileting with assist of 1 person. OT Short Term Goal 2 (Week 1): Pt will perform LB dressing with mod A overall. OT Short Term Goal 3 (Week 1): Pt will perform bathing with mod A overall.  Skilled Therapeutic Interventions/Progress Updates:    Upon entering the room, pt seated in recliner chair with interpreter present in the room. Pt requesting to shower this session. Pt standing from recliner chair into stedy with assistance of 1. OT assisted pt via stedy into shower and seated on TTB. Pt bathing at shower level with mod A this session. Her daughter is also present at this time and needs cuing this session to not assist pt with care. Pt verbalized need for toileting while in shower and transferred onto toilet with stedy. Pt needing assistance with clothing management and hygiene. Pt remained seated on stedy surface while at sink for grooming tasks with supervision overall. Pt standing from elevated stedy seat with min A. Pt returning to recliner chair and very fatigued. Call bell and all needed items within reach. Family remains in room.   Therapy Documentation Precautions:  Precautions Precautions: Fall Restrictions Weight Bearing Restrictions: No   Pain: Pain Assessment Pain Scale: 0-10 Pain Score: 0-No pain   Therapy/Group: Individual Therapy  Gypsy Decant 09/25/2018, 12:34 PM

## 2018-09-26 ENCOUNTER — Inpatient Hospital Stay (HOSPITAL_COMMUNITY): Payer: Self-pay | Admitting: Speech Pathology

## 2018-09-26 ENCOUNTER — Inpatient Hospital Stay (HOSPITAL_COMMUNITY): Payer: Self-pay

## 2018-09-26 LAB — GLUCOSE, CAPILLARY
Glucose-Capillary: 81 mg/dL (ref 70–99)
Glucose-Capillary: 196 mg/dL — ABNORMAL HIGH (ref 70–99)
Glucose-Capillary: 190 mg/dL — ABNORMAL HIGH (ref 70–99)
Glucose-Capillary: 121 mg/dL — ABNORMAL HIGH (ref 70–99)

## 2018-09-26 NOTE — Progress Notes (Signed)
[place] PHYSICAL MEDICINE & REHABILITATION PROGRESS NOTE   Subjective/Complaints: Pt reports had a good sized BM yesterday- Miralax is working for her.  Her main issue is that she feels more weak and more numb today than has in many days- she thinks she did a LOT in therapy yesterday and was wondering if it had anything to do with it.  PICC line came out- also has IV in LUE- peripheral that wants out.  Objective:   No results found. No results for input(s): WBC, HGB, HCT, PLT in the last 72 hours. No results for input(s): NA, K, CL, CO2, GLUCOSE, BUN, CREATININE, CALCIUM in the last 72 hours. No intake or output data in the 24 hours ending 09/26/18 0923   Physical Exam: Vital Signs Blood pressure 112/61, pulse 90, temperature 98 F (36.7 C), temperature source Oral, resp. rate 18, weight 67.4 kg, SpO2 98 %. Physical Exam  Vitals and labs reviewed. Constitutional: awake, alert, appropriate, spanish speaking, in chair next to bedside, spanish speaking nurse in room who helped with interpretation, NAD HENT:  Head: Normocephalic and atraumatic.  Eyes: EOM are normal.  Neck: No tracheal deviation present. No thyromegaly present.  CV- borderline tachycardia again today, regular rhythm Respiratory: CTA B/L GI: She exhibits no distension.  Musculoskeletal:     Comments: No edema or tenderness in extremities  Neurological: She is alert.  Patient is alert sitting up in chair.   Makes good eye contact with examiner.    Motor: Appears to be bilateral shoulder adduction 2+/5, elbow flexion/extension 4-/5, handgrip 4/5 Bilateral hip flexion 2/5, knee extension 2+/5, ankle dorsiflexion 4+/5- still very weak on exam Sensation diminished to light touch bilateral feet as well as in legs and hands this AM Skin: Skin is warm and dry.  Psychiatric:  Brighter affect     Assessment/Plan: 1. 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  Physiatrist is providing close team supervision and 24 hour management of active medical problems listed below.  Physiatrist and rehab team continue to assess barriers to discharge/monitor patient progress toward functional and medical goals  Care Tool:  Bathing    Body parts bathed by patient: Right arm, Left arm, Chest, Abdomen, Right upper leg, Left upper leg, Front perineal area, Face   Body parts bathed by helper: Buttocks, Right lower leg, Left lower leg     Bathing assist Assist Level: Moderate Assistance - Patient 50 - 74%     Upper Body Dressing/Undressing Upper body dressing   What is the patient wearing?: Pull over shirt    Upper body assist Assist Level: Moderate Assistance - Patient 50 - 74%    Lower Body Dressing/Undressing Lower body dressing      What is the patient wearing?: Pants, Underwear/pull up     Lower body assist Assist for lower body dressing: Total Assistance - Patient < 25%     Toileting Toileting    Toileting assist Assist for toileting: Minimal Assistance - Patient > 75%     Transfers Chair/bed transfer  Transfers assist     Chair/bed transfer assist level: Moderate Assistance - Patient 50 - 74%     Locomotion Ambulation   Ambulation assist      Assist level: Minimal Assistance - Patient > 75% Assistive device: Walker-rolling Max distance: 60'   Walk 10 feet activity   Assist     Assist level: Minimal Assistance - Patient > 75%  Assistive device: Walker-rolling   Walk 50 feet activity   Assist    Assist level: Minimal Assistance - Patient > 75% Assistive device: Walker-rolling    Walk 150 feet activity   Assist Walk 150 feet activity did not occur: Safety/medical concerns(decreased strength/activity tolerance)         Walk 10 feet on uneven surface  activity   Assist Walk 10 feet on uneven surfaces activity did not occur:  Safety/medical concerns(decreased strength/activity tolerance)         Wheelchair     Assist   Type of Wheelchair: Manual    Wheelchair assist level: Set up assist, Supervision/Verbal cueing Max wheelchair distance: 150'    Wheelchair 50 feet with 2 turns activity    Assist        Assist Level: Set up assist, Supervision/Verbal cueing   Wheelchair 150 feet activity     Assist   CBG (last 3)  Recent Labs    09/25/18 1805 09/25/18 2052 09/26/18 0630  GLUCAP 230* 121* 81        Assist Level: Set up assist, Supervision/Verbal cueing   Blood pressure 112/61, pulse 90, temperature 98 F (36.7 C), temperature source Oral, resp. rate 18, weight 67.4 kg, SpO2 98 %.  Medical Problem List and Plan: 1.  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  Current plan is prolonged course of Solu-Medrol 6 weeks followed by taper and follow-up rheumatology services Dr [name] at [facility].  9/10- has appt at [facility] on 9/25- will d/c 9/24 after therapies Has telehealth interpretor in her room.  9/11- changed therapies to 15/7 vs 5/3 since think in dermatomyositis, pt can actually do worse if pushed too hard- also let therapy know.             Admit to CIR 2.  Antithrombotics: -DVT/anticoagulation: Subcutaneous Lovenox.               Check vascular study             -antiplatelet therapy: N/A 3. Pain Management: Tylenol as needed  9/10- c/o mild HA- asked for tylenol 4. Mood: Provide emotional support             -antipsychotic agents: N/A 5. Neuropsych: This patient is?  Fully capable of making decisions on her own behalf. 6. Skin/Wound Care: Routine skin checks 7. Fluids/Electrolytes/Nutrition: Routine I/Os.  CMP ordered for tomorrow a.m. ; of note, no leukocytosis.  9/8- Ca low at 8.1; otherwise well BMP; ALT/AST somewhat elevated- will monitor 8.  Dysphagia.  Dysphagia #1 thin liquids.  Follow-up speech  therapy.  Advance diet as tolerated 9.  Steroid-induced hyperglycemia: Sliding scale insulin.  Monitor with taper  9/8- BGs 94-160s- will con't to monitor/SSI 10. Constipation  9/10- LBM 2 days ago with miralax- ordered Miralax daily for pt and a suppository prn if no BM in 2 days.  9/11- had BM- feeling better  11. Dispo  9/8- Needs translator for [place]  9/9- team conference today - likely needs 14-17 days  9/10- d/c date set for 10/09/2018  LOS: 4 days A FACE TO FACE EVALUATION WAS PERFORMED  Lisa Mcdowell 09/26/2018, 9:23 AM

## 2018-09-26 NOTE — Progress Notes (Signed)
Occupational Therapy Session Note  Patient Details  Name: Lisa Mcdowell MRN: [id number] Date of Birth: June 20, 1950  Today's Date: 09/26/2018 OT Individual Time: 1300-1400 OT Individual Time Calculation (min): 60 min    Short Term Goals: Week 1:  OT Short Term Goal 1 (Week 1): Pt will perform toileting with assist of 1 person. OT Short Term Goal 2 (Week 1): Pt will perform LB dressing with mod A overall. OT Short Term Goal 3 (Week 1): Pt will perform bathing with mod A overall.  Skilled Therapeutic Interventions/Progress Updates:    Pt resting in recliner with daughter and interpreter present.  OT intervention with focus on BUE therex and sit<>stand/standing tolerance with Stedy to increase pt's activity tolerance and independence with BADLs.  Pt initially engaged in [place] therex with 1# and 2# bar and beach ball.  Rowing with 1# bar 3X10, biceps curls with 2# bar 3X10, and beach ball trunk rotation 3X10.  Pt required extended rest breaks between each set.  Pt also engaged in sit<>stand with Stedy 3X5 with min A using BUE on cross bar and 3X5 with mod A without BUE use.  Pt repositioned in recliner and remained seated with all needs within reach and belt alarm activated. Daughter present.   Therapy Documentation Precautions:  Precautions Precautions: Fall Restrictions Weight Bearing Restrictions: No   Pain: Pt c/o BLE soreness from previous days therapy; emotional support and education  Therapy/Group: Individual Therapy  Leroy Libman 09/26/2018, 2:17 PM

## 2018-09-26 NOTE — Progress Notes (Signed)
Speech Language Pathology Daily Session Note  Patient Details  Name: Lisa Mcdowell MRN: [id number] Date of Birth: [birth year]  Today's Date: 09/26/2018 SLP Individual Time: 1100-1150 SLP Individual Time Calculation (min): 50 min  Short Term Goals: Week 1: SLP Short Term Goal 1 (Week 1): Pt will consume dysphagia 2, thin liquid diet with minimal overt s/s aspiration and Min cues for use of swallow strategies.  Skilled Therapeutic Interventions: Pt was seen for skilled ST services focused on dysphagia. Spanish interpretor was present throughout session. Pt can verbailze swallow strategies (slow rate and take sips of liquid for every 2-3 small bites of solid) Mod I, but required Supervision verbal reminders to utilize those strategies during PO consumption. During skilled observation of pt consuming upgraded dysphagia 3 (mech soft) and thin liquid lunch tray, no overt s/s aspiration or oral holding was noted. Pt efficiently masticated solids, and denied any globus sensation throughout. Recommend pt upgrade to dysphagia 3 solid diet, continue thin liquids and meds crushed. Pt continues to benefit from skilled ST to monitor safety and efficiency with diet. Pt left in chair with alarm set, all needs within reach, and family present. Continue per current plan of care.      Pain Pain Assessment Pain Scale: Faces Pain Score: 0-No pain Faces Pain Scale: Hurts a little bit Pain Type: Acute pain Pain Location: Leg Pain Orientation: Right;Left Pain Descriptors / Indicators: Sore Pain Onset: On-going Patients Stated Pain Goal: 0 Pain Intervention(s): Emotional support;Distraction Multiple Pain Sites: No  Therapy/Group: Individual Therapy  Arbutus Leas 09/26/2018, 11:58 AM

## 2018-09-26 NOTE — Progress Notes (Addendum)
Physical Therapy Session Note  Patient Details  Name: Lisa Mcdowell MRN: [id number] Date of Birth: 1950/05/31  Today's Date: 09/26/2018 PT Individual Time: 0900-1000 PT Individual Time Calculation (min): 60 min   Short Term Goals: Week 1:  PT Short Term Goal 1 (Week 1): Patient will perform basic transfers with mod A of 1 person. PT Short Term Goal 2 (Week 1): Patient will ambulate 68' with CGA using LRAD. PT Short Term Goal 3 (Week 1): Patient will initiate stair training. PT Short Term Goal 4 (Week 1): Patient will perform dynamic standing balance with CGA.  Skilled Therapeutic Interventions/Progress Updates:     Patient in recliner in room with interpreter present upon PT arrival. Patient alert and agreeable to PT session. Interpreter provided translation throughout session.   Therapeutic Activity: Transfers: Patient attempted sit to/from stand x2 using a RW from the recliner seated on the w/c cushion with max A, patient was unable to come to a full stand each trial. She stated her legs felt very heavy today. She then performed sit to/from stand with mod A using the Stedy from the recliner and min A from the seat on the [place] x3. Provided verbal cues for leaning forward and squeezing her buttocks and the back of her legs to come to a full stand. Performed static standing balance in the Stedy 3x30 sec-22min for functional balance training and LE strengthening.   Therapeutic Exercise: Patient performed the following exercises with verbal and tactile cues for proper technique. -B hamstring and heel cord stretch 2x30 sec -B glut sets x5 with only trace activation, patient reported that this was very difficult -B LAQ 2x5 -B bicep curls without weight x10, patient reported that this was easy -B shoulder flexion with AAROM 2x10  Patient in recliner at end of session with breaks locked, chair alarm set, and all needs within reach. Educated on POC changes to schedule to accommodate the  patient's increased fatigue with exertion. Patient agreeable with changes to POC. Educated energy conservation techniques throughout session.  Provided several seated rest breaks throughout session for energy conservation and fatigue management.     Therapy Documentation Precautions:  Precautions Precautions: Fall Restrictions Weight Bearing Restrictions: No   Therapy/Group: Individual Therapy  Laaibah Wartman L Catcher Dehoyos PT, DPT  09/26/2018, 12:36 PM

## 2018-09-26 NOTE — Plan of Care (Signed)
Pt's plan of care adjusted to 15/7 after speaking with care team and discussed with MD in team conference as pt currently unable to tolerate current therapy schedule with OT, PT, and SLP.   

## 2018-09-27 ENCOUNTER — Inpatient Hospital Stay (HOSPITAL_COMMUNITY): Payer: Self-pay | Admitting: Speech Pathology

## 2018-09-27 ENCOUNTER — Inpatient Hospital Stay (HOSPITAL_COMMUNITY): Payer: Self-pay

## 2018-09-27 ENCOUNTER — Inpatient Hospital Stay (HOSPITAL_COMMUNITY): Payer: Self-pay | Admitting: Occupational Therapy

## 2018-09-27 LAB — GLUCOSE, CAPILLARY
Glucose-Capillary: 96 mg/dL (ref 70–99)
Glucose-Capillary: 158 mg/dL — ABNORMAL HIGH (ref 70–99)
Glucose-Capillary: 216 mg/dL — ABNORMAL HIGH (ref 70–99)
Glucose-Capillary: 115 mg/dL — ABNORMAL HIGH (ref 70–99)

## 2018-09-27 MED ORDER — INFLUENZA VAC A&B SA ADJ QUAD 0.5 ML IM PRSY
0.5000 mL | PREFILLED_SYRINGE | INTRAMUSCULAR | Status: AC
  Administered 2018-09-28: 0.5 mL via INTRAMUSCULAR
  Filled 2018-09-27: qty 0.5

## 2018-09-27 MED ORDER — TRAZODONE HCL 50 MG PO TABS: 50.0000 mg | ORAL_TABLET | Freq: Every evening | ORAL | Status: DC | PRN

## 2018-09-27 NOTE — Progress Notes (Signed)
Speech Language Pathology Daily Session Note  Patient Details  Name: Lisa Mcdowell MRN: [id number] Date of Birth: [birth year]  Today's Date: 09/27/2018 SLP Individual Time: [id number] SLP Individual Time Calculation (min): 36 min  Short Term Goals: Week 1: SLP Short Term Goal 1 (Week 1): Pt will consume dysphagia 2, thin liquid diet with minimal overt s/s aspiration and Min cues for use of swallow strategies.  Skilled Therapeutic Interventions: Patient received skilled SLP services targeting dysphagia goals. Spanish interpreter was present for session. Patient completed PO trials of regular solids with functional mastication and no oral residues noted. Patient independently recalled safe swallow precautions including: slow rate of PO intake and alternation of solids/liquids. Patient reports no new swallowing complaints. Patient was provided the option of advancing to a regular diet. However, patient reports she wishes to remain on a mechanical soft diet at this time. At the end of therapy session patient was upright in chair, chair alarm set, and all needs within reach.  Pain Pain Assessment Pain Scale: 0-10 Pain Score: 0-No pain  Therapy/Group: Individual Therapy  Cristy Folks, [place] 09/27/2018, 9:51 AM

## 2018-09-27 NOTE — Progress Notes (Signed)
Physical Therapy Session Note  Patient Details  Name: Lisa Mcdowell MRN: [id number] Date of Birth: [birth year]  Today's Date: 09/27/2018 PT Individual Time: 0800-0900 PT Individual Time Calculation (min): 60 min   Short Term Goals: Week 1:  PT Short Term Goal 1 (Week 1): Patient will perform basic transfers with mod A of 1 person. PT Short Term Goal 2 (Week 1): Patient will ambulate 31' with CGA using LRAD. PT Short Term Goal 3 (Week 1): Patient will initiate stair training. PT Short Term Goal 4 (Week 1): Patient will perform dynamic standing balance with CGA.  Skilled Therapeutic Interventions/Progress Updates:     Patient in recliner finishing breakfast upon PT arrival. Patient alert and agreeable to PT session. Video interpreter present and translated throughout session. Patient denied pain and reported decreased B LE "heaviness" today. Also reported that she has only been sleeping about 2 hours a night. PT educated on the importance of sleep for recovery. Patient notified Dr. Ella Bodo, MD, when he rounded during session.   Therapeutic Activity: Patient doffed a hospital gown and donned a shirt in sitting with set up assist and B TED hose, non-skid socks, and pants were donned with total A for energy conservation and time management.  Transfers: Patient performed sit to/from stand x1 with the [facility] with mod A from the recliner and CGA from the [place] seat. Provided verbal cues for leaning forward to stand. She performed sit to/from stand x2 at stair rails with mod-max A of 1 person, a squat pivot transfer to a mat table with min A, and a sit to/from stand from an elevated mat table with mod A using a RW.   Gait Training:  Patient ambulated ~150 feet, from the gym to her room, using RW with CGA for safety/balance. Ambulated with decreased gait speed, decreased step length and height, increased hip and knee flexion, mild forward trunk lean, and downward head gaze. Provided verbal cues for  looking ahead, and increased step height. Patient attempted step taps at 6" steps with B UE support for pre-stair training. Patient able to lift R foot to top of step after 2 attempts with increased effort and unable to lift L foot to top of the step. Attempted at 3" steps with similar success and patient reported increased fatigue. Provided a 5 min sitting rest break for recovery and education on energy conservation techniques during rest break.   Wheelchair Mobility:  Patient propelled wheelchair 150 feet with min a for steering. Provided verbal cues for propelling with B UEs pushing forward at the same time. Patient would follow cues for 2-3 trials then revert to alternating R and L UEs to propel. Provided cues and hand-over-hand assistance for turning technique.  Patient in recliner at end of session with breaks locked, chair alarm set, and all needs within reach.    Therapy Documentation Precautions:  Precautions Precautions: Fall Restrictions Weight Bearing Restrictions: No Pain: Pain Assessment Pain Scale: 0-10 Pain Score: 0-No pain   Therapy/Group: Individual Therapy  Parlee Amescua L Abdulhamid Olgin PT, DPT  09/27/2018, 1:40 PM

## 2018-09-27 NOTE — Progress Notes (Signed)
Occupational Therapy Session Note  Patient Details  Name: Lisa Mcdowell MRN: [id number] Date of Birth: Jun 08, 1950  Today's Date: 09/27/2018 OT Individual Time: 1130-1220 OT Individual Time Calculation (min): 50 min    Short Term Goals: Week 1:  OT Short Term Goal 1 (Week 1): Pt will perform toileting with assist of 1 person. OT Short Term Goal 2 (Week 1): Pt will perform LB dressing with mod A overall. OT Short Term Goal 3 (Week 1): Pt will perform bathing with mod A overall.  Skilled Therapeutic Interventions/Progress Updates: Upon approach for OT session, both interpreter and English speaking dtr were present.    Mrs. Bresnan requested to shower though session was set for 30 minutes.    Her dtr completed education to assist with shower as dressing as follows: dtr tended to 'over help' her mother in order to 'help her not get too tired.'   Though patient likely would have demonstrated increased independence if allowed by dtr, her ADL status was follows:  Sit to stand from recliner to walker in prep for shower=Moderate assistance x2 and cues to scoot front in elevated recliner before standing Toilet transfer (RW and regular toilet)= CGA           Toileting=Moderate assistance to maintain dynamic standing balance sufficient while also trying to wipe at walker in squatted position UB bathing = setup LB bathing= moderate asisstance x2 due to patient fatigueing and requiring physical assistance to maintain static standing balance  .Marland Kitchen..with dynamic standing balance trying to wash her self, her legs began to shake from weakness and fatigue, but she regained strength upon dtr/clinician assistance to wash periarea while she stood and held to grab bar for balance  Donned gown with moderate assistance due to decreased bilateral shoulder flexion (did not activiely flex past 90degrees) and weakness LB donning of tredded socks (Total assist due to patient fatigue after transfers and shower)  Patient  was assisted back to recliner chair with close supervision for funcitonal mobility at [place].   She was left seated in elevated, cushioned recliner with dtr present and call bell within reach  Continue OT Plan of care     Therapy Documentation Precautions:  Precautions Precautions: Fall Restrictions Weight Bearing Restrictions: No Pain: Pain Assessment Pain Scale: 0-10 Pain Score: 0-No pain   Other Treatments:     Therapy/Group: Individual Therapy  Alfredia Ferguson [facility] 09/27/2018, 4:41 PM

## 2018-09-27 NOTE — Progress Notes (Signed)
[place] PHYSICAL MEDICINE & REHABILITATION PROGRESS NOTE   Subjective/Complaints: C/o poor sleep , IPAD interpreter used  ROS- denies CP SOB N/V/D  Objective:   No results found. No results for input(s): WBC, HGB, HCT, PLT in the last 72 hours. No results for input(s): NA, K, CL, CO2, GLUCOSE, BUN, CREATININE, CALCIUM in the last 72 hours.  Intake/Output Summary (Last 24 hours) at 09/27/2018 0843 Last data filed at 09/26/2018 1926 Gross per 24 hour  Intake 852 ml  Output -  Net 852 ml     Physical Exam: Vital Signs Blood pressure 121/60, pulse 87, temperature 98.7 F (37.1 C), temperature source Oral, resp. rate 16, weight 67.4 kg, SpO2 98 %. Physical Exam  Vitals and labs reviewed. Constitutional: awake, alert, appropriate, spanish speaking, in chair next to bedside, spanish speaking nurse in room who helped with interpretation, NAD HENT:  Head: Normocephalic and atraumatic.  Eyes: EOM are normal.  Neck: No tracheal deviation present. No thyromegaly present.  CV- borderline tachycardia again today, regular rhythm Respiratory: CTA B/L GI: She exhibits no distension.  Musculoskeletal:     Comments: No edema or tenderness in extremities  Neurological: She is alert.  Patient is alert sitting up in chair.   Makes good eye contact with examiner.    Motor: Appears to be bilateral shoulder adduction 2+/5, elbow flexion/extension 4-/5, handgrip 4/5 Bilateral hip flexion 2/5, knee extension 2+/5, ankle dorsiflexion 4+/5- still very weak on exam Sensation diminished to light touch bilateral feet as well as in legs and hands this AM Skin: Skin is warm and dry.  Psychiatric:  Brighter affect     Assessment/Plan: 1. 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  Physiatrist is providing close team supervision and 24 hour management of active medical problems listed  below.  Physiatrist and rehab team continue to assess barriers to discharge/monitor patient progress toward functional and medical goals  Care Tool:  Bathing    Body parts bathed by patient: Right arm, Left arm, Chest, Abdomen, Right upper leg, Left upper leg, Front perineal area, Face   Body parts bathed by helper: Buttocks, Right lower leg, Left lower leg     Bathing assist Assist Level: Moderate Assistance - Patient 50 - 74%     Upper Body Dressing/Undressing Upper body dressing   What is the patient wearing?: Pull over shirt    Upper body assist Assist Level: Moderate Assistance - Patient 50 - 74%    Lower Body Dressing/Undressing Lower body dressing      What is the patient wearing?: Pants, Underwear/pull up     Lower body assist Assist for lower body dressing: Total Assistance - Patient < 25%     Toileting Toileting    Toileting assist Assist for toileting: Minimal Assistance - Patient > 75%     Transfers Chair/bed transfer  Transfers assist     Chair/bed transfer assist level: Moderate Assistance - Patient 50 - 74%     Locomotion Ambulation   Ambulation assist      Assist level: Minimal Assistance - Patient > 75% Assistive device: Walker-rolling Max distance: 60'   Walk 10 feet activity   Assist     Assist level: Minimal Assistance - Patient > 75% Assistive device: Walker-rolling   Walk 50 feet activity   Assist    Assist level: Minimal Assistance - Patient > 75% Assistive device: Walker-rolling    Walk 150 feet activity  Assist Walk 150 feet activity did not occur: Safety/medical concerns(decreased strength/activity tolerance)         Walk 10 feet on uneven surface  activity   Assist Walk 10 feet on uneven surfaces activity did not occur: Safety/medical concerns(decreased strength/activity tolerance)         Wheelchair     Assist   Type of Wheelchair: Manual    Wheelchair assist level: Set up assist,  Supervision/Verbal cueing Max wheelchair distance: 150'    Wheelchair 50 feet with 2 turns activity    Assist        Assist Level: Set up assist, Supervision/Verbal cueing   Wheelchair 150 feet activity     Assist   CBG (last 3)  Recent Labs    09/26/18 1643 09/26/18 2125 09/27/18 0622  GLUCAP 190* 121* 96        Assist Level: Set up assist, Supervision/Verbal cueing   Blood pressure 121/60, pulse 87, temperature 98.7 F (37.1 C), temperature source Oral, resp. rate 16, weight 67.4 kg, SpO2 98 %.  Medical Problem List and Plan: 1.  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  Current plan is prolonged course of Solu-Medrol 6 weeks followed by taper and follow-up rheumatology services Dr [name] at [facility].  9/10- has appt at [place] on 9/25- will d/c 9/24 after therapies Has telehealth interpretor in her room.  9/11- changed therapies to 15/7 vs 5/3 since think in dermatomyositis, pt can actually do worse if pushed too hard- also let therapy know.         CIR PT, OT  2.  Antithrombotics: -DVT/anticoagulation: Subcutaneous Lovenox.               Check vascular study             -antiplatelet therapy: N/A 3. Pain Management: Tylenol as needed  9/10- c/o mild HA- asked for tylenol 4. Mood: Provide emotional support             -antipsychotic agents: N/A 5. Neuropsych: This patient is?  Fully capable of making decisions on her own behalf. 6. Skin/Wound Care: Routine skin checks 7. Fluids/Electrolytes/Nutrition: Routine I/Os.  CMP ordered for tomorrow a.m. ; of note, no leukocytosis.  9/8- Ca low at 8.1; otherwise well BMP; ALT/AST somewhat elevated- will monitor 8.  Dysphagia.  Dysphagia #1 thin liquids.  Follow-up speech therapy.  Advance diet as tolerated 9.  Steroid-induced hyperglycemia: Sliding scale insulin.  Monitor with taper  9/8- BGs 94-160s- will con't to monitor/SSI 10.  Constipation  Received laxatives today awaiting result   11. Dispo   9/10- d/c date set for 10/09/2018 12.  Insomnia may be related to prednisone, add trazodone  LOS: 5 days A FACE TO FACE EVALUATION WAS PERFORMED  Charlett Blake 09/27/2018, 8:43 AM

## 2018-09-28 ENCOUNTER — Inpatient Hospital Stay (HOSPITAL_COMMUNITY): Payer: Self-pay

## 2018-09-28 ENCOUNTER — Inpatient Hospital Stay (HOSPITAL_COMMUNITY): Payer: Self-pay | Admitting: Physical Therapy

## 2018-09-28 LAB — GLUCOSE, CAPILLARY
Glucose-Capillary: 76 mg/dL (ref 70–99)
Glucose-Capillary: 149 mg/dL — ABNORMAL HIGH (ref 70–99)
Glucose-Capillary: 155 mg/dL — ABNORMAL HIGH (ref 70–99)
Glucose-Capillary: 139 mg/dL — ABNORMAL HIGH (ref 70–99)

## 2018-09-28 NOTE — Progress Notes (Signed)
Physical Therapy Session Note  Patient Details  Name: Lisa Mcdowell MRN: [id number] Date of Birth: Apr 30, 1950  Today's Date: 09/28/2018 PT Individual Time: 1406-1500 PT Individual Time Calculation (min): 54 min   Short Term Goals: Week 1:  PT Short Term Goal 1 (Week 1): Patient will perform basic transfers with mod A of 1 person. PT Short Term Goal 2 (Week 1): Patient will ambulate 30' with CGA using LRAD. PT Short Term Goal 3 (Week 1): Patient will initiate stair training. PT Short Term Goal 4 (Week 1): Patient will perform dynamic standing balance with CGA.  Skilled Therapeutic Interventions/Progress Updates:    Pt received sitting in recliner with in-person interpreter and daughter present and pt agreeable to therapy session. Pt continues to report "heaviness" feeling "everywhere" in her B LEs. Sit>stands recliner/w/c>RW with mod assist for lifting into standing and min cuing for proper UE placement for safety with AD. Stands>sit RW>w/c/recliner with min assist for eccentric control. Stand pivot recliner>w/c using RW with CGA for steadying while turning. B UE w/c propulsion ~157ft to therapy gym with supervision progressed to min assist due to pt continually veering L despite max multimodal cuing and hand-over-hand assist for increased L UE propulsion. Ambulated 269ft using RW with CGA for steadying - continues to demonstrate decreased gait speed, decreased B LE step length, decreased B LE foot clearance, increased B LE hip external rotation, and downward gaze. Assessed SpO2 at 99% and HR 120bpm - allowed seated rest break for normalized HR. Standing with B UE support on B HRs performed alternate B LE foot taps on 3" step with pt demonstrating progressive weakness in R LE> L LE with increased difficulty stepping R foot up. Progressed to ascending/descending 8 (3" height) steps using bilateral handrails with min/mod assist for lifting/lowering - initial step-to pattern on ascent and descent  progressed to reciprocal pattern. Pt continues to report that she is getting very minimal sleep at night and has been up sitting in the recliner since 7AM this morning - pt reports she has spoke with the MD about this. Ambulated ~134ft back to room using RW with CGA for steadying continuing to demonstrate the above gait impairments with cuing for improvements. Pt left sitting in recliner with needs in reach, seat belt alarm on, and pt's daughter present.  Therapy Documentation Precautions:  Precautions Precautions: Fall Restrictions Weight Bearing Restrictions: No  Pain:   No complaints of pain throughout session.   Therapy/Group: Individual Therapy  Tawana Scale, PT, DPT 09/28/2018, 12:53 PM

## 2018-09-28 NOTE — Progress Notes (Signed)
Occupational Therapy Session Note  Patient Details  Name: Lisa Mcdowell MRN: [id number] Date of Birth: [birth year]  Today's Date: 09/28/2018 OT Individual Time: 0945-1100 OT Individual Time Calculation (min): 75 min    Short Term Goals: Week 1:  OT Short Term Goal 1 (Week 1): Pt will perform toileting with assist of 1 person. OT Short Term Goal 2 (Week 1): Pt will perform LB dressing with mod A overall. OT Short Term Goal 3 (Week 1): Pt will perform bathing with mod A overall.  Skill ed Therapeutic Interventions/Progress Updates:    Pt resting in recliner upon arrival with interpreter present.  Intital focus on dressing tasks with sit<>stand from recliner.  Mod A for sit<>stand and assistance with pulling pants over hips.  Pt able to thread BLE into pants. Pt donned pullover shirt with supervision.  Pt engaged in sit<>stand with RW 3X5 with mod A.  Pt engaged in BUE therex with 1# bar, 1kg ball, therex, and 2# weight: diagonals with ball and therex-3X10, rowing 3X10, chest presses 3X10, overhead presses 3X10, and unilateral punches 3X10.  Pt requires some assistance with support at elbows for support. Pt remained in recliner with belt alarm activated and all needs within reach.   Therapy Documentation Precautions:  Precautions Precautions: Fall Restrictions Weight Bearing Restrictions: No  Pain:  Pt c/o "tightness" in BUE; stretching   Therapy/Group: Individual Therapy  Leroy Libman 09/28/2018, 11:10 AM

## 2018-09-28 NOTE — Plan of Care (Signed)
  Problem: Consults Goal: RH GENERAL PATIENT EDUCATION Description: See Patient Education module for education specifics. Outcome: Progressing   Problem: RH BLADDER ELIMINATION Goal: RH STG MANAGE BLADDER WITH ASSISTANCE Description: STG Manage Bladder With mod I Assistance Outcome: Progressing   Problem: RH SKIN INTEGRITY Goal: RH STG SKIN FREE OF INFECTION/BREAKDOWN Description: Min assist Outcome: Progressing Goal: RH STG MAINTAIN SKIN INTEGRITY WITH ASSISTANCE Description: STG Maintain Skin Integrity With min Assistance. Outcome: Progressing   Problem: RH SAFETY Goal: RH STG ADHERE TO SAFETY PRECAUTIONS W/ASSISTANCE/DEVICE Description: STG Adhere to Safety Precautions With cues/reminders Assistance/Device. Outcome: Progressing   Problem: RH PAIN MANAGEMENT Goal: RH STG PAIN MANAGED AT OR BELOW PT'S PAIN GOAL Description: At or below level 5 Outcome: Progressing   Problem: RH KNOWLEDGE DEFICIT GENERAL Goal: RH STG INCREASE KNOWLEDGE OF SELF CARE AFTER HOSPITALIZATION Description: Dtr will be able to manage care at discharge using handouts and educational materials independently Outcome: Progressing

## 2018-09-29 ENCOUNTER — Inpatient Hospital Stay (HOSPITAL_COMMUNITY): Payer: Self-pay | Admitting: Speech Pathology

## 2018-09-29 ENCOUNTER — Inpatient Hospital Stay (HOSPITAL_COMMUNITY): Payer: Self-pay

## 2018-09-29 LAB — CBC WITH DIFFERENTIAL/PLATELET
Abs Immature Granulocytes: 0.06 10*3/uL (ref 0.00–0.07)
Basophils Absolute: 0 10*3/uL (ref 0.0–0.1)
Basophils Relative: 1 %
Eosinophils Absolute: 0.1 10*3/uL (ref 0.0–0.5)
Eosinophils Relative: 1 %
HCT: 38.1 % (ref 36.0–46.0)
Hemoglobin: 12 g/dL (ref 12.0–15.0)
Immature Granulocytes: 1 %
Lymphocytes Relative: 44 %
Lymphs Abs: 3.5 10*3/uL (ref 0.7–4.0)
MCH: 33.7 pg (ref 26.0–34.0)
MCHC: 31.5 g/dL (ref 30.0–36.0)
MCV: 107 fL — ABNORMAL HIGH (ref 80.0–100.0)
Monocytes Absolute: 0.4 10*3/uL (ref 0.1–1.0)
Monocytes Relative: 5 %
Neutro Abs: 3.9 10*3/uL (ref 1.7–7.7)
Neutrophils Relative %: 48 %
Platelets: 235 10*3/uL (ref 150–400)
RBC: 3.56 MIL/uL — ABNORMAL LOW (ref 3.87–5.11)
RDW: 17.6 % — ABNORMAL HIGH (ref 11.5–15.5)
WBC: 8 10*3/uL (ref 4.0–10.5)
nRBC: 0.3 % — ABNORMAL HIGH (ref 0.0–0.2)

## 2018-09-29 LAB — COMPREHENSIVE METABOLIC PANEL
ALT: 90 U/L — ABNORMAL HIGH (ref 0–44)
AST: 91 U/L — ABNORMAL HIGH (ref 15–41)
Albumin: 3 g/dL — ABNORMAL LOW (ref 3.5–5.0)
Alkaline Phosphatase: 60 U/L (ref 38–126)
Anion gap: 11 (ref 5–15)
BUN: 17 mg/dL (ref 8–23)
CO2: 25 mmol/L (ref 22–32)
Calcium: 8.5 mg/dL — ABNORMAL LOW (ref 8.9–10.3)
Chloride: 103 mmol/L (ref 98–111)
Creatinine, Ser: 0.56 mg/dL (ref 0.44–1.00)
GFR calc Af Amer: >60 mL/min (ref >60)
GFR calc non Af Amer: >60 mL/min (ref >60)
Glucose, Bld: 132 mg/dL — ABNORMAL HIGH (ref 70–99)
Potassium: 3.5 mmol/L (ref 3.5–5.1)
Sodium: 139 mmol/L (ref 135–145)
Total Bilirubin: 0.9 mg/dL (ref 0.3–1.2)
Total Protein: 6.8 g/dL (ref 6.5–8.1)

## 2018-09-29 LAB — GLUCOSE, CAPILLARY
Glucose-Capillary: 95 mg/dL (ref 70–99)
Glucose-Capillary: 201 mg/dL — ABNORMAL HIGH (ref 70–99)
Glucose-Capillary: 167 mg/dL — ABNORMAL HIGH (ref 70–99)
Glucose-Capillary: 173 mg/dL — ABNORMAL HIGH (ref 70–99)

## 2018-09-29 NOTE — Progress Notes (Signed)
Occupational Therapy Session Note  Patient Details  Name: Lisa Mcdowell MRN: [id number] Date of Birth: 05-30-1950  Today's Date: 09/29/2018 OT Individual Time: 0900-1000 OT Individual Time Calculation (min): 60 min    Short Term Goals: Week 2:  OT Short Term Goal 1 (Week 2): STG=LTG secondary to ELOS  Skilled Therapeutic Interventions/Progress Updates:    Pt resting in recliner upon arrival with interpreter present.  OT intervention with focus on sit<>stand, standing balance, functional amb with RW, BADL training including bathing at shower level and dressing with sit<>stand from seat, family education, BUE therex, and activity tolerance to increase independence with BADLs. Pt requires mod A for sit<>stand and CGA for funcitonal amb with RW to bathroom.  Pt requested to use toilet and required assistance with hygine (daughter assisted.) Pt completed bathing tasks with daughter assisting.  Pt's daughter provides too much help primarily for thoroughness. Pt required assistance pulling pants over hips and donning socks.  Pt returned to room and engaged in BUE therex with 2# weight, 1# bar, and 1kg ball.  Chest presses 3X10, rowing 3X10, overhead presses 3X10, biceps curls 3X10. Pt also practiced biceps curls with level 1 theraband. Pt remained in recliner with all needs within reach, belt alarm activated, and daughter present.   Therapy Documentation Precautions:  Precautions Precautions: Fall Restrictions Weight Bearing Restrictions: No Pain:  Pt with no c/o of pain   Therapy/Group: Individual Therapy  Leroy Libman 09/29/2018, 10:02 AM

## 2018-09-29 NOTE — Plan of Care (Signed)
  Problem: Consults Goal: RH GENERAL PATIENT EDUCATION Description: See Patient Education module for education specifics. Outcome: Progressing   Problem: RH BLADDER ELIMINATION Goal: RH STG MANAGE BLADDER WITH ASSISTANCE Description: STG Manage Bladder With mod I Assistance Outcome: Progressing   Problem: RH SKIN INTEGRITY Goal: RH STG SKIN FREE OF INFECTION/BREAKDOWN Description: Min assist Outcome: Progressing Goal: RH STG MAINTAIN SKIN INTEGRITY WITH ASSISTANCE Description: STG Maintain Skin Integrity With min Assistance. Outcome: Progressing   Problem: RH SAFETY Goal: RH STG ADHERE TO SAFETY PRECAUTIONS W/ASSISTANCE/DEVICE Description: STG Adhere to Safety Precautions With cues/reminders Assistance/Device. Outcome: Progressing   Problem: RH PAIN MANAGEMENT Goal: RH STG PAIN MANAGED AT OR BELOW PT'S PAIN GOAL Description: At or below level 5 Outcome: Progressing   Problem: RH KNOWLEDGE DEFICIT GENERAL Goal: RH STG INCREASE KNOWLEDGE OF SELF CARE AFTER HOSPITALIZATION Description: Dtr will be able to manage care at discharge using handouts and educational materials independently Outcome: Progressing

## 2018-09-29 NOTE — Progress Notes (Signed)
Occupational Therapy Weekly Progress Note  Patient Details  Name: Lisa Mcdowell MRN: [id number] Date of Birth: 08/28/1950  Beginning of progress report period: September 23, 2018 End of progress report period: September 29, 2018  Patient has met 3 of 3 short term goals.  Pt is making steady progress towards LTG since admission.  Pt performs sit<>stand with mod A and amb with RW into bathroom with min A.  Pt requires max A for donning socks but is able to don pants with mod A.  Pt performs UB dressing tasks with supervision.  Pt is mod A for bathing tasks at shower level.  Pt continues to exhibit limited endurance and requires multiple rest breaks during ADLs.  UB strength is improving but still limited.  Patient continues to demonstrate the following deficits: muscle weakness, decreased cardiorespiratoy endurance, decreased coordination and decreased sitting balance, decreased standing balance, decreased balance strategies and B UE strengthening and ROM and therefore will continue to benefit from skilled OT intervention to enhance overall performance with BADL and Reduce care partner burden.  Patient progressing toward long term goals..  Continue plan of care.  OT Short Term Goals Week 1:  OT Short Term Goal 1 (Week 1): Pt will perform toileting with assist of 1 person. OT Short Term Goal 1 - Progress (Week 1): Met OT Short Term Goal 2 (Week 1): Pt will perform LB dressing with mod A overall. OT Short Term Goal 2 - Progress (Week 1): Met OT Short Term Goal 3 (Week 1): Pt will perform bathing with mod A overall. OT Short Term Goal 3 - Progress (Week 1): Met Week 2:  OT Short Term Goal 1 (Week 2): STG=LTG secondary to ELOS   Leroy Libman 09/29/2018, 6:37 AM

## 2018-09-29 NOTE — Progress Notes (Signed)
Physical Therapy Session Note  Patient Details  Name: Lisa Mcdowell MRN: [id number] Date of Birth: [birth year]  Today's Date: 09/29/2018 PT Individual Time: [id number] PT Individual Time Calculation (min): 45 min   Short Term Goals: Week 1:  PT Short Term Goal 1 (Week 1): Patient will perform basic transfers with mod A of 1 person. PT Short Term Goal 2 (Week 1): Patient will ambulate 72' with CGA using LRAD. PT Short Term Goal 3 (Week 1): Patient will initiate stair training. PT Short Term Goal 4 (Week 1): Patient will perform dynamic standing balance with CGA.  Skilled Therapeutic Interventions/Progress Updates:     Patient in recliner with her daughter and interpreter in room upon PT arrival. Patient alert and agreeable to PT session and denied pain throughout session. Interpreter present and translated throughout session. Patient will knee brace donned on R LE at beginning of session. Patient daughter stated that she bought the brace at [facility] and the patient stated it gives her increased support when she tries to stand. Brace place down below patella. PT doffed and donned brace educating patient's daughter on proper placement. R knee brace donned throughout session.   Therapeutic Activity: Transfers: Patient performed sit to/from stand x2 using a RW with min A of 1 person, x3 using B rails at stairs with min A of 2 people, daughter assisting for patient comfort/managment of anxiety. Provided verbal cues for scooting forward in the chair and leaning forward to stand, then bring her hips forward in standing. She performed stand pivot x1 with min A and cues for hand placement on RW and sequencing.   Gait Training:  Patient ambulated 150 feet using RW with CGA. Ambulated with decreased gait speed, decreased step length and height, increased B knee flexion in stance,and downward head gaze.Provided verbal cues for looking ahead and increased step height and length. Patient performed step  taps at 3" steps x10 for pre-stair training with CGA for safety using B rails. She then went up/down 8 3" steps with CGA using B rails with cues for leading with he L LE when ascending and R LE when descending for safety due to decreased strength on R side. She then performed step up/down on 1 6" step with B rails x1 then x5 with min-mod A for physical support using the same technique described above.   Wheelchair Mobility:  Patient propelled wheelchair 150 feet with min A for steering as patient veers to the L. Provided heavy verbal cues and demonstration for propelling with B UEs together rather than reciprocally. Patient with great difficulty following cues.  Patient in recliner with her daughter and interpreter in the room at end of session with breaks locked, chair alarm set, and all needs within reach.    Therapy Documentation Precautions:  Precautions Precautions: Fall Restrictions Weight Bearing Restrictions: No    Therapy/Group: Individual Therapy  Hersh Minney L Christopherjohn Schiele PT, DPT   09/29/2018, 4:56 PM

## 2018-09-29 NOTE — Progress Notes (Signed)
[place] PHYSICAL MEDICINE & REHABILITATION PROGRESS NOTE   Subjective/Complaints:   Pt reports thigh muscles, in front, are tight- just the front- denies any pain.  Is a little sleepy. Also complains of trembles/shaking in limbs- NOT COLD.  Objective:   No results found. No results for input(s): WBC, HGB, HCT, PLT in the last 72 hours. No results for input(s): NA, K, CL, CO2, GLUCOSE, BUN, CREATININE, CALCIUM in the last 72 hours.  Intake/Output Summary (Last 24 hours) at 09/29/2018 1029 Last data filed at 09/29/2018 0816 Gross per 24 hour  Intake 700 ml  Output -  Net 700 ml     Physical Exam: Vital Signs Blood pressure 113/63, pulse 89, temperature 98.3 F (36.8 C), temperature source Oral, resp. rate 16, weight 68.6 kg, SpO2 99 %. Physical Exam  Vitals and labs reviewed. Constitutional: awake, alert, appropriate, spanish speaking, in chair next to bedside, NAD HENT:  Head: Normocephalic and atraumatic.  Eyes: EOM are normal.  Neck: No tracheal deviation present. No thyromegaly present.  CV- borderline tachycardia again today, regular rhythm Respiratory: CTA B/L GI: She exhibits no distension.  Musculoskeletal:     Comments: No edema or tenderness in extremities  Neurological: She is alert.  Patient is alert sitting up in chair.     Motor: Appears to be bilateral shoulder adduction 2+/5, elbow flexion/extension 4-/5, handgrip 4/5 Bilateral hip flexion 2/5, knee extension 2+/5, ankle dorsiflexion 4+/5- still very weak on exam Sensation diminished to light touch bilateral feet as well as in legs and hands this AM Skin: Skin is warm and dry.  Psychiatric:  Brighter affect     Assessment/Plan: 1. 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  Physiatrist is providing close team supervision and 24 hour management of active medical problems listed  below.  Physiatrist and rehab team continue to assess barriers to discharge/monitor patient progress toward functional and medical goals  Care Tool:  Bathing    Body parts bathed by patient: Right arm, Left arm, Chest, Abdomen, Right upper leg, Left upper leg, Front perineal area, Face, Right lower leg, Left lower leg   Body parts bathed by helper: Buttocks, Right lower leg, Left lower leg     Bathing assist Assist Level: Minimal Assistance - Patient > 75%     Upper Body Dressing/Undressing Upper body dressing   What is the patient wearing?: Pull over shirt    Upper body assist Assist Level: Supervision/Verbal cueing    Lower Body Dressing/Undressing Lower body dressing      What is the patient wearing?: Pants     Lower body assist Assist for lower body dressing: Moderate Assistance - Patient 50 - 74%     Toileting Toileting    Toileting assist Assist for toileting: Minimal Assistance - Patient > 75%     Transfers Chair/bed transfer  Transfers assist     Chair/bed transfer assist level: Moderate Assistance - Patient 50 - 74%     Locomotion Ambulation   Ambulation assist      Assist level: Contact Guard/Touching assist Assistive device: Walker-rolling Max distance: 267ft   Walk 10 feet activity   Assist     Assist level: Contact Guard/Touching assist Assistive device: Walker-rolling   Walk 50 feet activity   Assist    Assist level: Contact Guard/Touching assist Assistive device: Walker-rolling    Walk 150 feet activity   Assist Walk 150 feet activity did not occur:  Safety/medical concerns(decreased strength/activity tolerance)  Assist level: Contact Guard/Touching assist Assistive device: Walker-rolling    Walk 10 feet on uneven surface  activity   Assist Walk 10 feet on uneven surfaces activity did not occur: Safety/medical concerns(decreased strength/activity tolerance)         Wheelchair     Assist   Type of  Wheelchair: Manual    Wheelchair assist level: Set up assist, Minimal Assistance - Patient > 75% Max wheelchair distance: 188ft    Wheelchair 50 feet with 2 turns activity    Assist        Assist Level: Minimal Assistance - Patient > 75%, Set up assist   Wheelchair 150 feet activity     Assist   CBG (last 3)  Recent Labs    09/28/18 1702 09/28/18 2117 09/29/18 0618  GLUCAP 155* 139* 95        Assist Level: Set up assist, Minimal Assistance - Patient > 75%   Blood pressure 113/63, pulse 89, temperature 98.3 F (36.8 C), temperature source Oral, resp. rate 16, weight 68.6 kg, SpO2 99 %.  Medical Problem List and Plan: 1.  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  Current plan is prolonged course of Solu-Medrol 6 weeks followed by taper and follow-up rheumatology services Dr [name] at [facility].  9/10- has appt at [place] on 9/25- will d/c 9/24 after therapies Has telehealth interpretor in her room.  9/11- changed therapies to 15/7 vs 5/3 since think in dermatomyositis, pt can actually do worse if pushed too hard- also let therapy know.         CIR PT, OT  2.  Antithrombotics: -DVT/anticoagulation: Subcutaneous Lovenox.               Check vascular study             -antiplatelet therapy: N/A 3. Pain Management: Tylenol as needed  9/10- c/o mild HA- asked for tylenol 4. Mood: Provide emotional support             -antipsychotic agents: N/A 5. Neuropsych: This patient is?  Fully capable of making decisions on her own behalf. 6. Skin/Wound Care: Routine skin checks 7. Fluids/Electrolytes/Nutrition: Routine I/Os.  CMP ordered for tomorrow a.m. ; of note, no leukocytosis.  9/14- labs in process-- for today- will f/u when back  9/8- Ca low at 8.1; otherwise well BMP; ALT/AST somewhat elevated- will monitor 8.  Dysphagia.  Dysphagia #1 thin liquids.  Follow-up speech therapy.  Advance diet as  tolerated 9.  Steroid-induced hyperglycemia: Sliding scale insulin.  Monitor with taper  9/8- BGs 94-160s- will con't to monitor/SSI 10. Constipation  Received laxatives today awaiting result   9/14- LBM last night  11. Dispo   9/10- d/c date set for 10/09/2018 12.  Insomnia may be related to prednisone, add trazodone    LOS: 7 days A FACE TO FACE EVALUATION WAS PERFORMED  Kaileen Bronkema 09/29/2018, 10:29 AM

## 2018-09-29 NOTE — Progress Notes (Signed)
VAST paged. Returned call to lab and educated pt no longer has access to draw labs; labs need to be collected via venipuncture from lab.

## 2018-09-29 NOTE — Progress Notes (Signed)
Speech Language Pathology Discharge Summary  Patient Details  Name: Lisa Mcdowell MRN: [id number] Date of Birth: December 19, 1950  Today's Date: 09/29/2018 SLP Individual Time: 1131-1200 SLP Individual Time Calculation (min): 29 min   Skilled Therapeutic Interventions:  Pt was seen for skilled ST services focused on dysphagia. SLP facilitated session with skilled observation of pt consuming thin liquids, dysphagia 3 solids, as well as upgraded regular texture solid POs. Pt demonstrated efficient mastication, oral clearance, and use of recommended swallow strategies Mod I. No globus sensation reported and pt initiation swallow sequence in what appeared to be a timely manner per bedside observations. SLP educated pt and daughter regarding differences between D3 and Regular diets and pt's progress in dysphagia therapy since admission. Based on pt's presentation, recommend upgrade to regular texture diet, continue thin liquids, meds whole with water; pt agreeable to upgrade. Pt has met all ST goals and no further skilled ST is indicated at this time.    Patient has met 3 of 3 long term goals.  Patient to discharge at overall Modified Independent level.  Reasons goals not met: n/a   Clinical Impression/Discharge Summary:   Pt made functional gains and met 3 out of 3 long term goals this admission. Pt currently requires Mod I assist for use of swallow precautions and benefits from intermittent supervision. Pt is consuming regular diet with thin liquids. Pt has demonstrated improved initiation of swallow sequence and use of safe swallow strategies. No further skilled ST services will be needed upon discharge from hospital. Pt and family education is complete at this time.   Care Partner:  Caregiver Able to Provide Assistance: Yes     Recommendation:  Other (comment)(No ST follow up needs)      Equipment: none   Reasons for discharge: Treatment goals met   Patient/Family Agrees with Progress Made  and Goals Achieved: Yes    Arbutus Leas 09/29/2018, 12:57 PM

## 2018-09-30 ENCOUNTER — Inpatient Hospital Stay (HOSPITAL_COMMUNITY): Payer: Self-pay

## 2018-09-30 LAB — GLUCOSE, CAPILLARY
Glucose-Capillary: 107 mg/dL — ABNORMAL HIGH (ref 70–99)
Glucose-Capillary: 171 mg/dL — ABNORMAL HIGH (ref 70–99)
Glucose-Capillary: 145 mg/dL — ABNORMAL HIGH (ref 70–99)
Glucose-Capillary: 123 mg/dL — ABNORMAL HIGH (ref 70–99)

## 2018-09-30 MED ORDER — PANTOPRAZOLE SODIUM 40 MG PO TBEC
40.0000 mg | DELAYED_RELEASE_TABLET | Freq: Every day | ORAL | Status: DC
  Administered 2018-10-01 – 2018-10-06 (×6): 40 mg via ORAL
  Filled 2018-09-30 (×6): qty 1

## 2018-09-30 NOTE — Progress Notes (Signed)
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  Pt completed toileting tasks with min A and amb with RW back into room with min A.  Pt stood at sink to wash hands before returning to recliner.  Initial focus on sit<>stand and standing activities.  Pt declined shower this morning.  Pt engaged in BUE therex with 1kg ball, 2# weight, level 1 threaband, and 1# bar.  Exercises included rowing, straight arm lifts, chest presses, diagonals, bicpeps curls, trunk rotation, and shoulder flexion exercises-3X10. Pt required rest breaks between each exercise.  Therapy Documentation Precautions:  Precautions Precautions: Fall Restrictions Weight Bearing Restrictions: No   Pain: Pain Assessment Pain Scale: 0-10 Pain Score: 0-No pain   Therapy/Group: Individual Therapy  Leroy Libman 09/30/2018, 12:23 PM

## 2018-09-30 NOTE — Progress Notes (Signed)
Physical Therapy Weekly Progress Note  Patient Details  Name: Lisa Mcdowell MRN: [id number] Date of Birth: 11/22/1950  Beginning of progress report period: September 23, 2018 End of progress report period: October 01, 2018  Today's Date: 10/01/2018 PT Individual Time: 1300-1415 PT Individual Time Calculation (min): 75 min   Patient has met 4 of 4 short term goals.  She is progressing well towards her goals. She currently requires min-mod A for bed mobility, min A for transfers, CGA for gait up to 300 feet with RW, min A for 1-4" step with RW, and min A for wheelchair mobility. Discharged wheelchair mobility goals today due to patient expected to be a functional Ambulator at discharge.  Patient continues to demonstrate the following deficits muscle weakness, decreased cardiorespiratoy endurance, abnormal tone and decreased coordination and decreased sitting balance, decreased standing balance, decreased postural control and decreased balance strategies and therefore will continue to benefit from skilled PT intervention to increase functional independence with mobility.  Patient progressing toward long term goals..  Plan of care revisions: updated LTGs today due to patient's progress. Please see Care Plans for details..  PT Short Term Goals Week 1:  PT Short Term Goal 1 (Week 1): Patient will perform basic transfers with mod A of 1 person. PT Short Term Goal 1 - Progress (Week 1): Met PT Short Term Goal 2 (Week 1): Patient will ambulate 51' with CGA using LRAD. PT Short Term Goal 2 - Progress (Week 1): Met PT Short Term Goal 3 (Week 1): Patient will initiate stair training. PT Short Term Goal 3 - Progress (Week 1): Met PT Short Term Goal 4 (Week 1): Patient will perform dynamic standing balance with CGA. PT Short Term Goal 4 - Progress (Week 1): Met Week 2:  PT Short Term Goal 1 (Week 2): STG=LTG due to ELOS.  Skilled Therapeutic Interventions/Progress Updates:     Patient in recliner  with her daughter and interpreter in room upon PT arrival. Patient alert and agreeable to PT session and denied pain throughout session. Interpreter present and translated throughout session. Patient will knee brace donned on R LE at beginning of session.  Therapeutic Activity: Bed Mobility: Patient performed supine to/from sit with min-mod A for LE and trunk management on a mat table. Provided verbal cues for pushing through elbows rather than pulling up on her daughters arms to sit up. Transfers: Patient performed sit to/from stand x5 using a RW with min A of 1 person. Her daughter assisted x3 demonstrating proper cueing and body mechanics to assist patient safely. Provided verbal cues for scooting forward in the chair and leaning forward to stand, then bring her hips forward in standing. She performed stand pivot x1 with min A and cues for hand placement on RW and sequencing.   Gait Training:  Patient ambulated ~150 feet x2 and 300 feet x1 using RW with CGA. Ambulated with decreased gait speed, decreased step length and height on R, increased B knee flexion in stance,and downward head gaze.Provided verbal cues for looking ahead and increased step height and length on R. She went up 4-6" steps with min-mod A and down 8-3" steps with CGA using B rails with cues for leading with he L LE when ascending and R LE when descending for safety due to decreased strength on R side.  She also went up/down on 1 4" step to simulate home enterence using a RW x3 with CGA-min A for balance using the same technique described above. Patients daughter provided  assistance on third trial with min cues for safe guarding technique.   Therapeutic Exercise: Patient performed 1 set of the following exercises from HEP provided with handout during session with verbal and tactile cues for proper technique. Exercises  Supine Bridge - 5-10 reps - 3 sets - 1-3x daily - 7x weekly  Mini Squat with Counter Support - 10-15 reps - 3  sets - 1-3x daily - 7x weekly  Seated Piriformis Stretch - 3 sets - 30 sec-1 min hold - 1-3x daily - 7x weekly (to be added later, patient unable to perform at this time) Sit to Stand with Counter Support - 5-10 reps - 3 sets - 1-3x daily - 7x weekly  Patient Education  walking program  Educated on starting with lowest reps 1x per day and working up to increased reps and x per day as strength increases.   Patient in recliner with her daughter in the room at end of session with breaks locked, chair alarm set, and all needs within reach.    Therapy Documentation Precautions:  Precautions Precautions: Fall Restrictions Weight Bearing Restrictions: No   Therapy/Group: Individual Therapy  Ebert Forrester L Agapito Hanway PT, DPT  10/01/2018, 4:03 PM

## 2018-09-30 NOTE — Progress Notes (Signed)
Physical Therapy Session Note  Patient Details  Name: Lisa Mcdowell MRN: [id number] Date of Birth: 12-Dec-1950  Today's Date: 09/30/2018 PT Individual Time: [id number] PT Individual Time Calculation (min): 55 min   Short Term Goals: Week 1:  PT Short Term Goal 1 (Week 1): Patient will perform basic transfers with mod A of 1 person. PT Short Term Goal 2 (Week 1): Patient will ambulate 27' with CGA using LRAD. PT Short Term Goal 3 (Week 1): Patient will initiate stair training. PT Short Term Goal 4 (Week 1): Patient will perform dynamic standing balance with CGA.  Skilled Therapeutic Interventions/Progress Updates:    Session focused on functional gait training for functional mobility and general strengthening and endurance training on unit x 150' x 2 with CGA overall using RW for support. Dynamic gait through obstacle course for home environment simulation x 50' x 2 with turning and sidestepping (more cues required for technique for sidestepping). NMR for balance retraining on compliant surface while performing functional reaching task with 1 UE or no UE support with min assist for balance x 3 trials. Alternating toe taps to 4" step with BUE support x 20 reps total with seated rest break x 2 trials for balance and coordination training. Blocked practice sit <> stands with focus on technique and anterior weightshift and power up through LLE. Requires max verbal and demonstrational cues for carryover of technique.   Therapy Documentation Precautions:  Precautions Precautions: Fall Restrictions Weight Bearing Restrictions: No   Pain:  Denies pain.     Therapy/Group: Individual Therapy  Canary Brim Ivory Broad, PT, DPT, CBIS  09/30/2018, 2:57 PM

## 2018-09-30 NOTE — Plan of Care (Signed)
  Problem: Consults Goal: RH GENERAL PATIENT EDUCATION Description: See Patient Education module for education specifics. Outcome: Progressing   Problem: RH BLADDER ELIMINATION Goal: RH STG MANAGE BLADDER WITH ASSISTANCE Description: STG Manage Bladder With mod I Assistance Outcome: Progressing   Problem: RH SKIN INTEGRITY Goal: RH STG SKIN FREE OF INFECTION/BREAKDOWN Description: Min assist Outcome: Progressing Goal: RH STG MAINTAIN SKIN INTEGRITY WITH ASSISTANCE Description: STG Maintain Skin Integrity With min Assistance. Outcome: Progressing   Problem: RH SAFETY Goal: RH STG ADHERE TO SAFETY PRECAUTIONS W/ASSISTANCE/DEVICE Description: STG Adhere to Safety Precautions With cues/reminders Assistance/Device. Outcome: Progressing   Problem: RH PAIN MANAGEMENT Goal: RH STG PAIN MANAGED AT OR BELOW PT'S PAIN GOAL Description: At or below level 5 Outcome: Progressing   Problem: RH KNOWLEDGE DEFICIT GENERAL Goal: RH STG INCREASE KNOWLEDGE OF SELF CARE AFTER HOSPITALIZATION Description: Dtr will be able to manage care at discharge using handouts and educational materials independently Outcome: Progressing

## 2018-09-30 NOTE — Progress Notes (Signed)
[facility] PHYSICAL MEDICINE & REHABILITATION PROGRESS NOTE   Subjective/Complaints:   Pt reports slept better last night- eating well. LBM yesterday- no other issues/complaints.   ROS- pt denies CP, SOB, N/V/D.   Objective:   No results found. Recent Labs    09/29/18 0933  WBC 8.0  HGB 12.0  HCT 38.1  PLT 235   Recent Labs    09/29/18 0933  NA 139  K 3.5  CL 103  CO2 25  GLUCOSE 132*  BUN 17  CREATININE 0.56  CALCIUM 8.5*    Intake/Output Summary (Last 24 hours) at 09/30/2018 1025 Last data filed at 09/30/2018 0700 Gross per 24 hour  Intake 720 ml  Output -  Net 720 ml     Physical Exam: Vital Signs Blood pressure 126/67, pulse 89, temperature 98.3 F (36.8 C), temperature source Oral, resp. rate 18, weight 68.9 kg, SpO2 99 %. Physical Exam  Vitals and labs reviewed. Constitutional: awake, alert, appropriate, spanish speaking, in chair next to bedside still- bright affect,  Nurse at bedside, NAD HENT:  Head: Normocephalic and atraumatic.  Eyes: EOM are normal.  Neck: No tracheal deviation present. No thyromegaly present.  CV-RRR Respiratory: CTA B/L GI: She exhibits no distension. Soft, NT, (+)BS  Musculoskeletal:     Comments: No edema or tenderness in extremities  Neurological: She is alert.  Patient is alert sitting up in chair.     Motor: Appears to be bilateral shoulder adduction 2+/5, elbow flexion/extension 4-/5, handgrip 4/5 Bilateral hip flexion 2/5, knee extension 2+/5, ankle dorsiflexion 4+/5- still very weak on exam Sensation diminished to light touch bilateral feet as well as in legs and hands this AM Skin: Skin is warm and dry.  Psychiatric:  Brighter affect     Assessment/Plan: 1. 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  Physiatrist is providing close team supervision and 24 hour management of active medical  problems listed below.  Physiatrist and rehab team continue to assess barriers to discharge/monitor patient progress toward functional and medical goals  Care Tool:  Bathing    Body parts bathed by patient: Right arm, Left arm, Chest, Abdomen, Right upper leg, Left upper leg, Front perineal area, Face, Right lower leg, Left lower leg   Body parts bathed by helper: Buttocks, Right lower leg, Left lower leg     Bathing assist Assist Level: Minimal Assistance - Patient > 75%     Upper Body Dressing/Undressing Upper body dressing   What is the patient wearing?: Pull over shirt    Upper body assist Assist Level: Supervision/Verbal cueing    Lower Body Dressing/Undressing Lower body dressing      What is the patient wearing?: Pants     Lower body assist Assist for lower body dressing: Moderate Assistance - Patient 50 - 74%     Toileting Toileting    Toileting assist Assist for toileting: Minimal Assistance - Patient > 75%     Transfers Chair/bed transfer  Transfers assist     Chair/bed transfer assist level: Minimal Assistance - Patient > 75%     Locomotion Ambulation   Ambulation assist      Assist level: Contact Guard/Touching assist Assistive device: Walker-rolling Max distance: 150'   Walk 10 feet activity   Assist     Assist level: Contact Guard/Touching assist Assistive device: Walker-rolling   Walk 50 feet activity   Assist    Assist level: Contact  Guard/Touching assist Assistive device: Walker-rolling    Walk 150 feet activity   Assist Walk 150 feet activity did not occur: Safety/medical concerns(decreased strength/activity tolerance)  Assist level: Contact Guard/Touching assist Assistive device: Walker-rolling    Walk 10 feet on uneven surface  activity   Assist Walk 10 feet on uneven surfaces activity did not occur: Safety/medical concerns(decreased strength/activity tolerance)         Wheelchair     Assist    Type of Wheelchair: Manual    Wheelchair assist level: Set up assist, Minimal Assistance - Patient > 75% Max wheelchair distance: 150'    Wheelchair 50 feet with 2 turns activity    Assist        Assist Level: Set up assist, Minimal Assistance - Patient > 75%   Wheelchair 150 feet activity     Assist   CBG (last 3)  Recent Labs    09/29/18 1716 09/29/18 2113 09/30/18 0637  GLUCAP 167* 173* 107*        Assist Level: Set up assist, Minimal Assistance - Patient > 75%   Blood pressure 126/67, pulse 89, temperature 98.3 F (36.8 C), temperature source Oral, resp. rate 18, weight 68.9 kg, SpO2 99 %.  Medical Problem List and Plan: 1.  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  Current plan is prolonged course of Solu-Medrol 6 weeks followed by taper and follow-up rheumatology services Dr [name] at [facility].  9/10- has appt at [place] on 9/25- will d/c 9/24 after therapies Has telehealth interpretor in her room.  9/11- changed therapies to 15/7 vs 5/3 since think in dermatomyositis, pt can actually do worse if pushed too hard- also let therapy know.         CIR PT, OT  2.  Antithrombotics: -DVT/anticoagulation: Subcutaneous Lovenox.               Check vascular study             -antiplatelet therapy: N/A 3. Pain Management: Tylenol as needed  9/10- c/o mild HA- asked for tylenol 4. Mood: Provide emotional support             -antipsychotic agents: N/A 5. Neuropsych: This patient is?  Fully capable of making decisions on her own behalf. 6. Skin/Wound Care: Routine skin checks 7. Fluids/Electrolytes/Nutrition: Routine I/Os.  CMP ordered for tomorrow a.m. ; of note, no leukocytosis.  9/8- Ca low at 8.1; otherwise well BMP; ALT/AST somewhat elevated- will monitor  9/15- AST/ALT slightly higher- will recheck Thursday if still going up, will d/w pharmacy   8.  Dysphagia.  Dysphagia #1 thin liquids.   Follow-up speech therapy.  Advance diet as tolerated  9/15- can now take pills whole- change protonix to pill form 9.  Steroid-induced hyperglycemia: Sliding scale insulin.  Monitor with taper  9/8- BGs 94-160s- will con't to monitor/SSI 10. Constipation  Received laxatives today awaiting result   9/14- LBM last night  9/15- having regular BMs now on miralax  11. Dispo   9/10- d/c date set for 10/09/2018 12.  Insomnia may be related to prednisone, add trazodone    LOS: 8 days A FACE TO FACE EVALUATION WAS PERFORMED  Rayson Rando 09/30/2018, 10:25 AM

## 2018-10-01 ENCOUNTER — Inpatient Hospital Stay (HOSPITAL_COMMUNITY): Payer: Self-pay

## 2018-10-01 LAB — GLUCOSE, CAPILLARY
Glucose-Capillary: 90 mg/dL (ref 70–99)
Glucose-Capillary: 160 mg/dL — ABNORMAL HIGH (ref 70–99)
Glucose-Capillary: 161 mg/dL — ABNORMAL HIGH (ref 70–99)
Glucose-Capillary: 134 mg/dL — ABNORMAL HIGH (ref 70–99)

## 2018-10-01 MED ORDER — CYCLOBENZAPRINE HCL 5 MG PO TABS
5.0000 mg | ORAL_TABLET | Freq: Three times a day (TID) | ORAL | Status: DC | PRN
  Administered 2018-10-01 – 2018-10-02 (×2): 5 mg via ORAL
  Filled 2018-10-01 (×2): qty 1

## 2018-10-01 NOTE — Progress Notes (Signed)
Social Work Patient ID: Lisa Mcdowell, female   DOB: 1950-09-03, 68 y.o.   MRN: [id number]  Met with pt and daughter to discuss team conference progressing toward her goals and being able to move up discharge date 9/21, due to meeting her goals. Both very happy she can go home next Monday and the progress she has made while here.

## 2018-10-01 NOTE — Progress Notes (Signed)
[facility] PHYSICAL MEDICINE & REHABILITATION PROGRESS NOTE   Subjective/Complaints:   Pt reports still having heaviness of legs and feet- explained that's normal since feels weak/is weak in legs.  Also c/o tightness in LEs- wondering if that can be treated.  LBM today/this AM.  ROS- pt denies CP, SOB, N/V/D.   Objective:   No results found. Recent Labs    09/29/18 0933  WBC 8.0  HGB 12.0  HCT 38.1  PLT 235   Recent Labs    09/29/18 0933  NA 139  K 3.5  CL 103  CO2 25  GLUCOSE 132*  BUN 17  CREATININE 0.56  CALCIUM 8.5*    Intake/Output Summary (Last 24 hours) at 10/01/2018 0924 Last data filed at 10/01/2018 0700 Gross per 24 hour  Intake 720 ml  Output -  Net 720 ml     Physical Exam: Vital Signs Blood pressure 124/64, pulse 92, temperature 98.7 F (37.1 C), temperature source Oral, resp. rate 18, weight 64.9 kg, SpO2 100 %. Physical Exam  Vitals and labs reviewed. Constitutional: awake, alert, appropriate, spanish speaking, in chair next to bedside still- every day- bright affect,  Used tele-interpretor to speak to pt, NAD HENT:  Head: Normocephalic and atraumatic.  Eyes: EOM are normal.  Neck: No tracheal deviation present. No thyromegaly present.  CV-RRR Respiratory: CTA B/L GI: She exhibits no distension. Soft, NT, (+)BS  Musculoskeletal:     Comments: No edema or tenderness in extremities;  tight thigh muscles and IT band B/L Neurological: She is alert.  Patient is alert sitting up in chair.     Motor: Appears to be bilateral shoulder adduction 2+/5, elbow flexion/extension 4-/5, handgrip 4/5 Bilateral hip flexion 2/5, knee extension 2+/5, ankle dorsiflexion 4+/5- still very weak on exam Sensation diminished to light touch bilateral feet as well as in legs and hands this AM Skin: Skin is warm and dry.  Psychiatric:  Brighter affect     Assessment/Plan: 1. 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  Physiatrist is providing close team supervision and 24 hour management of active medical problems listed below.  Physiatrist and rehab team continue to assess barriers to discharge/monitor patient progress toward functional and medical goals  Care Tool:  Bathing    Body parts bathed by patient: Right arm, Left arm, Chest, Abdomen, Right upper leg, Left upper leg, Front perineal area, Face, Right lower leg, Left lower leg   Body parts bathed by helper: Buttocks, Right lower leg, Left lower leg     Bathing assist Assist Level: Minimal Assistance - Patient > 75%     Upper Body Dressing/Undressing Upper body dressing   What is the patient wearing?: Pull over shirt    Upper body assist Assist Level: Supervision/Verbal cueing    Lower Body Dressing/Undressing Lower body dressing      What is the patient wearing?: Pants     Lower body assist Assist for lower body dressing: Moderate Assistance - Patient 50 - 74%     Toileting Toileting    Toileting assist Assist for toileting: Minimal Assistance - Patient > 75%     Transfers Chair/bed transfer  Transfers assist     Chair/bed transfer assist level: Contact Guard/Touching assist     Locomotion Ambulation   Ambulation assist      Assist level: Contact Guard/Touching assist Assistive device: Walker-rolling Max distance: 150'   Walk 10 feet activity  Assist     Assist level: Contact Guard/Touching assist Assistive device: Walker-rolling   Walk 50 feet activity   Assist    Assist level: Contact Guard/Touching assist Assistive device: Walker-rolling    Walk 150 feet activity   Assist Walk 150 feet activity did not occur: Safety/medical concerns(decreased strength/activity tolerance)  Assist level: Contact Guard/Touching assist Assistive device: Walker-rolling    Walk 10 feet on uneven surface  activity   Assist Walk  10 feet on uneven surfaces activity did not occur: Safety/medical concerns(decreased strength/activity tolerance)         Wheelchair     Assist   Type of Wheelchair: Manual    Wheelchair assist level: Set up assist, Minimal Assistance - Patient > 75% Max wheelchair distance: 150'    Wheelchair 50 feet with 2 turns activity    Assist        Assist Level: Set up assist, Minimal Assistance - Patient > 75%   Wheelchair 150 feet activity     Assist   CBG (last 3)  Recent Labs    09/30/18 1725 09/30/18 2107 10/01/18 0652  GLUCAP 145* 123* 90        Assist Level: Set up assist, Minimal Assistance - Patient > 75%   Blood pressure 124/64, pulse 92, temperature 98.7 F (37.1 C), temperature source Oral, resp. rate 18, weight 64.9 kg, SpO2 100 %.  Medical Problem List and Plan: 1.  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  Current plan is prolonged course of Solu-Medrol 6 weeks followed by taper and follow-up rheumatology services Dr [name] at [facility].  9/10- has appt at [place] on 9/25- will d/c 9/24 after therapies Has telehealth interpretor in her room.  9/11- changed therapies to 15/7 vs 5/3 since think in dermatomyositis, pt can actually do worse if pushed too hard- also let therapy know.  9/16- will add Flexeril 5 mg TID prn for muscle tightness; explained weakness/heaviness is normal         CIR PT, OT  2.  Antithrombotics: -DVT/anticoagulation: Subcutaneous Lovenox.               Check vascular study             -antiplatelet therapy: N/A 3. Pain Management: Tylenol as needed  9/10- c/o mild HA- asked for tylenol 4. Mood: Provide emotional support             -antipsychotic agents: N/A 5. Neuropsych: This patient is?  Fully capable of making decisions on her own behalf. 6. Skin/Wound Care: Routine skin checks 7. Fluids/Electrolytes/Nutrition: Routine I/Os.  CMP ordered for tomorrow  a.m. ; of note, no leukocytosis.  9/8- Ca low at 8.1; otherwise well BMP; ALT/AST somewhat elevated- will monitor  9/15- AST/ALT slightly higher- will recheck Thursday if still going up, will d/w pharmacy   8.  Dysphagia.  Dysphagia #1 thin liquids.  Follow-up speech therapy.  Advance diet as tolerated  9/15- can now take pills whole- change protonix to pill form 9.  Steroid-induced hyperglycemia: Sliding scale insulin.  Monitor with taper  9/8- BGs 94-160s- will con't to monitor/SSI 10. Constipation  Received laxatives today awaiting result   9/14- LBM last night  9/15- having regular BMs now on miralax  11. Dispo   9/10- d/c date set for 10/09/2018 12.  Insomnia may be related to prednisone, add trazodone  9/16- helping    LOS: 9 days A FACE TO FACE EVALUATION WAS PERFORMED  Lisa Mcdowell 10/01/2018,  9:24 AM

## 2018-10-01 NOTE — Patient Care Conference (Signed)
Inpatient RehabilitationTeam Conference and Plan of Care Update Date: 10/01/2018   Time: 9:50 AM    Patient Name: Lisa Mcdowell      Medical Record Number: [id number]  Date of Birth: 08/10/1950 Sex: Female         Room/Bed: 4M06C/4M06C-01 Payor Info: Payor: MEDICAID POTENTIAL / Plan: MEDICAID POTENTIAL / Product Type: *No Product type* /    Admit Date/Time:  09/22/2018  1:18 PM  Primary Diagnosis:  Dermatomyositis ([place])  Patient Active Problem List   Diagnosis Date Noted  . 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 Pressure injury of skin 09/17/2018  . Thrombocytopenia ([place]) 09/06/2018  . Transaminitis 08/26/2018  . Elevated liver enzymes   . Dysphagia 08/25/2018  . 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 Encounter for screening colonoscopy 03/24/2015  . Essential tremor 02/06/2015  . Hyperlipidemia 10/28/2014  . GERD (gastroesophageal reflux disease) 10/28/2014  . Hemorrhoids 10/28/2014  . Gallstones 08/08/2010    Expected Discharge Date: Expected Discharge Date: 10/06/18  Team Members Present: Physician leading conference: Dr. Courtney Heys Social Worker Present: Ovidio Kin, LCSW Nurse Present: Rayetta Pigg, RN PT Present: Apolinar Junes, PT OT Present: Willeen Cass, OT;Roanna Epley, COTA SLP Present: Weston Anna, SLP PPS Coordinator present : Gunnar Fusi, SLP     Current Status/Progress Goal Weekly Team Focus  Bowel/Bladder   Continent of bowel and bladder; LBM 9/15  Continent of B/B with mod I assist  Assess and treat for constipation as needed   Swallow/Nutrition/ Hydration   min A bed mobility, mod-min A for transfers with RW, CGA gait x200' with RW and w/c follow, mod 1 step with B rails, min A w/c mobility  Min A bed mobility, transfers, 1 step w/o rails and 4 steps with B rails, CGA gait 150'  functional mobility, transfers, strengthening, balance, activity tolerance, patient/family education   ADL's   bathing-min A; UB dressing-supervision; LB dressing-mod A; toileting-min A; functional transfers-min A  min A overall      Mobility   min A bed mobility, mod-min A for transfers with RW, CGA gait x200' with RW and w/c follow, min A w/c mobility, mod 1 step with B rails  min A bed mobility, transfers, 1 step w/o rails and 4 steps with B rails, CGA gait 150'  functional mobility, transfers, strengthening, balance, activity tolerance, patient/family education   Communication             Safety/Cognition/ Behavioral Observations            Pain   Denies pain  <3  Assess and treat for pain q shift and prn   Skin   Stage II on sacrum- dressing changes every 3 days.  Bruising noted to abdomen  Maintain skin integrity with min assist  Assess skin q shift and prn      *See Care Plan and progress notes for long and short-term goals.     Barriers to Discharge  Current Status/Progress Possible Resolutions Date Resolved   Nursing                  PT  Home environment access/layout  1 STE no rails              OT                  SLP                SW  Discharge Planning/Teaching Needs:  Daughter has been here daily and participates in Mom's therapies. Pt feels more comfortable with her here. Making good progress in her therapies.      Team Discussion:  Pt making good progress toward her goals and almost at goal level. Speech discharged due to reached goals. Currently min-CGA level. Muscle tightness today and MD increased flexaril. Daughter here daily and participates in therapies. MD drawing labs tomorrow. Able to move up discharge date to 9/21 due to progress.  Revisions to Treatment Plan:  DC 9/21    Medical Summary Current Status: muscle tightness of LEs; dermatomyositis; spanish speaking Weekly Focus/Goal: communication- using tele interpretor  Barriers to Discharge: Home enviroment access/layout;Medical stability;Medication compliance;Weight  Barriers to Discharge  Comments: n/a Possible Resolutions to Barriers: improve discomfort from tightness   Continued Need for Acute Rehabilitation Level of Care: The patient requires daily medical management by a physician with specialized training in physical medicine and rehabilitation for the following reasons: Direction of a multidisciplinary physical rehabilitation program to maximize functional independence : Yes Medical management of patient stability for increased activity during participation in an intensive rehabilitation regime.: Yes Analysis of laboratory values and/or radiology reports with any subsequent need for medication adjustment and/or medical intervention. : Yes   I attest that I was present, lead the team conference, and concur with the assessment and plan of the team. Teleconference held due to COVID 19   Elianne Gubser, [place] Rhyme 10/01/2018, 1:46 PM

## 2018-10-01 NOTE — Plan of Care (Signed)
  Problem: RH Wheelchair Mobility Goal: LTG Patient will propel w/c in controlled environment (PT) Description: LTG: Patient will propel wheelchair in controlled environment, # of feet with assist (PT) Outcome: Not Applicable Flowsheets (Taken 10/01/2018 1606) LTG: Pt will propel w/c in controlled environ  assist needed:: (Discharged goal) -- Note: Discharged goal due to patient progressing toward being a functional ambulator at d/c. Goal: LTG Patient will propel w/c in home environment (PT) Description: LTG: Patient will propel wheelchair in home environment, # of feet with assistance (PT). Outcome: Not Applicable Flowsheets (Taken 10/01/2018 1606) LTG: Pt will propel w/c in home environ  assist needed:: (Discharged goal) -- Note: Discharged goal due to patient progressing toward being a functional ambulator at d/c.   Problem: RH Stairs Goal: LTG Patient will ambulate up and down stairs w/assist (PT) Description: LTG: Patient will ambulate up and down # of stairs with assistance (PT) Flowsheets Taken 10/01/2018 1606 LTG: Pt will  ambulate up and down number of stairs: (Changed goal) 1 4" step without rails using LRAD to simulate home set up Taken 09/23/2018 1622 LTG: Pt will ambulate up/down stairs assist needed:: Minimal Assistance - Patient > 75% Note: Removed 4 steps with B rails due to slow progress and patient will not need to perform this activity at d/c.

## 2018-10-01 NOTE — Plan of Care (Signed)
  Problem: Consults Goal: RH GENERAL PATIENT EDUCATION Description: See Patient Education module for education specifics. Outcome: Progressing   Problem: RH BLADDER ELIMINATION Goal: RH STG MANAGE BLADDER WITH ASSISTANCE Description: STG Manage Bladder With mod I Assistance Outcome: Progressing   Problem: RH SKIN INTEGRITY Goal: RH STG SKIN FREE OF INFECTION/BREAKDOWN Description: Min assist Outcome: Progressing Goal: RH STG MAINTAIN SKIN INTEGRITY WITH ASSISTANCE Description: STG Maintain Skin Integrity With min Assistance. Outcome: Progressing   Problem: RH SAFETY Goal: RH STG ADHERE TO SAFETY PRECAUTIONS W/ASSISTANCE/DEVICE Description: STG Adhere to Safety Precautions With cues/reminders Assistance/Device. Outcome: Progressing   Problem: RH PAIN MANAGEMENT Goal: RH STG PAIN MANAGED AT OR BELOW PT'S PAIN GOAL Description: At or below level 5 Outcome: Progressing   Problem: RH KNOWLEDGE DEFICIT GENERAL Goal: RH STG INCREASE KNOWLEDGE OF SELF CARE AFTER HOSPITALIZATION Description: Dtr will be able to manage care at discharge using handouts and educational materials independently Outcome: Progressing

## 2018-10-01 NOTE — Progress Notes (Signed)
Occupational Therapy Session Note  Patient Details  Name: Lisa Mcdowell MRN: [id number] Date of Birth: [birth year]  Today's Date: 10/01/2018 OT Individual Time: 0900-1000 OT Individual Time Calculation (min): 60 min    Short Term Goals: Week 2:  OT Short Term Goal 1 (Week 2): STG=LTG secondary to ELOS  Skilled Therapeutic Interventions/Progress Updates:    Pt resting in recliner upon arrival with interpreter present. Pt requested shower this morning.  Sit<>stand from recliner and amb with RW to bathroom with CGA.  Pt completed bathing tasks seated on TTB with assistance for buttocks.  Pt required mod A for sit<>stand from TTB at low height. Pt required assitance threading BLE into pants this morninog.  Pants were tight fitting and pt required assistance pulling over hip.  Pt amb back to recliner with CGA.  Pt engaged in BUE therex with 1# bar, 1kg ball, and 2# weight. Rowing, chest presses, diagonals, biceps curls, and trunk rotation holding ball at chest level 3X10 with rest breaks.  Pt remained seated in recliner with seat alarm activated and daughter present.   Therapy Documentation Precautions:  Precautions Precautions: Fall Restrictions Weight Bearing Restrictions: No    Pain: Pain Assessment Pain Scale: 0-10 Pain Score: 0-No pain   Therapy/Group: Individual Therapy  Leroy Libman 10/01/2018, 10:26 AM

## 2018-10-02 ENCOUNTER — Inpatient Hospital Stay (HOSPITAL_COMMUNITY): Payer: Self-pay | Admitting: Occupational Therapy

## 2018-10-02 ENCOUNTER — Inpatient Hospital Stay (HOSPITAL_COMMUNITY): Payer: Self-pay

## 2018-10-02 DIAGNOSIS — G479 Sleep disorder, unspecified: Secondary | ICD-10-CM

## 2018-10-02 DIAGNOSIS — E876 Hypokalemia: Secondary | ICD-10-CM

## 2018-10-02 DIAGNOSIS — R740 Nonspecific elevation of levels of transaminase and lactic acid dehydrogenase [LDH]: Secondary | ICD-10-CM

## 2018-10-02 DIAGNOSIS — R739 Hyperglycemia, unspecified: Secondary | ICD-10-CM

## 2018-10-02 DIAGNOSIS — T380X5A Adverse effect of glucocorticoids and synthetic analogues, initial encounter: Secondary | ICD-10-CM

## 2018-10-02 DIAGNOSIS — R1312 Dysphagia, oropharyngeal phase: Secondary | ICD-10-CM

## 2018-10-02 DIAGNOSIS — D62 Acute posthemorrhagic anemia: Secondary | ICD-10-CM

## 2018-10-02 DIAGNOSIS — R7309 Other abnormal glucose: Secondary | ICD-10-CM

## 2018-10-02 LAB — COMPREHENSIVE METABOLIC PANEL
ALT: 83 U/L — ABNORMAL HIGH (ref 0–44)
AST: 70 U/L — ABNORMAL HIGH (ref 15–41)
Albumin: 3 g/dL — ABNORMAL LOW (ref 3.5–5.0)
Alkaline Phosphatase: 49 U/L (ref 38–126)
Anion gap: 10 (ref 5–15)
BUN: 16 mg/dL (ref 8–23)
CO2: 23 mmol/L (ref 22–32)
Calcium: 8.6 mg/dL — ABNORMAL LOW (ref 8.9–10.3)
Chloride: 105 mmol/L (ref 98–111)
Creatinine, Ser: 0.46 mg/dL (ref 0.44–1.00)
GFR calc Af Amer: >60 mL/min (ref >60)
GFR calc non Af Amer: >60 mL/min (ref >60)
Glucose, Bld: 109 mg/dL — ABNORMAL HIGH (ref 70–99)
Potassium: 3.3 mmol/L — ABNORMAL LOW (ref 3.5–5.1)
Sodium: 138 mmol/L (ref 135–145)
Total Bilirubin: 0.7 mg/dL (ref 0.3–1.2)
Total Protein: 6.4 g/dL — ABNORMAL LOW (ref 6.5–8.1)

## 2018-10-02 LAB — CBC WITH DIFFERENTIAL/PLATELET
Abs Immature Granulocytes: 0 10*3/uL (ref 0.00–0.07)
Basophils Absolute: 0 10*3/uL (ref 0.0–0.1)
Basophils Relative: 1 %
Eosinophils Absolute: 0.1 10*3/uL (ref 0.0–0.5)
Eosinophils Relative: 3 %
HCT: 36.1 % (ref 36.0–46.0)
Hemoglobin: 11.7 g/dL — ABNORMAL LOW (ref 12.0–15.0)
Lymphocytes Relative: 33 %
Lymphs Abs: 1.6 10*3/uL (ref 0.7–4.0)
MCH: 34.4 pg — ABNORMAL HIGH (ref 26.0–34.0)
MCHC: 32.4 g/dL (ref 30.0–36.0)
MCV: 106.2 fL — ABNORMAL HIGH (ref 80.0–100.0)
Monocytes Absolute: 0.2 10*3/uL (ref 0.1–1.0)
Monocytes Relative: 5 %
Neutro Abs: 2.8 10*3/uL (ref 1.7–7.7)
Neutrophils Relative %: 58 %
Platelets: 212 10*3/uL (ref 150–400)
RBC: 3.4 MIL/uL — ABNORMAL LOW (ref 3.87–5.11)
RDW: 17.4 % — ABNORMAL HIGH (ref 11.5–15.5)
WBC: 4.8 10*3/uL (ref 4.0–10.5)
nRBC: 0 /100 WBC
nRBC: 0.4 % — ABNORMAL HIGH (ref 0.0–0.2)

## 2018-10-02 LAB — GLUCOSE, CAPILLARY
Glucose-Capillary: 94 mg/dL (ref 70–99)
Glucose-Capillary: 180 mg/dL — ABNORMAL HIGH (ref 70–99)
Glucose-Capillary: 165 mg/dL — ABNORMAL HIGH (ref 70–99)
Glucose-Capillary: 104 mg/dL — ABNORMAL HIGH (ref 70–99)

## 2018-10-02 MED ORDER — POTASSIUM CHLORIDE CRYS ER 20 MEQ PO TBCR
20.0000 meq | EXTENDED_RELEASE_TABLET | Freq: Two times a day (BID) | ORAL | Status: AC
  Administered 2018-10-02 – 2018-10-03 (×4): 20 meq via ORAL
  Filled 2018-10-02 (×4): qty 1

## 2018-10-02 NOTE — Progress Notes (Signed)
Occupational Therapy Session Note  Patient Details  Name: Lisa Mcdowell MRN: [id number] Date of Birth: March 04, 1950  Today's Date: 10/02/2018 OT Individual Time: 1300-1400 OT Individual Time Calculation (min): 60 min    Short Term Goals: Week 2:  OT Short Term Goal 1 (Week 2): STG=LTG secondary to ELOS  Skilled Therapeutic Interventions/Progress Updates:    Treatment session with focus on activity tolerance, dynamic standing balance, and BUE strength and ROM.  Pt received upright in recliner finishing lunch.  Ambulated 150' to therapy gym with RW and CGA.  Engaged in sit > stand from therapy mat with pt requiring min-mod assist depending on fatigue.  Engaged in functional reaching in standing, incorporating reaching overhead and trunk rotation to simulate home management tasks while challenging endurance.  Engaged in [place] in sitting with use of hula hoop with hands at 9 and 3 rotating Rt and Lt and then lifting overhead to challenge shoulder flexion.  Ambulated back to room with RW with CGA. Pt able to complete sit > stand from reliner at end of session with supervision.  Pt remained upright in recliner with chair alarm on and all needs in reach.  Therapy Documentation Precautions:  Precautions Precautions: Fall Restrictions Weight Bearing Restrictions: No General:   Vital Signs: Therapy Vitals Temp: 97.8 F (36.6 C) Temp Source: Oral Pulse Rate: (!) 101 Resp: 16 BP: 128/77 Patient Position (if appropriate): Sitting Oxygen Therapy SpO2: 100 % O2 Device: Room Air Pain:  Pt with no c/o pain   Therapy/Group: Individual Therapy  Simonne Come 10/02/2018, 2:54 PM

## 2018-10-02 NOTE — Progress Notes (Addendum)
Physical Therapy Session Note  Patient Details  Name: Lisa Mcdowell MRN: [id number] Date of Birth: July 08, 1950  Today's Date: 10/02/2018 PT Individual Time: 0859-1000 PT Individual Time Calculation (min): 61 min   Short Term Goals: Week 1:  PT Short Term Goal 1 (Week 1): Patient will perform basic transfers with mod A of 1 person. PT Short Term Goal 1 - Progress (Week 1): Met PT Short Term Goal 2 (Week 1): Patient will ambulate 48' with CGA using LRAD. PT Short Term Goal 2 - Progress (Week 1): Met PT Short Term Goal 3 (Week 1): Patient will initiate stair training. PT Short Term Goal 3 - Progress (Week 1): Met PT Short Term Goal 4 (Week 1): Patient will perform dynamic standing balance with CGA. PT Short Term Goal 4 - Progress (Week 1): Met Week 2:  PT Short Term Goal 1 (Week 2): STG=LTG due to ELOS. Week 3:     Skilled Therapeutic Interventions/Progress Updates:  Pain  Pt denies pain this am     Pt initially OOB in recliner. Pt sit to stand w/mod assist of 1. Recliner to wc via stand pivot w/mod assist.  Interpreter arrived at this time to assist w/session.  Dressing: Therapist assisted pt w/donning pants to knees in sitting.  Sit to stand w/mod assist of 1.  Stood w/cga while therapist completed raising pants. Pt able to donn shirt while standing w/cga for balance, min assist to pull shirt down in back. Stand to sit w/min assist to control descent. Therapist applied tedhose and socks for pt.  Gait 277f x 2 w/cga, verbal cues for upright gaze/posture..  Decreased step length and decreased clearance bilat, increased knee flexion thru stance.    Stairs:  Ascends and descends 3inch stairs w/2rails w/cga and verbal cues for sequencing. Ascends/descends 4 6inch stairs w/2 rails w/mod assist and verbal cues for sequencing Repeated above stair training x 2 for general strengthening and recall.  Sit to stand from wc 4x w/mod assist and verbal cues for sequencing, ant wt  shift.  Seated therex: AAROM hip flexion 2 x 5reps Isometric hip adduction w/towel 2x5 LAQ's w/isometric hold at end range 2x5  Discussed w/pt and daughter therex and reason for limited reps/sets.    Pt w/poor recall for safe sequencing on stairs.  Requested to ambulate without assistive device at end of session/questionable insight to deficits.    Therapy Documentation Precautions:  Precautions Precautions: Fall Restrictions Weight Bearing Restrictions: No    Therapy/Group: Individual Therapy  BCallie Fielding PMeadow9/17/2020, 11:12 AM

## 2018-10-02 NOTE — Progress Notes (Signed)
Lisa Mcdowell PHYSICAL MEDICINE & REHABILITATION PROGRESS NOTE   Subjective/Complaints: Patient seen sitting up in a chair this morning.  She states she slept well overnight.  Video interpreter utilized, patient complains of leg heaviness and has questions regarding prognosis.  ROS- Denies CP, SOB, N/V/D  Objective:   No results found. Recent Labs    10/02/18 0707  WBC 4.8  HGB 11.7*  HCT 36.1  PLT 212   Recent Labs    10/02/18 0707  NA 138  K 3.3*  CL 105  CO2 23  GLUCOSE 109*  BUN 16  CREATININE 0.46  CALCIUM 8.6*    Intake/Output Summary (Last 24 hours) at 10/02/2018 1042 Last data filed at 10/01/2018 1300 Gross per 24 hour  Intake 120 ml  Output -  Net 120 ml     Physical Exam: Vital Signs Blood pressure 130/68, pulse 92, temperature 98.1 F (36.7 C), temperature source Oral, resp. rate 16, weight 64 kg, SpO2 97 %. Constitutional: No distress . Vital signs reviewed. HENT: Normocephalic.  Atraumatic. Eyes: EOMI. No discharge. Cardiovascular: No JVD. Respiratory: Normal effort.  No stridor. GI: Non-distended. Skin: Warm and dry.  Intact. Psych: Normal mood.  Normal behavior. Musc: No edema in extremities.  No tenderness in extremities. Neurological: Alert Motor: Appears to be bilateral shoulder adduction 4-/5, elbow flexion/extension 4-/5, handgrip 4/5 Bilateral hip flexion 2/5, knee extension 2+/5, ankle dorsiflexion 4+/5, unchanged  Assessment/Plan: 1. 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  Physiatrist is providing close team supervision and 24 hour management of active medical problems listed below.  Physiatrist and rehab team continue to assess barriers to discharge/monitor patient progress toward functional and medical goals  Care Tool:  Bathing    Body parts bathed by patient: Right arm, Left arm, Chest, Abdomen, Right upper leg,  Left upper leg, Front perineal area, Face, Right lower leg, Left lower leg   Body parts bathed by helper: Buttocks, Right lower leg, Left lower leg     Bathing assist Assist Level: Minimal Assistance - Patient > 75%     Upper Body Dressing/Undressing Upper body dressing   What is the patient wearing?: Pull over shirt    Upper body assist Assist Level: Supervision/Verbal cueing    Lower Body Dressing/Undressing Lower body dressing      What is the patient wearing?: Pants     Lower body assist Assist for lower body dressing: Moderate Assistance - Patient 50 - 74%     Toileting Toileting    Toileting assist Assist for toileting: Minimal Assistance - Patient > 75%     Transfers Chair/bed transfer  Transfers assist     Chair/bed transfer assist level: Minimal Assistance - Patient > 75%     Locomotion Ambulation   Ambulation assist      Assist level: Contact Guard/Touching assist Assistive device: Walker-rolling Max distance: 300'   Walk 10 feet activity   Assist     Assist level: Contact Guard/Touching assist Assistive device: Walker-rolling   Walk 50 feet activity   Assist    Assist level: Contact Guard/Touching assist Assistive device: Walker-rolling    Walk 150 feet activity   Assist Walk 150 feet activity did not occur: Safety/medical concerns(decreased strength/activity tolerance)  Assist level: Contact Guard/Touching assist Assistive device: Walker-rolling    Walk 10 feet on uneven surface  activity   Assist Walk 10 feet on uneven surfaces activity did not occur: Safety/medical concerns(decreased  strength/activity tolerance)         Wheelchair     Assist   Type of Wheelchair: Manual    Wheelchair assist level: Set up assist, Minimal Assistance - Patient > 75% Max wheelchair distance: 150'    Wheelchair 50 feet with 2 turns activity    Assist        Assist Level: Set up assist, Minimal Assistance - Patient  > 75%   Wheelchair 150 feet activity     Assist   CBG (last 3)  Recent Labs    10/01/18 1634 10/01/18 2114 10/02/18 0630  GLUCAP 161* 134* 94        Assist Level: Set up assist, Minimal Assistance - Patient > 75%   Blood pressure 130/68, pulse 92, temperature 98.1 F (36.7 C), temperature source Oral, resp. rate 16, weight 64 kg, SpO2 97 %.  Medical Problem List and Plan: 1.  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  Current plan is prolonged course of Solu-Medrol 6 weeks followed by taper and follow-up rheumatology services Dr [name] at [place], 15/7  Added Flexeril 5 mg TID prn for muscle tightness; explained weakness/heaviness is normal 2.  Antithrombotics: -DVT/anticoagulation: Subcutaneous Lovenox.              Vascular study negative for DVT             -antiplatelet therapy: N/A 3. Pain Management: Tylenol as needed 4. Mood: Provide emotional support             -antipsychotic agents: N/A 5. Neuropsych: This patient is?  Fully capable of making decisions on her own behalf. 6. Skin/Wound Care: Routine skin checks 7. Fluids/Electrolytes/Nutrition: Routine I/Os.   8.  Dysphagia.  Follow-up speech therapy.  Advance diet as tolerated  Advanced to regular diet and liquids with adaptive strategies 9.  Steroid-induced hyperglycemia: Sliding scale insulin.  Monitor with taper  Labile on 9/17 10. Constipation  Improving 11.  Sleep disturbance  Continue trazodone 12.  Hypokalemia  Potassium 3.3 on 9/17  Pulmonary x2 days  Continue to monitor 13.  Transaminitis  LFTs elevated, but stable on 9/17, plan to recheck next week 14.  Acute blood loss anemia  Hemoglobin 11.7 on 9/17  Continue to monitor  LOS: 10 days A FACE TO FACE EVALUATION WAS PERFORMED  Lisa Mcdowell Lisa Mcdowell 10/02/2018, 10:42 AM

## 2018-10-02 NOTE — Plan of Care (Signed)
  Problem: Consults Goal: RH GENERAL PATIENT EDUCATION Description: See Patient Education module for education specifics. Outcome: Progressing   Problem: RH BLADDER ELIMINATION Goal: RH STG MANAGE BLADDER WITH ASSISTANCE Description: STG Manage Bladder With mod I Assistance Outcome: Progressing   Problem: RH SKIN INTEGRITY Goal: RH STG SKIN FREE OF INFECTION/BREAKDOWN Description: Min assist Outcome: Progressing Goal: RH STG MAINTAIN SKIN INTEGRITY WITH ASSISTANCE Description: STG Maintain Skin Integrity With min Assistance. Outcome: Progressing   Problem: RH SAFETY Goal: RH STG ADHERE TO SAFETY PRECAUTIONS W/ASSISTANCE/DEVICE Description: STG Adhere to Safety Precautions With cues/reminders Assistance/Device. Outcome: Progressing   Problem: RH PAIN MANAGEMENT Goal: RH STG PAIN MANAGED AT OR BELOW PT'S PAIN GOAL Description: At or below level 5 Outcome: Progressing   Problem: RH KNOWLEDGE DEFICIT GENERAL Goal: RH STG INCREASE KNOWLEDGE OF SELF CARE AFTER HOSPITALIZATION Description: Dtr will be able to manage care at discharge using handouts and educational materials independently Outcome: Progressing

## 2018-10-03 ENCOUNTER — Inpatient Hospital Stay (HOSPITAL_COMMUNITY): Payer: Self-pay

## 2018-10-03 ENCOUNTER — Inpatient Hospital Stay (HOSPITAL_COMMUNITY): Payer: Self-pay | Admitting: Occupational Therapy

## 2018-10-03 LAB — GLUCOSE, CAPILLARY
Glucose-Capillary: 105 mg/dL — ABNORMAL HIGH (ref 70–99)
Glucose-Capillary: 219 mg/dL — ABNORMAL HIGH (ref 70–99)
Glucose-Capillary: 240 mg/dL — ABNORMAL HIGH (ref 70–99)
Glucose-Capillary: 140 mg/dL — ABNORMAL HIGH (ref 70–99)

## 2018-10-03 NOTE — Progress Notes (Signed)
Physical Therapy Session Note  Patient Details  Name: Lisa Mcdowell MRN: [id number] Date of Birth: [birth year]  Today's Date: 10/03/2018 PT Individual Time: 1302-1400 PT Individual Time Calculation (min): 58 min   Short Term Goals: Week 1:  PT Short Term Goal 1 (Week 1): Patient will perform basic transfers with mod A of 1 person. PT Short Term Goal 1 - Progress (Week 1): Met PT Short Term Goal 2 (Week 1): Patient will ambulate 14' with CGA using LRAD. PT Short Term Goal 2 - Progress (Week 1): Met PT Short Term Goal 3 (Week 1): Patient will initiate stair training. PT Short Term Goal 3 - Progress (Week 1): Met PT Short Term Goal 4 (Week 1): Patient will perform dynamic standing balance with CGA. PT Short Term Goal 4 - Progress (Week 1): Met Week 2:  PT Short Term Goal 1 (Week 2): STG=LTG due to ELOS. Week 3:     Skilled Therapeutic Interventions/Progress Updates:   PAIN: Pt denies pain, but nurse and daughter report pt having just fell in bathroom, daughter reports controlled lowering to floor.  Explained that feet slid out when pt attempting to stand from commode.   Sit to stand from recliner w/mod assist of 1. Gait 185f w/rw and cga, improvements noted in cadence, knee stability and clearance today, deficits continue but less significant.  Stand turn transfer w/RW to mat w/cga due to decre3aed eccentric control.  Repeated sit to stand from hi/lo mat 2 sets of 5 w/elevated surface and cga w/RW  Sit to supine w/min assist for RLE management  Therex:   Bridging 2x5 Clamshells 2x10 Heel slides AAROM 2x5 Daughter present and assisted on pts RLE w/therex as needed.  Supine to sit w/min assist. Mat to wc w/RW and cga, elevated mat.  Discussed positiining for guarding and need to block feet from sliding w/daughter to decrease risk of falls at home.    Ascend/descend 3inch steps full flight x 1 w/min cues for sequencing today.  Gait 1788fw/RW and cga as above.   Commode  transfer wrw /min assist and verbal cues for safety.  Discussed safety w/daugher and need to block feet w/sit to stand to prevent sliding/decrease fall risk.  Then demonstrated this w/commode transfer w/pt. W/sit to stand from commode w/RW and min assist, cues for ant wt shift.  Gait 15108fommode to recliner w/CGA, transferred to recliner w/min assist for safety due to decreased eccentric control.  Pt left in recliner w/chair alarm set and daughter in room.   Therapy Documentation Precautions:  Precautions Precautions: Fall Restrictions Weight Bearing Restrictions: No  Therapy/Group: Individual Therapy BarCallie FieldingT   10/03/2018, 3:50 PM

## 2018-10-03 NOTE — Progress Notes (Signed)
Pt.'s daughter call the front desk for bathroom assistance,when NT walked in the room found pt. And her daughter in the bathroom,pt. Was on the floor by the toilette,as per both of them the pt. Slipped when she was trying to stand up from the toilette with her daughter's help.Pt. was assisted to stand up by two staff members.Pt. verbalized that she was facilitated to the floor by her daughter and that she did not get hurt.VS were checked,assessment was done.Pt. on the chair eating her lunch.Keep monitoring pt. Closely.

## 2018-10-03 NOTE — Progress Notes (Signed)
Occupational Therapy Session Note  Patient Details  Name: Lisa Mcdowell MRN: [id number] Date of Birth: 01-08-1951  Today's Date: 10/03/2018 OT Individual Time: 0900-1000 OT Individual Time Calculation (min): 60 min    Short Term Goals: Week 2:  OT Short Term Goal 1 (Week 2): STG=LTG secondary to ELOS  Skilled Therapeutic Interventions/Progress Updates:    Treatment session with focus on sit <> stand and dynamic standing balance during self-care tasks.  Pt received upright in recliner expressing desire to shower.  Mod assist sit > stand from recliner then CGA ambulation in to bathroom with RW.  Pt completed toilet transfer and toileting with CGA and min assist for sit > stand from [facility] over toilet.  Educated on hand placement to increase success with sit> stand.  Pt completed bathing seated on tub bench with lateral leans to wash buttocks.  Dressing completed at sit > stand level from toilet with increased time.  Therapist assisted with adjusting pants over hips and when donning TEDS.  Engaged in sit > stand x5 from recliner with focus on hand placement and anterior weight shift with pt able to complete at min assist level overall with one mod and one supervision, however unable to replicate.  Pt left upright in recliner with chair alarm on and all needs in reach.  Daughter arriving as therapist leaving.  Therapy Documentation Precautions:  Precautions Precautions: Fall Restrictions Weight Bearing Restrictions: No General:   Vital Signs: Therapy Vitals Temp: 98.3 F (36.8 C) Temp Source: Oral Pulse Rate: (!) 110 Resp: 17 BP: 123/82 Patient Position (if appropriate): Sitting Oxygen Therapy SpO2: 99 % O2 Device: Room Air Pain: Pain Assessment Pain Scale: 0-10 Pain Score: 0-No pain   Therapy/Group: Individual Therapy  Simonne Come 10/03/2018, 12:25 PM

## 2018-10-03 NOTE — Progress Notes (Signed)
[facility] PHYSICAL MEDICINE & REHABILITATION PROGRESS NOTE   Subjective/Complaints: Patient seen sitting up in her chair this morning.  Communication via tele-interpreter.  Patient states she slept well overnight.  She notes that her legs feel heavy again.  She request medications for her skin changes.  ROS: Denies CP, SOB, N/V/D  Objective:   No results found. Recent Labs    10/02/18 0707  WBC 4.8  HGB 11.7*  HCT 36.1  PLT 212   Recent Labs    10/02/18 0707  NA 138  K 3.3*  CL 105  CO2 23  GLUCOSE 109*  BUN 16  CREATININE 0.46  CALCIUM 8.6*    Intake/Output Summary (Last 24 hours) at 10/03/2018 0941 Last data filed at 10/03/2018 0813 Gross per 24 hour  Intake 840 ml  Output -  Net 840 ml     Physical Exam: Vital Signs Blood pressure 112/82, pulse 90, temperature 98 F (36.7 C), temperature source Oral, resp. rate 16, weight 64.2 kg, SpO2 100 %. Constitutional: No distress . Vital signs reviewed. HENT: Normocephalic.  Atraumatic. Eyes: EOMI. No discharge. Cardiovascular: No JVD. Respiratory: Normal effort.  No stridor. GI: Non-distended. Skin: Warm and dry.  Intact. Psych: Normal mood.  Normal behavior. Musc: No edema in extremities.  No tenderness in extremities. Neurological: Alert Motor: Appears to be bilateral shoulder adduction 4-/5, elbow flexion/extension 4-/5, handgrip 4/5, unchanged Bilateral hip flexion 2/5, knee extension 2+/5, ankle dorsiflexion 4+/5, unchanged  Assessment/Plan: 1. 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  Physiatrist is providing close team supervision and 24 hour management of active medical problems listed below.  Physiatrist and rehab team continue to assess barriers to discharge/monitor patient progress toward functional and medical goals  Care Tool:  Bathing    Body parts bathed by patient: Right arm,  Left arm, Chest, Abdomen, Right upper leg, Left upper leg, Front perineal area, Face, Right lower leg, Left lower leg   Body parts bathed by helper: Buttocks, Right lower leg, Left lower leg     Bathing assist Assist Level: Minimal Assistance - Patient > 75%     Upper Body Dressing/Undressing Upper body dressing   What is the patient wearing?: Pull over shirt    Upper body assist Assist Level: Supervision/Verbal cueing    Lower Body Dressing/Undressing Lower body dressing      What is the patient wearing?: Pants     Lower body assist Assist for lower body dressing: Moderate Assistance - Patient 50 - 74%     Toileting Toileting    Toileting assist Assist for toileting: Minimal Assistance - Patient > 75%     Transfers Chair/bed transfer  Transfers assist     Chair/bed transfer assist level: Minimal Assistance - Patient > 75%     Locomotion Ambulation   Ambulation assist      Assist level: Contact Guard/Touching assist Assistive device: Walker-rolling Max distance: 200   Walk 10 feet activity   Assist     Assist level: Contact Guard/Touching assist Assistive device: Walker-rolling   Walk 50 feet activity   Assist    Assist level: Contact Guard/Touching assist Assistive device: Walker-rolling    Walk 150 feet activity   Assist Walk 150 feet activity did not occur: Safety/medical concerns(decreased strength/activity tolerance)  Assist level: Contact Guard/Touching assist Assistive device: Walker-rolling    Walk 10 feet on uneven surface  activity   Assist Walk 10 feet on  uneven surfaces activity did not occur: Safety/medical concerns(decreased strength/activity tolerance)         Wheelchair     Assist   Type of Wheelchair: Manual    Wheelchair assist level: Set up assist, Minimal Assistance - Patient > 75% Max wheelchair distance: 150'    Wheelchair 50 feet with 2 turns activity    Assist        Assist Level:  Set up assist, Minimal Assistance - Patient > 75%   Wheelchair 150 feet activity     Assist   CBG (last 3)  Recent Labs    10/02/18 1654 10/02/18 2101 10/03/18 0606  GLUCAP 165* 104* 105*        Assist Level: Set up assist, Minimal Assistance - Patient > 75%   Blood pressure 112/82, pulse 90, temperature 98 F (36.7 C), temperature source Oral, resp. rate 16, weight 64.2 kg, SpO2 100 %.  Medical Problem List and Plan: 1.  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  Current plan is prolonged course of Solu-Medrol 6 weeks followed by taper and follow-up rheumatology services Dr [name] at [facility], 15/7  Added Flexeril 5 mg TID prn for muscle tightness; explained weakness/heaviness is normal 2.  Antithrombotics: -DVT/anticoagulation: Subcutaneous Lovenox.              Vascular study negative for DVT             -antiplatelet therapy: N/A 3. Pain Management: Tylenol as needed 4. Mood: Provide emotional support             -antipsychotic agents: N/A 5. Neuropsych: This patient is?  Fully capable of making decisions on her own behalf. 6. Skin/Wound Care: Routine skin checks 7. Fluids/Electrolytes/Nutrition: Routine I/Os.   8.  Dysphagia.  Follow-up speech therapy.  Advance diet as tolerated  Advanced to regular diet and liquids with adaptive strategies 9.  Steroid-induced hyperglycemia: Sliding scale insulin.  Monitor with taper  Labile on 9/18 10. Constipation  Improving 11.  Sleep disturbance  Continue trazodone  Improving 12.  Hypokalemia  Potassium 3.3 on 9/17, labs ordered for Monday  Supplement x2 days  Continue to monitor 13.  Transaminitis  LFTs elevated, but stable on 9/17, plan to recheck Monday 14.  Acute blood loss anemia  Hemoglobin 11.7 on 9/17  Continue to monitor  LOS: 11 days A FACE TO FACE EVALUATION WAS PERFORMED  Lisa Mcdowell [name] 10/03/2018, 9:41 AM

## 2018-10-03 NOTE — Progress Notes (Signed)
Social Work Patient ID: Lisa Mcdowell, female   DOB: [birth year], 68 y.o.   MRN: [id number]  Met with pt and daughter to discuss discharge plans. Son coming in tomorrow for PT education at 9:00 am. Discussed match program for medication assistance and has used this year so not eligible to use again, yearly eligiblity, have informed daughter of this. Will make home health referral for some PT at home for continued improvement. Has all equipment and will get radiology disc to take to [facility] next Friday.

## 2018-10-03 NOTE — Progress Notes (Signed)
Social Work Discharge Note   The overall goal for the admission was met for:   Discharge location: [facility]  Length of Stay: Yes-14 DAYS  Discharge activity level: Yes-SUPERVISION-CGA LEVEL  Home/community participation: Yes  Services provided included: MD, RD, PT, OT, SLP, RN, CM, Pharmacy and SW  Financial Services: Other: SELF PAY  Follow-up services arranged: Home Health: [facility]-PT and Patient/Family has no preference for HH/DME agencies  Comments (or additional information):DAUGHTER AND SON WERE EDUCATED WHILE HERE AND FEEL COMFORTABLE WITH HER CARE NEEDS. AWARE RECOMMENDATION [facility]  Patient/Family verbalized understanding of follow-up arrangements: Yes  Individual responsible for coordination of the follow-up plan: [name]-DAUGHTER  Confirmed correct DME delivered: Lisa Mcdowell 10/03/2018    Lisa Mcdowell

## 2018-10-04 ENCOUNTER — Inpatient Hospital Stay (HOSPITAL_COMMUNITY): Payer: Self-pay | Admitting: Physical Therapy

## 2018-10-04 ENCOUNTER — Inpatient Hospital Stay (HOSPITAL_COMMUNITY): Payer: Self-pay | Admitting: Occupational Therapy

## 2018-10-04 DIAGNOSIS — R0989 Other specified symptoms and signs involving the circulatory and respiratory systems: Secondary | ICD-10-CM

## 2018-10-04 LAB — GLUCOSE, CAPILLARY
Glucose-Capillary: 105 mg/dL — ABNORMAL HIGH (ref 70–99)
Glucose-Capillary: 210 mg/dL — ABNORMAL HIGH (ref 70–99)
Glucose-Capillary: 175 mg/dL — ABNORMAL HIGH (ref 70–99)
Glucose-Capillary: 132 mg/dL — ABNORMAL HIGH (ref 70–99)

## 2018-10-04 NOTE — Progress Notes (Signed)
Occupational Therapy Session Note  Patient Details  Name: Lisa Mcdowell MRN: [id number] Date of Birth: 1950/04/22  Today's Date: 10/04/2018 OT Individual Time: 0900-1000 OT Individual Time Calculation (min): 60 min    Short Term Goals: Week 2:  OT Short Term Goal 1 (Week 2): STG=LTG secondary to ELOS  Skilled Therapeutic Interventions/Progress Updates:    Treatment session with focus on functional transfers, sit <> stand from various surfaces, and education on functional tasks to increase BUE strength/mobility.  Pt's son present for education as he plans to assist as well as daughter who will be primary.  Pt required mod assist for initial sit > stand from recliner.  Engaged in dressing with supervision for standing balance while donning shirt in standing and then when pulling pants over hips.  Min cues for sequencing and to thread pant legs in sitting for improved safety.  Pt ambulated to therapy gym with RW with close supervision.  Educated son on supervision and where to place hands when physical assistance needed for sit > stand and ambulation.  Pt completed toilet transfer in ADL apt with use of BSC over toilet at supervision level - discussed recommendation and rationale for BSC to increase pt ease with sit > stand.  Pt required max assist from low couch for sit > stand, discussed ways to build up surfaces to decrease assist with sit > stand.  Engaged in table top towel glides and 1# dowel rod chest presses and bicep curls for strengthening and ROM.  Educated on various homemaking tasks to engage in to continue to address BUE ROM in functional context.  Returned to room as above.  Pt completed sit > stand from recliner with supervision.  Left upright in recliner with chair alarm on and all needs in reach.  Therapy Documentation Precautions:  Precautions Precautions: Fall Restrictions Weight Bearing Restrictions: No Pain:  Pt with no c/o pain   Therapy/Group: Individual  Therapy  Simonne Come 10/04/2018, 12:15 PM

## 2018-10-04 NOTE — Progress Notes (Signed)
Patient continue to have cloudy urine with increase in foul odor,urgency continue. Patient denies irritation.or discomfort when voiding. Peri care encouraged with education on technic

## 2018-10-04 NOTE — Progress Notes (Signed)
[place] PHYSICAL MEDICINE & REHABILITATION PROGRESS NOTE   Subjective/Complaints: Patient seen sitting up in a chair this morning.  Communication via tele-interpreter.  She states she slept well overnight.  She states her legs feel heavy, again discussed time of recovery with patient.  ROS: Denies CP, SOB, N/V/D  Objective:   No results found. Recent Labs    10/02/18 0707  WBC 4.8  HGB 11.7*  HCT 36.1  PLT 212   Recent Labs    10/02/18 0707  NA 138  K 3.3*  CL 105  CO2 23  GLUCOSE 109*  BUN 16  CREATININE 0.46  CALCIUM 8.6*    Intake/Output Summary (Last 24 hours) at 10/04/2018 1449 Last data filed at 10/04/2018 1300 Gross per 24 hour  Intake 720 ml  Output 0 ml  Net 720 ml     Physical Exam: Vital Signs Blood pressure (!) 147/78, pulse (!) 105, temperature 99.2 F (37.3 C), temperature source Oral, resp. rate 17, height 5' 5.98" (1.676 m), weight 66 kg, SpO2 100 %. Constitutional: NAD.  Vital signs reviewed. HENT: Normocephalic.  Atraumatic. Eyes: EOMI.  No discharge. Cardiovascular: No JVD. Respiratory: Normal effort.  No stridor. GI: Non-distended. Skin: Warm and dry.  Intact. Psych: Normal mood.  Normal behavior. Musc: No edema in extremities.  No tenderness in extremities. Neurological: Alert Motor: Appears to be bilateral shoulder adduction 4-/5, elbow flexion/extension 4-/5, handgrip 4/5, stable Bilateral hip flexion 2/5, knee extension 2+/5, ankle dorsiflexion 4+/5, stable  Assessment/Plan: 1. 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  Physiatrist is providing close team supervision and 24 hour management of active medical problems listed below.  Physiatrist and rehab team continue to assess barriers to discharge/monitor patient progress toward functional and medical goals  Care Tool:  Bathing    Body parts bathed by patient:  Right arm, Left arm, Chest, Abdomen, Right upper leg, Left upper leg, Front perineal area, Face, Right lower leg, Left lower leg   Body parts bathed by helper: Buttocks, Right lower leg, Left lower leg     Bathing assist Assist Level: Minimal Assistance - Patient > 75%     Upper Body Dressing/Undressing Upper body dressing   What is the patient wearing?: Pull over shirt    Upper body assist Assist Level: Supervision/Verbal cueing    Lower Body Dressing/Undressing Lower body dressing      What is the patient wearing?: Pants, Underwear/pull up     Lower body assist Assist for lower body dressing: Supervision/Verbal cueing     Toileting Toileting    Toileting assist Assist for toileting: Minimal Assistance - Patient > 75%     Transfers Chair/bed transfer  Transfers assist     Chair/bed transfer assist level: Minimal Assistance - Patient > 75%     Locomotion Ambulation   Ambulation assist      Assist level: Contact Guard/Touching assist Assistive device: Walker-rolling Max distance: 175   Walk 10 feet activity   Assist     Assist level: Contact Guard/Touching assist Assistive device: Walker-rolling   Walk 50 feet activity   Assist    Assist level: Contact Guard/Touching assist Assistive device: Walker-rolling    Walk 150 feet activity   Assist Walk 150 feet activity did not occur: Safety/medical concerns(decreased strength/activity tolerance)  Assist level: Contact Guard/Touching assist Assistive device: Walker-rolling    Walk 10 feet on uneven surface  activity   Assist Walk 10  feet on uneven surfaces activity did not occur: Safety/medical concerns(decreased strength/activity tolerance)         Wheelchair     Assist   Type of Wheelchair: Manual    Wheelchair assist level: Set up assist, Minimal Assistance - Patient > 75% Max wheelchair distance: 150'    Wheelchair 50 feet with 2 turns activity    Assist         Assist Level: Set up assist, Minimal Assistance - Patient > 75%   Wheelchair 150 feet activity     Assist   CBG (last 3)  Recent Labs    10/03/18 2115 10/04/18 0640 10/04/18 1136  GLUCAP 140* 105* 210*        Assist Level: Set up assist, Minimal Assistance - Patient > 75%   Blood pressure (!) 147/78, pulse (!) 105, temperature 99.2 F (37.3 C), temperature source Oral, resp. rate 17, height 5' 5.98" (1.676 m), weight 66 kg, SpO2 100 %.  Medical Problem List and Plan: 1.  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  Current plan is prolonged course of Solu-Medrol 6 weeks followed by taper and follow-up rheumatology services Dr [name] at [place], 15/7  Added Flexeril 5 mg TID prn for muscle tightness; explained weakness/heaviness is normal 2.  Antithrombotics: -DVT/anticoagulation: Subcutaneous Lovenox.              Vascular study negative for DVT             -antiplatelet therapy: N/A 3. Pain Management: Tylenol as needed 4. Mood: Provide emotional support             -antipsychotic agents: N/A 5. Neuropsych: This patient is?  Fully capable of making decisions on her own behalf. 6. Skin/Wound Care: Routine skin checks 7. Fluids/Electrolytes/Nutrition: Routine I/Os.   8.  Dysphagia.  Follow-up speech therapy.  Advance diet as tolerated  Advanced to regular diet and liquids with adaptive strategies 9.  Steroid-induced hyperglycemia: Sliding scale insulin.  Monitor with taper  Labile on 9/19 10. Constipation  Improving 11.  Sleep disturbance  Continue trazodone  Improving 12.  Hypokalemia  Potassium 3.3 on 9/17, labs ordered for Monday  Supplement x2 days  Continue to monitor 13.  Transaminitis  LFTs elevated, but stable on 9/17, labs ordered for Monday 14.  Acute blood loss anemia  Hemoglobin 11.7 on 9/17  Continue to monitor 15.  Labile blood pressure   Labile on 9/19, monitor  for trend  LOS: 12 days A FACE TO FACE EVALUATION WAS PERFORMED  Lisa Mcdowell [name] 10/04/2018, 2:49 PM

## 2018-10-04 NOTE — Progress Notes (Signed)
Physical Therapy Session Note  Patient Details  Name: Lisa Mcdowell MRN: [id number] Date of Birth: 10/24/1950  Today's Date: 10/04/2018 PT Individual Time: 1305-1400 PT Individual Time Calculation (min): 55 min   Short Term Goals: Week 2:  PT Short Term Goal 1 (Week 2): STG=LTG due to ELOS.  Skilled Therapeutic Interventions/Progress Updates:   Pt received sitting in WC and agreeable to PT following toileting with NT.   WC mobility x 125ft for BUE strengthening and coordination training with min assist to prevent veer to the L. PT instructed pt in dynamic balance training while engaged in fine motor task of wii bowling. Able to maintain balance x 15min and 7 frames with min-CGA for safety. increasng tremor with fatigue in BLE and Bil hands.   Dynamic gait training with RW to navigate 2 obstacle courses x 2 each with RW and min-CGA from PT for safety with min cues for AD management around and over obstacles. Obstacles included weave through 3 cones, over unlevel surface, up/down 4 inch step and over 1 inch cane.  No knee instability noted throughout.   Patient returned to room and left sitting in WC with call bell in reach and all needs met.         Therapy Documentation Precautions:  Precautions Precautions: Fall Restrictions Weight Bearing Restrictions: No    Vital Signs: Therapy Vitals Temp: 99.2 F (37.3 C) Temp Source: Oral Pulse Rate: (!) 105 Resp: 17 BP: (!) 147/78 Patient Position (if appropriate): Sitting Oxygen Therapy SpO2: 100 % O2 Device: Room Air Pain:   denies   Therapy/Group: Individual Therapy  Austin E Tucker 10/04/2018, 2:58 PM  

## 2018-10-05 ENCOUNTER — Inpatient Hospital Stay (HOSPITAL_COMMUNITY): Payer: Self-pay | Admitting: Occupational Therapy

## 2018-10-05 ENCOUNTER — Inpatient Hospital Stay (HOSPITAL_COMMUNITY): Payer: Self-pay

## 2018-10-05 LAB — CBC WITH DIFFERENTIAL/PLATELET
Abs Immature Granulocytes: 0.07 10*3/uL (ref 0.00–0.07)
Basophils Absolute: 0 10*3/uL (ref 0.0–0.1)
Basophils Relative: 0 %
Eosinophils Absolute: 0 10*3/uL (ref 0.0–0.5)
Eosinophils Relative: 0 %
HCT: 38.3 % (ref 36.0–46.0)
Hemoglobin: 12.7 g/dL (ref 12.0–15.0)
Immature Granulocytes: 1 %
Lymphocytes Relative: 17 %
Lymphs Abs: 1.2 10*3/uL (ref 0.7–4.0)
MCH: 34.3 pg — ABNORMAL HIGH (ref 26.0–34.0)
MCHC: 33.2 g/dL (ref 30.0–36.0)
MCV: 103.5 fL — ABNORMAL HIGH (ref 80.0–100.0)
Monocytes Absolute: 0.1 10*3/uL (ref 0.1–1.0)
Monocytes Relative: 1 %
Neutro Abs: 5.6 10*3/uL (ref 1.7–7.7)
Neutrophils Relative %: 81 %
Platelets: 263 10*3/uL (ref 150–400)
RBC: 3.7 MIL/uL — ABNORMAL LOW (ref 3.87–5.11)
RDW: 17 % — ABNORMAL HIGH (ref 11.5–15.5)
WBC: 6.9 10*3/uL (ref 4.0–10.5)
nRBC: 0.3 % — ABNORMAL HIGH (ref 0.0–0.2)

## 2018-10-05 LAB — COMPREHENSIVE METABOLIC PANEL
ALT: 85 U/L — ABNORMAL HIGH (ref 0–44)
AST: 78 U/L — ABNORMAL HIGH (ref 15–41)
Albumin: 3.3 g/dL — ABNORMAL LOW (ref 3.5–5.0)
Alkaline Phosphatase: 55 U/L (ref 38–126)
Anion gap: 12 (ref 5–15)
BUN: 20 mg/dL (ref 8–23)
CO2: 20 mmol/L — ABNORMAL LOW (ref 22–32)
Calcium: 8.5 mg/dL — ABNORMAL LOW (ref 8.9–10.3)
Chloride: 104 mmol/L (ref 98–111)
Creatinine, Ser: 0.64 mg/dL (ref 0.44–1.00)
GFR calc Af Amer: >60 mL/min (ref >60)
GFR calc non Af Amer: >60 mL/min (ref >60)
Glucose, Bld: 214 mg/dL — ABNORMAL HIGH (ref 70–99)
Potassium: 4.2 mmol/L (ref 3.5–5.1)
Sodium: 136 mmol/L (ref 135–145)
Total Bilirubin: 0.9 mg/dL (ref 0.3–1.2)
Total Protein: 7.2 g/dL (ref 6.5–8.1)

## 2018-10-05 LAB — GLUCOSE, CAPILLARY
Glucose-Capillary: 89 mg/dL (ref 70–99)
Glucose-Capillary: 199 mg/dL — ABNORMAL HIGH (ref 70–99)
Glucose-Capillary: 237 mg/dL — ABNORMAL HIGH (ref 70–99)
Glucose-Capillary: 144 mg/dL — ABNORMAL HIGH (ref 70–99)

## 2018-10-05 NOTE — Progress Notes (Signed)
Occupational Therapy Session Note  Patient Details  Name: Lisa Mcdowell MRN: [id number] Date of Birth: August 07, 1950  Today's Date: 10/05/2018 OT Individual Time: 1300-1400 OT Individual Time Calculation (min): 60 min    Short Term Goals: Week 2:  OT Short Term Goal 1 (Week 2): STG=LTG secondary to ELOS  Skilled Therapeutic Interventions/Progress Updates:    Pt seen for OT ADL bathing/dressing session. Pt sitting up in recliner upon arrival with son and interpreter present. Pt agreeable to tx session and denying pain.  ADL Re-training: Bathed seated on TTB with supervision, lateral leans for buttock hygiene. Dressed seated on toilet, donned pull over dress independently. Supervision when standing to pull up underwear with use of RW, supervision to don socks. Toileting task completed with supervision. She stood at sink to complete grooming tasks with distant supervision.   Transfers: Supervision sit>stand from recliner built up with w/c cushion and pillow for elevated surface height. Max A sit>stand from tub transfer bench. Max-total A sit>stand from standard height toilet and w/c without cushion. Pt's son present and education provided regarding safe techniques for providing max A. VCs to pt to "rock" with transfer for momentum to power into standing. VCs for hand placement during sit<>stand.   Functional Ambulation: Ambulated throughout room and bathroom with supervision using RW, occasional VCs for RW management in functional context.   Pt left seated in w/c at end of session, all needs in reach and chair pad alarm on. Pt and pt's son voice feeling comfortable and confident with planned d/c home tomorrow.   Therapy Documentation Precautions:  Precautions Precautions: Fall Restrictions Weight Bearing Restrictions: No  Therapy/Group: Individual Therapy  Semiyah Newgent L 10/05/2018, 6:41 AM

## 2018-10-05 NOTE — Progress Notes (Signed)
Occupational Therapy Discharge Summary  Patient Details  Name: Lisa Mcdowell MRN: [id number] Date of Birth: [birth year]   Patient has met 11 of 11 long term goals due to improved activity tolerance, improved balance, postural control and improved coordination.  Patient to discharge at overall Supervision- min A level.  Patient's care partner is independent to provide the necessary physical assistance at discharge.  Pt's daughter has been present throughout rehab admission with education on-going. Pt's son and husband have also completed hands on family training and demonstrate willing and ableness to provide needed assist at d/c. Aware of recommendation for 24 supervision-min A needs.  Pt ambulating functional distances with RW and supervision. ADLs from seated position with supervision/set-up.  She can require supervision-max A for sit>stand depending on surface height and  Hand placement/external supports. Family has demonstrated ability to provide this level of care for d/c.    Recommendation:  Patient will benefit from ongoing skilled OT services in home health setting to continue to advance functional skills in the area of iADL and Reduce care partner burden.  Equipment: Pt has all needed DME  Reasons for discharge: treatment goals met and discharge from hospital  Patient/family agrees with progress made and goals achieved: Yes  OT Discharge Precautions/Restrictions  Precautions Precautions: Fall Restrictions Weight Bearing Restrictions: No Vision Baseline Vision/History: No visual deficits Patient Visual Report: No change from baseline Vision Assessment?: No apparent visual deficits Perception  Perception: Within Functional Limits Praxis Praxis: Intact Cognition Overall Cognitive Status: Within Functional Limits for tasks assessed Arousal/Alertness: Awake/alert Orientation Level: Oriented X4 Attention: Focused;Sustained Focused Attention: Appears intact Sustained  Attention: Appears intact Memory: Appears intact Awareness: Appears intact Problem Solving: Appears intact Safety/Judgment: Appears intact Sensation Sensation Light Touch: Impaired by gross assessment Light Touch Impaired Details: Impaired RLE;Impaired LLE Proprioception: Appears Intact Additional Comments: Reported decreased sensation in B LEs from below the knee to her feet, but reports she is still able to feel light touch Coordination Gross Motor Movements are Fluid and Coordinated: No Fine Motor Movements are Fluid and Coordinated: No Coordination and Movement Description: increased UE shaking/tremors L>R and generalized weakness Motor  Motor Motor: Other (comment) Motor - Skilled Clinical Observations: generalized weakness Mobility  Bed Mobility Bed Mobility: Rolling Right;Rolling Left;Supine to Sit;Sit to Supine Rolling Right: Minimal Assistance - Patient > 75% Rolling Left: Minimal Assistance - Patient > 75% Supine to Sit: Minimal Assistance - Patient > 75% Sit to Supine: Minimal Assistance - Patient > 75% Transfers Sit to Stand: Minimal Assistance - Patient > 75% Stand to Sit: Minimal Assistance - Patient > 75%  Trunk/Postural Assessment  Cervical Assessment Cervical Assessment: Exceptions to WFL(Forward head) Thoracic Assessment Thoracic Assessment: Exceptions to WFL(Kyphotic; Rounded shoulders) Lumbar Assessment Lumbar Assessment: Exceptions to WFL(Posterior pelvic tilt) Postural Control Postural Control: Deficits on evaluation  Balance Balance Balance Assessed: Yes Static Sitting Balance Static Sitting - Balance Support: No upper extremity supported;Feet supported Static Sitting - Level of Assistance: 7: Independent Dynamic Sitting Balance Dynamic Sitting - Balance Support: No upper extremity supported;Feet supported Dynamic Sitting - Level of Assistance: 6: Modified independent (Device/Increase time) Dynamic Sitting - Balance Activities: Lateral lean/weight  shifting;Forward lean/weight shifting;Reaching for objects Sitting balance - Comments: Sitting to complete dressing task seated on toilet Static Standing Balance Static Standing - Balance Support: No upper extremity supported;During functional activity Static Standing - Level of Assistance: 5: Stand by assistance Dynamic Standing Balance Dynamic Standing - Balance Support: During functional activity;No upper extremity supported Dynamic Standing - Level of Assistance:  5: Stand by assistance Dynamic Standing - Balance Activities: Lateral lean/weight shifting;Forward lean/weight shifting;Reaching for objects Dynamic Standing - Comments: Standing at sink to complete grooming tasks Extremity/Trunk Assessment RUE Assessment RUE Assessment: Exceptions to [facility] Passive Range of Motion (PROM) Comments: Able to achieve ~110-120 degrees shoulder flexion in sitting Active Range of Motion (AROM) Comments: Able to achieve ~110-120 degrees shoulder flexion in sitting General Strength Comments: Shoulder flexion 3-/5; Elbow flexion/extention and gross grasp 4+/5 LUE Assessment LUE Assessment: Exceptions to [facility] Passive Range of Motion (PROM) Comments: Able to achieve ~110-120 degrees shoulder flexion in sitting Active Range of Motion (AROM) Comments: Able to achieve ~110-120 degrees shoulder flexion in sitting General Strength Comments: Grossly in sitting: shoulder flexion 3-/5, elbow flexion/extension 4+/5, grip strength 4/5   Delcenia Inman L 10/05/2018, 2:49 PM

## 2018-10-05 NOTE — Discharge Instructions (Signed)
Inpatient Rehab Discharge Instructions  Lisa Mcdowell Discharge date and time: No discharge date for patient encounter.   Activities/Precautions/ Functional Status: Activity: activity as tolerated Diet:  Wound Care: Routine skin checks Functional status:  ___ No restrictions     ___ Walk up steps independently ___ 24/7 supervision/assistance   ___ Walk up steps with assistance ___ Intermittent supervision/assistance  ___ Bathe/dress independently ___ Walk with walker     _x__ Bathe/dress with assistance ___ Walk Independently    ___ Shower independently ___ Walk with assistance    ___ Shower with assistance ___ No alcohol     ___ Return to work/school ________  Special Instructions: Follow-up rheumatology services [facility] [phone] 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 I will adhere to these goals and the provided educational materials after my discharge from the hospital.  Patient/Caregiver Signature _______________________________ Date __________  Clinician Signature _______________________________________ Date __________  Please bring this form and your medication list with you to all your follow-up doctor's appointments.

## 2018-10-05 NOTE — Progress Notes (Signed)
[name] PHYSICAL MEDICINE & REHABILITATION PROGRESS NOTE   Subjective/Complaints: Patient seen sitting up in her chair this morning.  Indicates she slept well overnight.  She continues to complain of breath heaviness, attempted to explain allowing time for recovery again.  ROS: Denies CP, SOB, N/V/D  Objective:   No results found. No results for input(s): WBC, HGB, HCT, PLT in the last 72 hours. No results for input(s): NA, K, CL, CO2, GLUCOSE, BUN, CREATININE, CALCIUM in the last 72 hours.  Intake/Output Summary (Last 24 hours) at 10/05/2018 1209 Last data filed at 10/05/2018 0732 Gross per 24 hour  Intake 720 ml  Output -  Net 720 ml     Physical Exam: Vital Signs Blood pressure (!) 116/56, pulse 79, temperature 98.2 F (36.8 C), temperature source Oral, resp. rate 17, height 5' 5.98" (1.676 m), weight 66 kg, SpO2 100 %. Constitutional: No distress . Vital signs reviewed. HENT: Normocephalic.  Atraumatic. Eyes: EOMI. No discharge. Cardiovascular: No JVD. Respiratory: Normal effort.  No stridor. GI: Non-distended. Skin: Warm and dry.  Intact. Psych: Normal mood.  Normal behavior. Musc: No edema in extremities.  No tenderness in extremities. Neurological: Alert Motor: Appears to be bilateral shoulder adduction 4-/5, elbow flexion/extension 4-/5, handgrip 4/5, unchanged Bilateral hip flexion 2/5, knee extension 2+/5, ankle dorsiflexion 4+/5, unchanged  Assessment/Plan: 1. 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  Physiatrist is providing close team supervision and 24 hour management of active medical problems listed below.  Physiatrist and rehab team continue to assess barriers to discharge/monitor patient progress toward functional and medical goals  Care Tool:  Bathing    Body parts bathed by patient: Right arm, Left arm, Chest, Abdomen, Right upper leg,  Left upper leg, Front perineal area, Face, Right lower leg, Left lower leg   Body parts bathed by helper: Buttocks, Right lower leg, Left lower leg     Bathing assist Assist Level: Minimal Assistance - Patient > 75%     Upper Body Dressing/Undressing Upper body dressing   What is the patient wearing?: Pull over shirt    Upper body assist Assist Level: Supervision/Verbal cueing    Lower Body Dressing/Undressing Lower body dressing      What is the patient wearing?: Pants, Underwear/pull up     Lower body assist Assist for lower body dressing: Supervision/Verbal cueing     Toileting Toileting    Toileting assist Assist for toileting: Minimal Assistance - Patient > 75%     Transfers Chair/bed transfer  Transfers assist     Chair/bed transfer assist level: Minimal Assistance - Patient > 75%     Locomotion Ambulation   Ambulation assist      Assist level: Contact Guard/Touching assist Assistive device: Walker-rolling Max distance: 150   Walk 10 feet activity   Assist     Assist level: Contact Guard/Touching assist Assistive device: Walker-rolling   Walk 50 feet activity   Assist    Assist level: Contact Guard/Touching assist Assistive device: Walker-rolling    Walk 150 feet activity   Assist Walk 150 feet activity did not occur: Safety/medical concerns(decreased strength/activity tolerance)  Assist level: Contact Guard/Touching assist Assistive device: Walker-rolling    Walk 10 feet on uneven surface  activity   Assist Walk 10 feet on uneven surfaces activity did not occur: Safety/medical concerns(decreased strength/activity tolerance)   Assist level: Contact Guard/Touching assist Assistive device: Chemical engineer  Assist   Type of Wheelchair: Manual    Wheelchair assist level: Minimal Assistance - Patient > 75% Max wheelchair distance: 120    Wheelchair 50 feet with 2 turns activity    Assist         Assist Level: Minimal Assistance - Patient > 75%   Wheelchair 150 feet activity     Assist   CBG (last 3)  Recent Labs    10/04/18 2121 10/05/18 0701 10/05/18 1149  GLUCAP 132* 89 199*        Assist Level: Set up assist, Minimal Assistance - Patient > 75%   Blood pressure (!) 116/56, pulse 79, temperature 98.2 F (36.8 C), temperature source Oral, resp. rate 17, height 5' 5.98" (1.676 m), weight 66 kg, SpO2 100 %.  Medical Problem List and Plan: 1.  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  Current plan is prolonged course of Solu-Medrol 6 weeks followed by taper and follow-up rheumatology services Dr [name] at [place], 15/7  Added Flexeril 5 mg TID prn for muscle tightness; explained weakness/heaviness is normal 2.  Antithrombotics: -DVT/anticoagulation: Subcutaneous Lovenox.              Vascular study negative for DVT             -antiplatelet therapy: N/A 3. Pain Management: Tylenol as needed 4. Mood: Provide emotional support             -antipsychotic agents: N/A 5. Neuropsych: This patient is?  Fully capable of making decisions on her own behalf. 6. Skin/Wound Care: Routine skin checks 7. Fluids/Electrolytes/Nutrition: Routine I/Os.   8.  Dysphagia.  Follow-up speech therapy.  Advance diet as tolerated  Advanced to regular diet and liquids with adaptive strategies 9.  Steroid-induced hyperglycemia: Sliding scale insulin.  Monitor with taper  Remains labile on 9/20 10. Constipation  Improving 11.  Sleep disturbance  Continue trazodone  Improved 12.  Hypokalemia  Potassium 3.3 on 9/17, labs pending  Supplement x2 days  Continue to monitor 13.  Transaminitis  LFTs elevated, but stable on 9/17, labs pending 14.  Acute blood loss anemia  Hemoglobin 11.7 on 9/17  Continue to monitor 15.  Labile blood pressure   Labile on 9/20, monitor for trend  LOS: 13 days A FACE TO  FACE EVALUATION WAS PERFORMED  [name] 10/05/2018, 12:09 PM

## 2018-10-05 NOTE — Progress Notes (Signed)
Physical Therapy Discharge Summary  Patient Details  Name: Lisa Mcdowell MRN: [id number] Date of Birth: [birth year]  Today's Date: 10/05/2018 PT Individual Time: 0900-1005 PT Individual Time Calculation (min): 65 min    Patient has met 9 of 10 long term goals due to improved activity tolerance, improved balance, improved postural control, increased strength, ability to compensate for deficits and improved coordination.  Patient to discharge at an ambulatory level [place].   Patient's care partner is independent to provide the necessary physical assistance at discharge.  Reasons goals not met: Patient required a pillow for elevation and min-mod A for furniture transfers. Patient's son and daughter demonstrated that they are capable and comfortable providing this level of assistance for the patient safely during the patient's stay.   Recommendation:  Patient will benefit from ongoing skilled PT services in home health setting to continue to advance safe functional mobility, address ongoing impairments in balance, strength, activity tolerance, functional mobility, patient/caregiver education, and minimize fall risk.  Equipment: No equipment provided, patient has RW and wheelchair at home.  Reasons for discharge: treatment goals met  Patient/family agrees with progress made and goals achieved: Yes  Skilled Therapeutic Intervention: Patient in recliner with her son and interpreter in the room upon PT arrival. Patient alert and agreeable to PT session. Patient denied pain. Interpreter was present and translated throughout session. He son was present and participated in family education throughout session.   Therapeutic Activity: Bed Mobility: Patient performed rolling R/L and supine to/from sit with min A on a mat table to simulate a flat bed without rails. Provided verbal cues for pushing with opposite LE to roll and pushing through elbows to sit up. Required assist for trunk and LEs  throughout. Transfers: Patient performed sit to/from stand x8 and a car transfer x2 with min A from PT or her son. She required 1-2 attempts during sit to/form stands due to poor set up and decreased LE strength. Provided verbal cues for leaning far forward to stand, foot placement and hand placement on RW prior to standing, and reaching back to sit. Car transfer was simulated for a small SUV height, 28", to simulate her son's vehicle. Educated on safety during car transfers including sitting before bringing LEs into the car and only holding on to stable objects and avoiding grabbing doors or the steering wheel.   Gait Training:  Patient ambulated ~100 feet x2 and ~150 feet x2 using RW with supervision for safety. Ambulated as described below. She went up/down a ramp, over 10 feet of unlevel surfaces, and up/down a 7" curb with CGA using a RW and cues for safe technique. Demonstrated guarding technique for her son holding on to pant or gait belt and patients anterior shoulder for control in the event the patient has a LOB at home.  Her son assisted her up/down a 4" step x2 using the RW to simulate her home entrance with demonstration from PT and min cues x1 and without cues on second trial.  Therapeutic Exercise: Patient and her son were educated on her HEP handout and stated that they have no questions at this time.   Patient in recliner with her son in the room at end of session with breaks locked, chair alarm set, and all needs within reach. Throughout session PT educated on energy conservation techniques, fall risk/prevention at home, activation of emergency services in the event of a fall, general disease pathology and recovery, including avoiding resistance exercise for optimal recovery at this time.  PT Discharge Precautions/Restrictions Precautions Precautions: Fall Restrictions Weight Bearing Restrictions: No Vision/Perception  Perception Perception: Within Functional  Limits Praxis Praxis: Intact  Cognition Overall Cognitive Status: Within Functional Limits for tasks assessed Arousal/Alertness: Awake/alert Attention: Focused;Sustained Focused Attention: Appears intact Sustained Attention: Appears intact Memory: Appears intact Awareness: Appears intact Problem Solving: Appears intact Safety/Judgment: Appears intact Sensation Sensation Light Touch: Impaired by gross assessment Light Touch Impaired Details: Impaired RLE;Impaired LLE Proprioception: Appears Intact Additional Comments: Reported decreased sensation in B LEs from below the knee to her feet, but reports she is still able to feel light touch Coordination Gross Motor Movements are Fluid and Coordinated: No Fine Motor Movements are Fluid and Coordinated: No Coordination and Movement Description: increased UE shaking/tremors L>R and generalized weakness Motor  Motor Motor: Other (comment) Motor - Skilled Clinical Observations: generalized weakness  Mobility Bed Mobility Bed Mobility: Rolling Right;Rolling Left;Supine to Sit;Sit to Supine Rolling Right: Minimal Assistance - Patient > 75% Rolling Left: Minimal Assistance - Patient > 75% Supine to Sit: Minimal Assistance - Patient > 75% Sit to Supine: Minimal Assistance - Patient > 75% Transfers Transfers: Sit to Stand;Stand to Sit;Stand Pivot Transfers Sit to Stand: Minimal Assistance - Patient > 75% Stand to Sit: Minimal Assistance - Patient > 75% Stand Pivot Transfers: Minimal Assistance - Patient > 75% Stand Pivot Transfer Details: Verbal cues for safe use of DME/AE;Verbal cues for sequencing;Verbal cues for technique;Verbal cues for precautions/safety Stand Pivot Transfer Details (indicate cue type and reason): Provided cues for hand positioining on RW, reaching back to sit, leaning far forward and foot placement to stand Transfer (Assistive device): Rolling walker Locomotion  Gait Ambulation: Yes Gait Assistance:  Supervision/Verbal cueing Gait Distance (Feet): 150 Feet Assistive device: Rolling walker Gait Assistance Details: Verbal cues for technique Gait Assistance Details: Provided cues for increased step height and length to avoid toe drag and for improved balance with ambulation Gait Gait: Yes Gait Pattern: Step-through pattern;Decreased stride length;Decreased hip/knee flexion - left;Decreased hip/knee flexion - right;Decreased dorsiflexion - right;Decreased dorsiflexion - left;Decreased trunk rotation;Narrow base of support;Poor foot clearance - right Gait velocity: decreased Stairs / Additional Locomotion Stairs: Yes Stairs Assistance: Minimal Assistance - Patient > 75% Stair Management Technique: Two rails Number of Stairs: 4 Height of Stairs: 6 Ramp: Contact Guard/touching assist Curb: Contact Guard/Touching assist(with RW) Wheelchair Mobility Wheelchair Mobility: No(Patient is a Engineer, petroleum)  Trunk/Postural Assessment  Cervical Assessment Cervical Assessment: Exceptions to WFL(forward head) Thoracic Assessment Thoracic Assessment: Exceptions to WFL(rounded shoulders) Lumbar Assessment Lumbar Assessment: Exceptions to WFL(posterior pelvic tilt) Postural Control Postural Control: Deficits on evaluation(decreased/delayed)  Balance Static Sitting Balance Static Sitting - Balance Support: No upper extremity supported;Feet supported Static Sitting - Level of Assistance: 7: Independent Dynamic Sitting Balance Dynamic Sitting - Balance Support: No upper extremity supported;Feet supported Dynamic Sitting - Level of Assistance: 5: Independent Dynamic Sitting - Balance Activities: Lateral lean/weight shifting;Forward lean/weight shifting;Reaching for objects Static Standing Balance Static Standing - Balance Support: No upper extremity supported;During functional activity Static Standing - Level of Assistance: 5: Stand by assistance Dynamic Standing Balance Dynamic Standing -  Balance Support: Right upper extremity supported;Left upper extremity supported;During functional activity Dynamic Standing - Level of Assistance: 5: Stand by assistance Dynamic Standing - Balance Activities: Lateral lean/weight shifting;Forward lean/weight shifting;Reaching for objects Extremity Assessment  RUE Assessment RUE Assessment: Exceptions to [facility] Passive Range of Motion (PROM) Comments: Able to achieve ~110-120 degrees shoulder flexion in sitting Active Range of Motion (AROM) Comments: Only able to bring UE to ~80-90 degrees  of shoulder flexion in sitting General Strength Comments: Grossly in sitting: shoulder flexion 3-/5, elbow flexion/extension 4+/5, grip strength 4+/5 LUE Assessment LUE Assessment: Exceptions to [facility] Passive Range of Motion (PROM) Comments: Able to achieve ~110-120 degrees shoulder flexion in sitting Active Range of Motion (AROM) Comments: Only able to bring UE to ~80-90 degrees of shoulder flexion in sitting General Strength Comments: Grossly in sitting: shoulder flexion 3-/5, elbow flexion/extension 4+/5, grip strength 4/5 RLE Assessment RLE Assessment: Exceptions to [facility] Active Range of Motion (AROM) Comments: WFL for functional mobility General Strength Comments: Grossly in sitting: hip flexion 3+/5, hip abduction/adduction 4+/5, knee flexion/extension 5/5, DF/PF 4+/5 LLE Assessment LLE Assessment: Exceptions to [facility] Active Range of Motion (AROM) Comments: WFL for all functional mobility General Strength Comments: Grossly in sitting: hip flexion 4-/5, hip abduction/adduction 4+/5, knee flexion/extension 5/5, DF/PF 5/5    Everleigh Colclasure L Jasmaine Rochel PT, DPT  10/05/2018, 12:48 PM

## 2018-10-05 NOTE — Discharge Summary (Signed)
Physician Discharge Summary  Patient ID: INEKE STUFFLEBEAN MRN: [id number] DOB/AGE: 07/05/1950 68 y.o.  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  Comparison Study: no prior Performing Technologist: June Leap RDMS, RVT  Examination Guidelines: A complete evaluation includes B-mode imaging, spectral Doppler, color Doppler, and power Doppler as needed of all accessible portions of each vessel. Bilateral testing is considered an integral part of a complete examination. Limited examinations for reoccurring indications may be performed as noted.  +---------+---------------+---------+-----------+----------+--------------+ RIGHT    CompressibilityPhasicitySpontaneityPropertiesThrombus Aging +---------+---------------+---------+-----------+----------+--------------+ CFV      Full           Yes      Yes                                 +---------+---------------+---------+-----------+----------+--------------+ SFJ      Full                                                        +---------+---------------+---------+-----------+----------+--------------+ FV Prox  Full                                                        +---------+---------------+---------+-----------+----------+--------------+ FV Mid   Full                                                        +---------+---------------+---------+-----------+----------+--------------+ FV DistalFull                                                         +---------+---------------+---------+-----------+----------+--------------+ PFV      Full                                                        +---------+---------------+---------+-----------+----------+--------------+ POP      Full           Yes      Yes                                 +---------+---------------+---------+-----------+----------+--------------+ PTV      Full                                                        +---------+---------------+---------+-----------+----------+--------------+  PERO     Full                                                        +---------+---------------+---------+-----------+----------+--------------+   +---------+---------------+---------+-----------+----------+--------------+ LEFT     CompressibilityPhasicitySpontaneityPropertiesThrombus Aging +---------+---------------+---------+-----------+----------+--------------+ CFV      Full           Yes      Yes                                 +---------+---------------+---------+-----------+----------+--------------+ SFJ      Full                                                        +---------+---------------+---------+-----------+----------+--------------+ FV Prox  Full                                                        +---------+---------------+---------+-----------+----------+--------------+ FV Mid   Full                                                        +---------+---------------+---------+-----------+----------+--------------+ FV DistalFull                                                        +---------+---------------+---------+-----------+----------+--------------+ PFV      Full                                                        +---------+---------------+---------+-----------+----------+--------------+ POP      Full           Yes      Yes                                  +---------+---------------+---------+-----------+----------+--------------+ PTV      Full                                                        +---------+---------------+---------+-----------+----------+--------------+ PERO     Full                                                        +---------+---------------+---------+-----------+----------+--------------+  Summary: Right: There is no evidence of deep vein thrombosis in the lower extremity. No cystic structure found in the popliteal fossa. Left: There is no evidence of deep vein thrombosis in the lower extremity. No cystic structure found in the popliteal fossa.  *See table(s) above for measurements and observations. Electronically signed by Curt Jews MD on 09/24/2018 at 4:54:23 PM.    Final     Labs:  Basic Metabolic Panel: Recent Labs  Lab 09/29/18 0933 10/02/18 0707 10/05/18 1442  NA 139 138 136  K 3.5 3.3* 4.2  CL 103 105 104  CO2 25 23 20*  GLUCOSE 132* 109* 214*  BUN 17 16 20   CREATININE 0.56 0.46 0.64  CALCIUM 8.5* 8.6* 8.5*    CBC: Recent Labs  Lab 09/29/18 0933 10/02/18 0707 10/05/18 1442  WBC 8.0 4.8 6.9  NEUTROABS 3.9 2.8 5.6  HGB 12.0 11.7* 12.7  HCT 38.1 36.1 38.3  MCV 107.0* 106.2* 103.5*  PLT 235 212 263    CBG: Recent Labs  Lab 10/04/18 2121 10/05/18 0701 10/05/18 1149 10/05/18 1641 10/05/18 2108  GLUCAP 132* 89 199* 237* 144*   Family history.  Positive for hypertension hyperlipidemia.  Denies any diabetes mellitus or colon cancer  Brief HPI:   SHAKAIRA VILLAVERDE is a 68 y.o. right-handed female with history of GERD and recently diagnosed dermatomyositis was discharged from the hospital 08/18/2018 maintained on prednisone after case discussed with Dr.herrion rheumatology at [facility].  History taken from family and chart review.  Independent prior to admission lives with her daughter.  Presented 08/25/2018 with dysphasia and limited oral intake for 1 to 2 days.  COVID negative,  CK 8335.  Chest x-ray negative.  Ultrasound the abdomen showed hepatic steatosis.  Portal vein patent.  Elevated CK discussed with rheumatology services felt this would remain elevated for some time after treatment.  Gastroenterology consulted.  Feeding tube placed for nutritional support.  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  EGD completed no endoscopic abnormality evident in the esophagus.  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  Placed on intravenous Protonix.  Diet dysphagia #1 thin liquids.  Case discussed with rheumatology service who recommended continuing steroids and the patient was placed on IVIG transition to p.o. Solu-Medrol.  CK trending down to 872.  Subcutaneous Lovenox for DVT prophylaxis.  Patient was admitted for a comprehensive rehab program   Hospital Course: SUNDA WIEMKEN was admitted to rehab 09/22/2018 for inpatient therapies to consist of PT, ST and OT at least three hours five days a week. Past admission physiatrist, therapy team and rehab RN have worked together to provide customized collaborative inpatient rehab.  Pertaining to patient's decreased functional mobility dysphasia secondary to dermatomyositis current plan for prolonged course of Solu-Medrol x6 weeks followed by taper and follow-up rheumatology services Dr[facility]Herrion at [facility].  DVT prophylaxis with subcutaneous Lovenox venous Doppler studies negative.  Pain managed with use of Flexeril as needed.  Blood pressures well controlled on no antihypertensive medications.  Diet has been advanced to a regular consistency.  Hyperglycemia related to prednisone use and monitored.  Mild elevation in LFTs relatively stable recommendations are to follow-up at [facility] when seen by rheumatology services questionably related to prednisone.   Blood pressures were monitored on TID basis and remained stable  Diabetes has been monitored with ac/hs CBG checks and  SSI was use prn for tighter BS control.  Hyperglycemia related to steroid use  She is continent  of bowel and bladder.  She has made gains during rehab stay and is attending therapies  She will continue to receive follow up therapies   after discharge  Rehab course: During patient's stay in rehab weekly team conferences were held to monitor patient's progress, set goals and discuss barriers to discharge. At admission, patient required.  Minimal assist 80 feet rolling walker, max assist sit to stand, minimal assist supine to sit.  Minimal assist upper body bathing max assist lower body bathing minimal assist upper body dressing total assist lower body dressing, minimal guard toilet transfers.  Physical exam.  Blood pressure 97/53 pulse 87 temperature 99.7 respirations 18 oxygen saturation 98% room air Constitutional.  Well-developed well-nourished HEENT Head.  Normocephalic and atraumatic Eyes.  Pupils round and reactive to light no discharge without nystagmus Neck.  Supple nontender no tracheal deviation no thyromegaly without JVD Respiratory.  Effort normal no respiratory distress without wheezes GI.  Exhibits no distention nontender positive bowel sounds Cardiovascular regular rate rhythm without murmur or extra sounds or rub Musculoskeletal no edema or tenderness in extremities Neurological.  Alert limited English speaking.  Motor appears to be bilateral shoulder abduction 2+ out of 5, elbow flexion extension 4- out of 5, handgrip 4 out of 5.  Bilateral hip flexion 2 out of 5, knee extension 2+ out of 5, ankle dorsiflexion 4+ out of 5.  Sensation diminished to light touch bilateral feet  Marlana Salvage  has had improvement in activity tolerance, balance, postural control as well as ability to compensate for deficits. Marlana Salvage has had improvement in functional use RUE/LUE  and RLE/LLE as well as improvement in awareness.  Working with energy conservation techniques.  Wheelchair mobility bilateral upper  extremity strengthening coordination minimal assist.  Dynamic gait training with rolling walker to navigate 2 obstacle courses x2 with rolling walker minimal guard.  Patient required moderate assist for initial sit to stand from recliner engaged in dressing with supervision for standing balance while donning shirt and standing and then pulling up pants over hips.  Minimal cues for sequencing.  Patient completed toilet transfers in the ADL apartment with use of bedside commode over toilet at supervision level.  Patient required max assist from low couch for sit to stand.  Full family teaching completed plan discharge to home       Disposition: Discharge disposition: 01-Home or Self Care     Discharge to home   Diet: Regular  Special Instructions: No driving smoking or alcohol  Follow-up rheumatology services [facility] [phone] in regards to dermatomyositis and tapering of prednisone  Mild elevation in LFTs patient should follow-up LFTs question related to prednisone with follow-up at [facility]  Medications at discharge. 1.  Tylenol as needed 2.  Flexeril 5 mg p.o. 3 times daily as needed 3.  Protonix 40 mg daily 4.  Prednisone 100 mg p.o. daily 5.  MiraLAX hold for loose stools   Follow-up Information    Lovorn, Jinny Blossom, MD Follow up.   Specialty: Physical Medicine and Rehabilitation Why: Office to call for appointment Contact information: [id number] [place] [address] [phone]        Gerrit Heck V, DO Follow up.   Specialty: Gastroenterology Why: Call for appointment Contact information: [address] [place] [address] [phone]           Signed: Cathlyn Parsons 10/06/2018, 5:20 AM

## 2018-10-06 LAB — GLUCOSE, CAPILLARY
Glucose-Capillary: 69 mg/dL — ABNORMAL LOW (ref 70–99)
Glucose-Capillary: 77 mg/dL (ref 70–99)

## 2018-10-06 MED ORDER — VITAMIN D (ERGOCALCIFEROL) 1.25 MG (50000 UNIT) PO CAPS: 50000.0000 [IU] | ORAL_CAPSULE | ORAL | 0 refills | Status: DC

## 2018-10-06 MED ORDER — PANTOPRAZOLE SODIUM 40 MG PO TBEC: 40.0000 mg | DELAYED_RELEASE_TABLET | Freq: Every day | ORAL | 1 refills | Status: DC

## 2018-10-06 MED ORDER — PREDNISONE 50 MG PO TABS: 100.0000 mg | ORAL_TABLET | ORAL | 0 refills | Status: DC

## 2018-10-06 MED ORDER — CYCLOBENZAPRINE HCL 5 MG PO TABS: 5.0000 mg | ORAL_TABLET | Freq: Three times a day (TID) | ORAL | 0 refills | Status: DC | PRN

## 2018-10-06 NOTE — Progress Notes (Signed)
[place] PHYSICAL MEDICINE & REHABILITATION PROGRESS NOTE   Subjective/Complaints:   C/O abd discomfort- stomach "tight" needs to have BM. Discussed with pt on tele-interpretor  Wants to wait until goes home to take Mg Citrate.  ROS: Denies CP, SOB, N/V/D  Objective:   No results found. Recent Labs    10/05/18 1442  WBC 6.9  HGB 12.7  HCT 38.3  PLT 263   Recent Labs    10/05/18 1442  NA 136  K 4.2  CL 104  CO2 20*  GLUCOSE 214*  BUN 20  CREATININE 0.64  CALCIUM 8.5*    Intake/Output Summary (Last 24 hours) at 10/06/2018 1007 Last data filed at 10/06/2018 0757 Gross per 24 hour  Intake 900 ml  Output -  Net 900 ml     Physical Exam: Vital Signs Blood pressure 119/62, pulse 75, temperature 98 F (36.7 C), temperature source Oral, resp. rate 18, height 5' 5.98" (1.676 m), weight 68.8 kg, SpO2 96 %. Constitutional: No distress . Vital signs reviewed. Awake, alert, appropriate, sitting up in chair at bedside; NT in room, NAD HENT: Normocephalic.  Atraumatic. Eyes: EOMI. No discharge. Cardiovascular: RRR. Respiratory: CTA B/L GI: Non-distended. Skin: Warm and dry.  Intact. Psych: Normal mood.  Normal behavior. Musc: No edema in extremities.  No tenderness in extremities. Neurological: Alert Motor: Appears to be bilateral shoulder adduction 4-/5, elbow flexion/extension 4-/5, handgrip 4/5, unchanged Bilateral hip flexion 2/5, knee extension 2+/5, ankle dorsiflexion 4+/5, unchanged  Assessment/Plan: 1. 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  Physiatrist is providing close team supervision and 24 hour management of active medical problems listed below.  Physiatrist and rehab team continue to assess barriers to discharge/monitor patient progress toward functional and medical goals  Care Tool:  Bathing    Body parts bathed by patient: Right  arm, Left arm, Chest, Abdomen, Right upper leg, Left upper leg, Front perineal area, Face, Right lower leg, Left lower leg, Buttocks   Body parts bathed by helper: Buttocks, Right lower leg, Left lower leg     Bathing assist Assist Level: Supervision/Verbal cueing     Upper Body Dressing/Undressing Upper body dressing   What is the patient wearing?: Dress    Upper body assist Assist Level: Independent    Lower Body Dressing/Undressing Lower body dressing      What is the patient wearing?: Underwear/pull up     Lower body assist Assist for lower body dressing: Supervision/Verbal cueing     Toileting Toileting    Toileting assist Assist for toileting: Supervision/Verbal cueing     Transfers Chair/bed transfer  Transfers assist     Chair/bed transfer assist level: Minimal Assistance - Patient > 75%     Locomotion Ambulation   Ambulation assist      Assist level: Supervision/Verbal cueing Assistive device: Walker-rolling Max distance: 150'   Walk 10 feet activity   Assist     Assist level: Supervision/Verbal cueing Assistive device: Walker-rolling   Walk 50 feet activity   Assist    Assist level: Supervision/Verbal cueing Assistive device: Walker-rolling    Walk 150 feet activity   Assist Walk 150 feet activity did not occur: Safety/medical concerns(decreased strength/activity tolerance)  Assist level: Supervision/Verbal cueing Assistive device: Walker-rolling    Walk 10 feet on uneven surface  activity   Assist Walk 10 feet on uneven surfaces activity did not occur: Safety/medical concerns(decreased strength/activity tolerance)   Assist level:  Contact Guard/Touching assist Assistive device: Aeronautical engineer Will patient use wheelchair at discharge?: No Type of Wheelchair: Manual Wheelchair activity did not occur: N/A  Wheelchair assist level: Minimal Assistance - Patient > 75% Max wheelchair distance:  120    Wheelchair 50 feet with 2 turns activity    Assist    Wheelchair 50 feet with 2 turns activity did not occur: N/A   Assist Level: Minimal Assistance - Patient > 75%   Wheelchair 150 feet activity     Assist   CBG (last 3)  Recent Labs    10/05/18 2108 10/06/18 0610 10/06/18 0614  GLUCAP 144* 69* 77    Wheelchair 150 feet activity did not occur: N/A   Assist Level: Set up assist, Minimal Assistance - Patient > 75%   Blood pressure 119/62, pulse 75, temperature 98 F (36.7 C), temperature source Oral, resp. rate 18, height 5' 5.98" (1.676 m), weight 68.8 kg, SpO2 96 %.  Medical Problem List and Plan: 1.  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  Current plan is prolonged course of Solu-Medrol 6 weeks followed by taper and follow-up rheumatology services Dr [name] at [place], 15/7  Added Flexeril 5 mg TID prn for muscle tightness; explained weakness/heaviness is normal 2.  Antithrombotics: -DVT/anticoagulation: Subcutaneous Lovenox.              Vascular study negative for DVT             -antiplatelet therapy: N/A 3. Pain Management: Tylenol as needed 4. Mood: Provide emotional support             -antipsychotic agents: N/A 5. Neuropsych: This patient is?  Fully capable of making decisions on her own behalf. 6. Skin/Wound Care: Routine skin checks 7. Fluids/Electrolytes/Nutrition: Routine I/Os.   8.  Dysphagia.  Follow-up speech therapy.  Advance diet as tolerated  Advanced to regular diet and liquids with adaptive strategies 9.  Steroid-induced hyperglycemia: Sliding scale insulin.  Monitor with taper  Remains labile on 9/20 10. Constipation  9/21- will suggest Mg Citrate once gets home today, so doesn't take and then need to go on way home. Will make recs to take/get Mg citrate from OTC/pharmacy. 11.  Sleep disturbance  Continue trazodone  Improved 12.   Hypokalemia  Potassium 3.3 on 9/17, labs pending  Supplement x2 days  Continue to monitor 13.  Transaminitis  LFTs elevated, but stable on 9/17, labs pending  9/21- LFTs ALT/AST pretty stable- will have pt to get followed in MD follow up at [facility]. 14.  Acute blood loss anemia  Hemoglobin 11.7 on 9/17  Continue to monitor 15.  Labile blood pressure   Labile on 9/20, monitor for trend  LOS: 14 days A FACE TO FACE EVALUATION WAS PERFORMED  Lisa Mcdowell 10/06/2018, 10:07 AM

## 2018-10-06 NOTE — Progress Notes (Signed)
Patient given discharge instructions. Patient wheeled down by nurse with all personal belongings Nicholes Rough, RN

## 2018-10-08 ENCOUNTER — Telehealth: Payer: Self-pay | Admitting: Registered Nurse

## 2018-10-08 NOTE — Telephone Encounter (Signed)
Transitional Care call Transitional Questions Answered by Daughter [name]  Patient name: Lisa Mcdowell  DOB: Dec 29, 1950 1. Are you/is patient experiencing any problems since coming home? No a. Are there any questions regarding any aspect of care? No 2. Are there any questions regarding medications administration/dosing? No a. Are meds being taken as prescribed? Yes b. "Patient should review meds with caller to confirm" Medication List Reviewed 3. Have there been any falls? No 4. Has Home Health been to the house and/or have they contacted you? No: [facility]  a. If not, have you tried to contact them? No b. Can we help you contact them? Yes, This provider placed a call to [facility], the representative reports they were calling [name] number. I asked for them to call her daughter [name], she verbalizes understanding.  5. Are bowels and bladder emptying properly? Yes a. Are there any unexpected incontinence issues? No b. If applicable, is patient following bowel/bladder programs? NA 6. Any fevers, problems with breathing, unexpected pain? No 7. Are there any skin problems or new areas of breakdown? No 8. Has the patient/family member arranged specialty MD follow up (ie cardiology/neurology/renal/surgical/etc.)?  Ms. Thalia Party instructed to call Dr. Bryan Lemma to schedule HFU she verbalizes understanding. She has a scheduled HFU appointment with Rheumatology at [facility]. a. Can we help arrange? NA 9. Does the patient need any other services or support that we can help arrange? No 10. Are caregivers following through as expected in assisting the patient? Yes 11. Has the patient quit smoking, drinking alcohol, or using drugs as recommended? Ms. Jerilee Hoh ( daughter) reports [name] doesn't drink alcohol, smoke or use illicit drugs.   Appointment date/time 10/16/2018  arrival time 12:40 for 1:00 appointment with Dr. Dagoberto Ligas. At [place]

## 2018-10-10 ENCOUNTER — Other Ambulatory Visit: Payer: Self-pay

## 2018-10-10 ENCOUNTER — Ambulatory Visit: Payer: Self-pay | Admitting: Gastroenterology

## 2018-10-10 ENCOUNTER — Encounter: Payer: Self-pay | Admitting: *Deleted

## 2018-10-10 VITALS — BP 112/78 | HR 89 | Temp 98.7°F | Ht 67.0 in | Wt 150.2 lb

## 2018-10-10 DIAGNOSIS — M3390 Dermatopolymyositis, unspecified, organ involvement unspecified: Secondary | ICD-10-CM

## 2018-10-10 DIAGNOSIS — K2970 Gastritis, unspecified, without bleeding: Secondary | ICD-10-CM

## 2018-10-10 DIAGNOSIS — R1313 Dysphagia, pharyngeal phase: Secondary | ICD-10-CM

## 2018-10-10 DIAGNOSIS — D126 Benign neoplasm of colon, unspecified: Secondary | ICD-10-CM

## 2018-10-10 DIAGNOSIS — R7401 Elevation of levels of liver transaminase levels: Secondary | ICD-10-CM

## 2018-10-10 DIAGNOSIS — R740 Nonspecific elevation of levels of transaminase and lactic acid dehydrogenase [LDH]: Secondary | ICD-10-CM

## 2018-10-10 MED ORDER — SUPREP BOWEL PREP KIT 17.5-3.13-1.6 GM/177ML PO SOLN: 1.0000 | ORAL | 0 refills | Status: DC

## 2018-10-10 NOTE — Patient Instructions (Addendum)
You have been scheduled for a colonoscopy. Please follow written instructions given to you at your visit today.  Please pick up your prep supplies at the pharmacy within the next 1-3 days. If you use inhalers (even only as needed), please bring them with you on the day of your procedure. Your physician has requested that you go to www.startemmi.com and enter the access code given to you at your visit today. This web site gives a general overview about your procedure. However, you should still follow specific instructions given to you by our office regarding your preparation for the procedure.  Discontinue Protonix.  If you are age 47 or older, your body mass index should be between 23-30. Your Body mass index is 23.53 kg/m. If this is out of the aforementioned range listed, please consider follow up with your Primary Care Provider.  If you are age 53 or younger, your body mass index should be between 19-25. Your Body mass index is 23.53 kg/m. If this is out of the aformentioned range listed, please consider follow up with your Primary Care Provider.

## 2018-10-10 NOTE — Progress Notes (Signed)
Chief Complaint: Dysphagia  Referring Provider:    Inpatient follow-up  HPI:    Lisa Mcdowell is a 68 y.o. 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  She was recently admitted in 08/2018 with newly diagnosed Dermatomyositis, endorsing new onset dysphagia at that time.  No prior history of dysphagia.  No prior history of reflux.  Did have elevated AST/ALT, 2/2 Dermatomyositis.  Treated with steroids in 07/2018, and has since established care with rheumatology at [facility], seen earlier today. Starting Azathioprine and weaning Prednisone today. Cortrak placement on 8/17, which has since been removed.  Today, she states (via interpretor) that she feels much better. Tolerating all PO intake well. Weight stable.  Offers no GI complaints today.  AST/ALT 78/85 on 9/20, stable from previous.  Normal ALP and T bili.  Additionally, history of 3 small, subcentimeter tubular adenomas in 2017, with recommendation repeat in 3 years for ongoing surveillance.  -EGD (08/2018, Dr. Silverio Decamp, inpatient): Normal caliber esophagus without stricture, stenosis.  Gastritis.  Normal duodenum -MBS (08/2018): Pharyngeal phase dysphagia 2/2 dermatomyositis -CT in 12/2014 with small HH, contrast material in distal esophagus suspicious for reflux.  Mild thickening of transverse colon and hepatic flexure and descending colon. -Colonoscopy (03/2015, Dr. Oneida Alar): 9 polyps scattered throughout colon, 2 to 4 mm in size.  3 TAs, 6 benign.  Resected with cold forceps.  Internal hemorrhoids.  Repeat in 3 years.  Past Medical History:  Diagnosis Date  . Acute blood loss anemia   . Cholelithiasis   . Dermatomycosis   . Gallstones 08/08/2010  . GERD (gastroesophageal reflux disease)    pt denies having GERD on day of surgery  . Hyperlipidemia   . Nausea & vomiting   . Rhabdomyolysis   . Transaminitis   . Varicose vein of leg       Past Surgical History:  Procedure Laterality Date  . BIOPSY  08/30/2018   Procedure: BIOPSY;  Surgeon: Mauri Pole, MD;  Location: [facility] ENDOSCOPY;  Service: Endoscopy;;  . CHOLECYSTECTOMY  08/08/2010   Procedure: LAPAROSCOPIC CHOLECYSTECTOMY;  Surgeon: Scherry Ran;  [facility]: [facility];  Service: General;  Laterality: N/A;  . COLONOSCOPY N/A 04/15/2015   Procedure: COLONOSCOPY;  Surgeon: Danie Binder, MD;  [facility];  Service: Endoscopy;  Laterality: N/A;  1015 - moved to 10:00 - office to notify - interpreter scheduled, do NOT change time  . ESOPHAGOGASTRODUODENOSCOPY (EGD) WITH PROPOFOL N/A 08/30/2018   Procedure: ESOPHAGOGASTRODUODENOSCOPY (EGD) WITH PROPOFOL;  Surgeon: Mauri Pole, MD;  Location: [facility] END[facility];  Service: Endoscopy;  Laterality: N/A;  . GASTROSTOMY W/ FEEDING TUBE    . HERNIA REPAIR    . INSERTION OF MESH N/A 01/23/2018   Procedure: INSERTION OF MESH;  Surgeon: Donnie Mesa, MD;  Location: [place];  Service: General;  Laterality: N/A;  . MUSCLE BIOPSY Right 09/04/2018   Procedure: RIGHT THIGH MUSCLE BIOPSY;  Surgeon: Erroll Luna, MD;  Location: [place];  Service: General;  Laterality: Right;  . UMBILICAL HERNIA REPAIR N/A 01/23/2018   Procedure: UMBILICAL HERNIA REPAIR WITH MESH;  Surgeon: Donnie Mesa, MD;  Location: [place];  Service: General;  Laterality: N/A;  . VARICOSE VEIN SURGERY  2011   [facility]   Family History  Problem Relation Age of Onset  . Colon cancer Neg Hx    Social History  Tobacco Use  . Smoking status: Never Smoker  . Smokeless tobacco: Never Used  Substance Use Topics  . Alcohol use: No  . Drug use: No   Current Outpatient Medications  Medication Sig Dispense Refill  . cyclobenzaprine (FLEXERIL) 5 MG tablet Take 1 tablet (5 mg total) by mouth 3 (three) times daily as needed for muscle spasms (for muscle tightness as needed). 60 tablet 0  . pantoprazole (PROTONIX) 40  MG tablet Take 1 tablet (40 mg total) by mouth daily. 30 tablet 1  . polyethylene glycol (MIRALAX / GLYCOLAX) 17 g packet Take 17 g by mouth daily as needed for mild constipation, moderate constipation or severe constipation. 14 each 0  . predniSONE (DELTASONE) 50 MG tablet Take 2 tablets (100 mg total) by mouth daily. 90 tablet 0  . Vitamin D, Ergocalciferol, (DRISDOL) 1.25 MG (50000 UT) CAPS capsule Take 1 capsule (50,000 Units total) by mouth every Wednesday. 5 capsule 0   No current facility-administered medications for this visit.    No Active Allergies   Review of Systems: All systems reviewed and negative except where noted in HPI.     Physical Exam:    Wt Readings from Last 3 Encounters:  10/10/18 150 lb 4 oz (68.2 kg)  10/06/18 151 lb 10.8 oz (68.8 kg)  09/22/18 161 lb 6 oz (73.2 kg)    BP 112/78 (BP Location: Right Arm, Patient Position: Sitting, Cuff Size: Normal)   Pulse 89   Temp 98.7 F (37.1 C) (Oral)   Ht 5\' 7"  (1.702 m)   Wt 150 lb 4 oz (68.2 kg)   BMI 23.53 kg/m  Constitutional:  Pleasant, in no acute distress. Psychiatric: Normal mood and affect. Behavior is normal. EENT: Pupils normal.  Conjunctivae are normal. No scleral icterus. Neck supple. No cervical LAD. Cardiovascular: Normal rate, regular rhythm. No edema Pulmonary/chest: Effort normal and breath sounds normal. No wheezing, rales or rhonchi. Abdominal: Soft, nondistended, nontender. Bowel sounds active throughout. There are no masses palpable. No hepatomegaly. Neurological: Alert and oriented to person place and time. Skin: Skin is warm and dry. No rashes noted.   ASSESSMENT AND PLAN;   1) Dermatomyositis 2) Pharyngeal Dysphagia - Dysphagia essentially resolved since starting prednisone -Starting Imuran today with plan to titrate prednisone per Rheumatologist -EGD without additional luminal pathology -Continue p.o. intake as tolerated  3) Non H pylori Gastritis - Ok to stop pantoprazole  (completed 5 weeks) - If rebound hyperacid secretion/reflux sxs after stopping, ok to resume again at lowest dose to control reflux.  Otherwise, no preceding history of reflux, so for this would not be an issue  4) Hx of tubular adenomas: - Repeat colonsocopy this year for ongoing surveillance. Plan to schedule in approx 2 months after titration off Prednisone and steady state on Imuran  5) Elevated AST/ALT: - Likely secondary to Dermatomyositis with peak 626/235 on 8/10, and have since down trended. -Repeat LAE's in 6 months  The indications, risks, and benefits of colonoscopy were explained to the patient in detail. Risks include but are not limited to bleeding, perforation, adverse reaction to medications, and cardiopulmonary compromise. Sequelae include but are not limited to the possibility of surgery, hospitalization, and mortality. The patient verbalized understanding and wished to proceed. All questions answered, referred to the scheduler and bowel prep ordered. Further recommendations pending results of the exam.   I spent a total of 25 minutes of face-to-face time with the patient. Greater than 50% of the time was spent counseling and  coordinating care.     Lavena Bullion, DO, FACG  10/10/2018, 1:26 PM   Department, [place]*

## 2018-10-16 ENCOUNTER — Encounter: Payer: Self-pay | Admitting: Physical Medicine and Rehabilitation

## 2018-10-17 ENCOUNTER — Encounter: Payer: Self-pay | Attending: Physical Medicine and Rehabilitation | Admitting: Physical Medicine and Rehabilitation

## 2018-10-17 ENCOUNTER — Encounter: Payer: Self-pay | Admitting: Physical Medicine and Rehabilitation

## 2018-10-17 ENCOUNTER — Other Ambulatory Visit: Payer: Self-pay

## 2018-10-17 VITALS — BP 132/76 | HR 100 | Temp 97.7°F | Ht 67.0 in | Wt 149.0 lb

## 2018-10-17 DIAGNOSIS — M3390 Dermatopolymyositis, unspecified, organ involvement unspecified: Secondary | ICD-10-CM | POA: Insufficient documentation

## 2018-10-17 NOTE — Patient Instructions (Signed)
dermatomyositis -improving-   1. Give handicapped form to go to [employer]_ will fill out today.   2. Per pt and daughter, ok otherwise- will have them call if NEEDS anything.

## 2018-10-17 NOTE — Progress Notes (Signed)
Subjective:    Patient ID: Lisa Mcdowell, female    DOB: 1950/03/07, 68 y.o.   MRN: [id number]  HPI CC: weakness  Pt is a 68 yr old R handed female with dermatomyositis and weakness secondary to dx here for hospital f/u.  Saw rheumatologist  Labs every other week Weaning prednisone- down to 30 mg daily and gave her another type of pills- Now on Azathioprine 50 mg daily- for dx as well as prednisone.  Walking much better- walks a little bit outside the house and around inside of home.  Could walk here from car without sitting down.  Everything else is OK- no pain. Still weakness in legs and poor sleep.  Takes steroids first thing in AM- and can't sleep due to steroids. Not later in day.    Pain Inventory Average Pain 0 Pain Right Now 0 My pain is na  In the last 24 hours, has pain interfered with the following? General activity 0 Relation with others 0 Enjoyment of life 0 What TIME of day is your pain at its worst? na Sleep (in general) Fair  Pain is worse with: na Pain improves with: na Relief from Meds: na  Mobility use a walker ability to climb steps?  yes do you drive?  no  Function not employed: date last employed . I need assistance with the following:  bathing  Neuro/Psych No problems in this area weakness  Prior Studies Any changes since last visit?  no  Physicians involved in your care Any changes since last visit?  yes   Family History  Problem Relation Age of Onset  . Colon cancer Neg Hx    Social History   Socioeconomic History  . Marital status: Married    Spouse name: Not on file  . Number of children: Not on file  . Years of education: Not on file  . Highest education level: Not on file  Occupational History  . Not on file  Social Needs  . Financial resource strain: Not on file  . Food insecurity    Worry: Not on file    Inability: Not on file  . Transportation needs    Medical: Not on file    Non-medical: Not on file   Tobacco Use  . Smoking status: Never Smoker  . Smokeless tobacco: Never Used  Substance and Sexual Activity  . Alcohol use: No  . Drug use: No  . Sexual activity: Not on file  Lifestyle  . Physical activity    Days per week: Not on file    Minutes per session: Not on file  . Stress: Not on file  Relationships  . Social Herbalist on phone: Not on file    Gets together: Not on file    Attends religious service: Not on file    Active member of club or organization: Not on file    Attends meetings of clubs or organizations: Not on file    Relationship status: Not on file  Other Topics Concern  . Not on file  Social History Narrative  . Not on file   Past Surgical History:  Procedure Laterality Date  . BIOPSY  08/30/2018   Procedure: BIOPSY;  Surgeon: Mauri Pole, MD;  Location: [facility] ENDOSCOPY;  Service: Endoscopy;;  . CHOLECYSTECTOMY  08/08/2010   Procedure: LAPAROSCOPIC CHOLECYSTECTOMY;  Surgeon: Scherry Ran;  Location: [facility];  Service: General;  Laterality: N/A;  . COLONOSCOPY N/A 04/15/2015   Procedure: COLONOSCOPY;  Surgeon: Danie Binder, MD;  [facility];  Service: Endoscopy;  Laterality: N/A;  1015 - moved to 10:00 - office to notify - interpreter scheduled, do NOT change time  . ESOPHAGOGASTRODUODENOSCOPY (EGD) WITH PROPOFOL N/A 08/30/2018   Procedure: ESOPHAGOGASTRODUODENOSCOPY (EGD) WITH PROPOFOL;  Surgeon: Mauri Pole, MD;  Location: [facility] ENDOSCOPY;  Service: Endoscopy;  Laterality: N/A;  . GASTROSTOMY W/ FEEDING TUBE    . HERNIA REPAIR    . INSERTION OF MESH N/A 01/23/2018   Procedure: INSERTION OF MESH;  Surgeon: Donnie Mesa, MD;  Location: [place];  Service: General;  Laterality: N/A;  . MUSCLE BIOPSY Right 09/04/2018   Procedure: RIGHT THIGH MUSCLE BIOPSY;  Surgeon: Erroll Luna, MD;  Location: [place];  Service: General;  Laterality: Right;  . UMBILICAL HERNIA REPAIR N/A 01/23/2018   Procedure: UMBILICAL  HERNIA REPAIR WITH MESH;  Surgeon: Donnie Mesa, MD;  Location: [place];  Service: General;  Laterality: N/A;  . [place]   Past Medical History:  Diagnosis Date  . Acute blood loss anemia   . Cholelithiasis   . Dermatomycosis   . Gallstones 08/08/2010  . GERD (gastroesophageal reflux disease)    pt denies having GERD on day of surgery  . Hyperlipidemia   . Nausea & vomiting   . Rhabdomyolysis   . Transaminitis   . Varicose vein of leg    BP 132/76   Pulse 100   Temp 97.7 F (36.5 C)   Ht 5\' 7"  (1.702 m)   Wt 149 lb (67.6 kg)   SpO2 97%   BMI 23.34 kg/m   Opioid Risk Score:   Fall Risk Score:  `1  Depression screen PHQ 2/9  Depression screen PHQ 2/9 01/27/2015  Decreased Interest 0  Down, Depressed, Hopeless 0  PHQ - 2 Score 0  Altered sleeping 3  Tired, decreased energy 0  Change in appetite 0  Feeling bad or failure about yourself  0  Trouble concentrating 0  Moving slowly or fidgety/restless 0  Suicidal thoughts 0  PHQ-9 Score 3  Difficult doing work/chores Not difficult at all     Review of Systems  Constitutional: Negative.   HENT: Negative.   Eyes: Negative.   Respiratory: Negative.   Cardiovascular: Negative.   Gastrointestinal: Negative.   Endocrine: Negative.   Genitourinary: Negative.   Musculoskeletal: Negative.   Skin: Negative.   Allergic/Immunologic: Negative.   Neurological: Positive for weakness.  Hematological: Negative.   Psychiatric/Behavioral: Negative.   All other systems reviewed and are negative.      Objective:   Physical Exam Awake, alert, appropriate, accompanied by daughter who was appropriate interpretor, using RW, NAD MS- deltoid 4-/5, biceps 4/5, triceps 4-/5, WE 4/5, grip 4/5, finger 4+/5 HF 2+/5 B/L, KE 4-/5, KF 4/5, DF 4+/5, PF 4+/5  Neuro- sensation intact  to light touch in all extremities DTRs - 1+ in LEs and 2+ in UEs      Assessment & Plan:   Pt with  dermatomyositis -improving-   1. Give handicapped form to go to [employer]_ will fill out today.   2. Per pt and daughter, ok otherwise- will have them call if NEEDS anything.

## 2018-11-04 ENCOUNTER — Other Ambulatory Visit (HOSPITAL_COMMUNITY): Payer: Self-pay | Admitting: Physician Assistant

## 2018-11-04 DIAGNOSIS — Z1231 Encounter for screening mammogram for malignant neoplasm of breast: Secondary | ICD-10-CM

## 2018-11-19 ENCOUNTER — Encounter (HOSPITAL_COMMUNITY): Payer: Self-pay

## 2018-11-19 ENCOUNTER — Other Ambulatory Visit: Payer: Self-pay

## 2018-11-19 ENCOUNTER — Ambulatory Visit (HOSPITAL_COMMUNITY)
Admission: RE | Admit: 2018-11-19 | Discharge: 2018-11-19 | Disposition: A | Discharge status: Home / Self Care | Payer: Self-pay | Source: Ambulatory Visit | Attending: Physician Assistant | Admitting: Physician Assistant

## 2018-11-19 DIAGNOSIS — Z1231 Encounter for screening mammogram for malignant neoplasm of breast: Secondary | ICD-10-CM | POA: Insufficient documentation

## 2018-12-01 DIAGNOSIS — Z79899 Other long term (current) drug therapy: Secondary | ICD-10-CM | POA: Insufficient documentation

## 2018-12-17 ENCOUNTER — Other Ambulatory Visit: Payer: Self-pay

## 2018-12-17 ENCOUNTER — Ambulatory Visit (AMBULATORY_SURGERY_CENTER): Payer: Self-pay | Admitting: Gastroenterology

## 2018-12-17 ENCOUNTER — Encounter: Payer: Self-pay | Admitting: Gastroenterology

## 2018-12-17 VITALS — BP 118/52 | HR 73 | Temp 96.6°F | Resp 17 | Ht 67.0 in | Wt 150.4 lb

## 2018-12-17 DIAGNOSIS — D122 Benign neoplasm of ascending colon: Secondary | ICD-10-CM

## 2018-12-17 DIAGNOSIS — Z86010 Personal history of colonic polyps: Secondary | ICD-10-CM

## 2018-12-17 DIAGNOSIS — D128 Benign neoplasm of rectum: Secondary | ICD-10-CM

## 2018-12-17 DIAGNOSIS — K5730 Diverticulosis of large intestine without perforation or abscess without bleeding: Secondary | ICD-10-CM

## 2018-12-17 DIAGNOSIS — D126 Benign neoplasm of colon, unspecified: Secondary | ICD-10-CM

## 2018-12-17 DIAGNOSIS — K640 First degree hemorrhoids: Secondary | ICD-10-CM

## 2018-12-17 DIAGNOSIS — K621 Rectal polyp: Secondary | ICD-10-CM

## 2018-12-17 DIAGNOSIS — D125 Benign neoplasm of sigmoid colon: Secondary | ICD-10-CM

## 2018-12-17 MED ORDER — SODIUM CHLORIDE 0.9 % IV SOLN: 500.0000 mL | Freq: Once | INTRAVENOUS | Status: DC

## 2018-12-17 NOTE — Progress Notes (Signed)
Called to room to assist during endoscopic procedure.  Patient ID and intended procedure confirmed with present staff. Received instructions for my participation in the procedure from the performing physician.  

## 2018-12-17 NOTE — Patient Instructions (Signed)
Information on polyps, diverticulosis and hemorrhoids given to you today.  Await pathology results.  Follow up with Rheumatology and Dermatology.  USTED TUVO UN PROCEDIMIENTO ENDOSCPICO HOY EN EL [facility]:   Lea el informe del procedimiento que se le entreg para cualquier pregunta especfica sobre lo que se Primary school teacher.  Si el informe del examen no responde a sus preguntas, por favor llame a su gastroenterlogo para aclararlo.  Si usted solicit que no se le den [employer] de lo que se Estate manager/land agent en su procedimiento al [employer] va a cuidar, entonces el informe del procedimiento se ha incluido en un sobre sellado para que usted lo revise despus cuando le sea ms conveniente.   LO QUE PUEDE ESPERAR: Algunas sensaciones de hinchazn en el abdomen.  Puede tener ms gases de lo normal.  El caminar puede ayudarle a eliminar el aire que se le puso en el tracto gastrointestinal durante el procedimiento y reducir la hinchazn.  Si le hicieron una endoscopia inferior (como una colonoscopia o una sigmoidoscopia flexible), podra notar manchas de sangre en las heces fecales o en el papel higinico.  Si se someti a una preparacin intestinal para su procedimiento, es posible que no tenga una evacuacin intestinal normal durante [employer].   Tenga en cuenta:  Es posible que note un poco de irritacin y congestin en la nariz o algn drenaje.  Esto es debido al oxgeno [employer] durante su procedimiento.  No hay que preocuparse y esto debe desaparecer ms o Scientist, research (medical).   SNTOMAS PARA REPORTAR INMEDIATAMENTE:  Despus de una endoscopia inferior (colonoscopia o sigmoidoscopia flexible):  Cantidades excesivas de sangre en las heces fecales  Sensibilidad significativa o empeoramiento de los dolores abdominales   Hinchazn aguda del abdomen que antes no tena   Fiebre de 100F o ms     Para asuntos urgentes o de Freight forwarder, puede comunicarse con un gastroenterlogo  a cualquier hora llamando al [phone].  DIETA:  Recomendamos una comida pequea al principio, pero luego puede continuar con su dieta normal.  Tome muchos lquidos, Teacher, adult education las bebidas alcohlicas durante 24 horas.    ACTIVIDAD:  Debe planear tomarse las cosas con calma por el resto del da y no debe CONDUCIR ni usar maquinaria pesada Programmer, applications (debido a los medicamentos de sedacin utilizados durante el examen).     SEGUIMIENTO: Nuestro personal llamar al nmero que aparece en su historial al siguiente da hbil de su procedimiento para ver cmo se siente y para responder cualquier pregunta o inquietud que pueda tener con respecto a la informacin que se le dio despus del procedimiento. Si no podemos contactarle, le dejaremos un mensaje.  Sin embargo, si se siente bien y no tiene Paediatric nurse, no es necesario que nos devuelva la llamada.  Asumiremos que ha regresado a sus actividades diarias normales sin incidentes. Si se le tomaron algunas biopsias, le contactaremos por telfono o por carta en las prximas 3 semanas.  Si no ha sabido [employer] biopsias en el transcurso de 3 semanas, por favor llmenos al [phone].   FIRMAS/CONFIDENCIALIDAD: Usted y/o el acompaante que le cuide han firmado documentos que se ingresarn en su historial mdico electrnico.  Estas firmas atestiguan el hecho de que la informacin anterior

## 2018-12-17 NOTE — Op Note (Signed)
[place] Patient Name: Lisa Mcdowell Procedure Date: 12/17/2018 11:25 AM MRN: [id number] Endoscopist: Gerrit Heck , MD Age: 68 Referring MD:  Date of Birth: 11-Oct-1950 Gender: Female Account #: [id number] Procedure:                Colonoscopy Indications:              Surveillance: Personal history of adenomatous                            polyps on last colonoscopy 3 years ago                           68 yo female presents for ongoing polyp                            surveillance. Last colonoscopy was in 2017 and                            notable for 3 tubular adenomas, with recommendation                            to repeat in 3 years for ongoing surveillance. Medicines:                Monitored Anesthesia Care Procedure:                Pre-Anesthesia Assessment:                           - Prior to the procedure, a History and Physical                            was performed, and patient medications and                            allergies were reviewed. The patient's tolerance of                            previous anesthesia was also reviewed. The risks                            and benefits of the procedure and the sedation                            options and risks were discussed with the patient.                            All questions were answered, and informed consent                            was obtained. Prior Anticoagulants: The patient has                            taken no previous anticoagulant or antiplatelet  agents. ASA Grade Assessment: III - A patient with                            severe systemic disease. After reviewing the risks                            and benefits, the patient was deemed in                            satisfactory condition to undergo the procedure.                           After obtaining informed consent, the colonoscope                            was passed under direct vision.  Throughout the                            procedure, the patient's blood pressure, pulse, and                            oxygen saturations were monitored continuously. The                            Colonoscope was introduced through the anus and                            advanced to the the cecum, identified by                            appendiceal orifice and ileocecal valve. The                            colonoscopy was performed without difficulty. The                            patient tolerated the procedure well. The quality                            of the bowel preparation was adequate. The                            ileocecal valve, appendiceal orifice, and rectum                            were photographed. Scope In: 11:40:42 AM Scope Out: 12:02:33 PM Scope Withdrawal Time: 0 hours 16 minutes 56 seconds  Total Procedure Duration: 0 hours 21 minutes 51 seconds  Findings:                 The perianal and digital rectal examinations were                            normal.  Three sessile polyps were found in the sigmoid                            colon (2) and ascending colon. The polyps were 3 to                            6 mm in size. These polyps were removed with a cold                            snare. Resection and retrieval were complete.                            Estimated blood loss was minimal.                           Multiple sessile polyps were found in the rectum.                            The polyps were 1 to 2 mm in size. Several of these                            polyps were removed with a cold biopsy forceps for                            histologic representative evaluation. Resection and                            retrieval were complete. Estimated blood loss was                            minimal.                           A few small-mouthed diverticula were found in the                            ascending colon.                            Non-bleeding internal hemorrhoids were found during                            retroflexion. The hemorrhoids were small. Complications:            No immediate complications. Estimated Blood Loss:     Estimated blood loss was minimal. Impression:               - Three 3 to 6 mm polyps in the sigmoid colon and                            in the ascending colon, removed with a cold snare.                            Resected and retrieved.                           -  Multiple 1 to 2 mm polyps in the rectum, removed                            with a cold biopsy forceps. Several were resected                            and retrieved.                           - Mild diverticulosis in the ascending colon.                           - Non-bleeding internal hemorrhoids. Recommendation:           - Patient has a contact number available for                            emergencies. The signs and symptoms of potential                            delayed complications were discussed with the                            patient. Return to normal activities tomorrow.                            Written discharge instructions were provided to the                            patient.                           - Resume previous diet.                           - Continue present medications.                           - Await pathology results.                           - Repeat colonoscopy for surveillance based on                            pathology results.                           - Return to GI clinic PRN.                           - Follow-up with Rheumatology and Dermatology for                            ongoing evaluation and treatment of Dermatomyositis                            and rash. Gerrit Heck, MD  12/17/2018 12:09:17 PM

## 2018-12-17 NOTE — Progress Notes (Signed)
To PACU, VSS. Report to [email or web address] 

## 2018-12-19 ENCOUNTER — Telehealth: Payer: Self-pay | Admitting: *Deleted

## 2018-12-19 ENCOUNTER — Telehealth: Payer: Self-pay

## 2018-12-19 NOTE — Telephone Encounter (Signed)
Left message with daughter on 2nd follow up call.

## 2018-12-19 NOTE — Telephone Encounter (Signed)
No answer for post procedure call back. Left message and will call pt back later today.

## 2018-12-30 ENCOUNTER — Encounter: Payer: Self-pay | Admitting: Gastroenterology

## 2019-03-01 ENCOUNTER — Ambulatory Visit: Payer: Self-pay

## 2019-09-02 DIAGNOSIS — I872 Venous insufficiency (chronic) (peripheral): Secondary | ICD-10-CM | POA: Insufficient documentation

## 2020-07-15 ENCOUNTER — Ambulatory Visit (INDEPENDENT_AMBULATORY_CARE_PROVIDER_SITE_OTHER): Payer: Self-pay | Admitting: Orthopedic Surgery

## 2020-07-15 ENCOUNTER — Encounter: Payer: Self-pay | Admitting: Orthopedic Surgery

## 2020-07-15 ENCOUNTER — Other Ambulatory Visit: Payer: Self-pay

## 2020-07-15 ENCOUNTER — Ambulatory Visit: Payer: Self-pay

## 2020-07-15 VITALS — BP 115/77 | HR 80 | Ht 67.0 in | Wt 155.0 lb

## 2020-07-15 DIAGNOSIS — M25532 Pain in left wrist: Secondary | ICD-10-CM

## 2020-07-15 DIAGNOSIS — S62025A Nondisplaced fracture of middle third of navicular [scaphoid] bone of left wrist, initial encounter for closed fracture: Secondary | ICD-10-CM

## 2020-07-15 NOTE — Patient Instructions (Signed)
Rehabilitacin del esguince de mueca Wrist Sprain Rehab Pregunte al mdico qu ejercicios son seguros para usted. Haga los ejercicios exactamente como se lo haya indicado el mdico y gradelos como se lo hayan indicado. Es normal sentir un estiramiento leve, tironeo, opresin o Tree surgeon al [employer] [place]. Detngase de inmediato si siente un dolor repentino o Printmaker. No comience a hacer estos ejercicios hasta que se lo indique el mdico. Ejercicios de elongacin y amplitud de movimiento Estos ejercicios calientan los msculos y las articulaciones, y mejoran la movilidad y la flexibilidad de la [place]. Estos ejercicios tambin Geophysicist/field seismologist, el adormecimiento y el hormigueo. Amplitud de movimientos con flexin de la mueca Flexione el codo izquierdo/derecho en un ngulo de 90 grados (ngulo recto) con la palma mirando al suelo. Doble la [employer] hacia adelante de modo que los dedos apunten hacia el suelo (flexin). Mantenga esta posicin durante ___________ segundos. Vuelva lentamente a la posicin inicial. Repita __________ veces. Realice este ejercicio __________ veces al da. Amplitud de movimientos con extensin de la mueca Flexione el codo izquierdo/derecho en un ngulo de 90 grados (ngulo recto) con la palma mirando al suelo. Flexione la [employer] atrs de modo que los dedos apunten hacia el techo (extensin). Mantenga esta posicin durante ___________ segundos. Vuelva lentamente a la posicin inicial. Repita __________ veces. Realice este ejercicio __________ veces al da. Amplitud de movimientos con desviacin cubital Flexione el codo izquierdo/derecho en un ngulo de 90 grados (ngulo recto) y apoye el antebrazo sobre una mesa, con la palma [place]. Con la mano plana sobre la mesa, doble la mueca izquierda/derecha hacia el dedo pequeo (meique). Esto es la desviacin cubital. Mantenga esta posicin durante ___________ segundos. Lentamente, lleve la  palma a la posicin inicial. Repita __________ veces. Realice este ejercicio __________ veces al da. Amplitud de movimientos con desviacin radial Flexione el codo izquierdo/derecho en un ngulo de 90 grados (ngulo recto) y apoye el antebrazo sobre una mesa, con la palma [place]. Con la mano plana sobre la mesa, doble la mueca izquierda/derecha Armed forces training and education officer. Esto es la desviacin radial. Mantenga esta posicin durante ___________ segundos. Lentamente, lleve la palma a la posicin inicial. Repita __________ veces. Realice este ejercicio __________ veces al da. Movimiento de Emergency planning/management officer de dardos Este ejercicio [employer] cuatro movimientos de la [place] --flexin de la [place], extensin de la [place], desviacin cubital y desviacin radial-- en un ejercicio. [place] es similar a cuando se lanza un dardo. Sintese y apoye el codo izquierdo/derecho en una mesa. Comience con la mueca extendida. Mantenga los dedos relajados durante el ejercicio. En el mismo movimiento, flexione la [employer] atrs (extensin) y Armed forces training and education officer (desviacin radial). Muvase lentamente y llegue hasta donde pueda. Luego, en el mismo movimiento, flexione la [employer] adelante (flexin) y hacia el dedo pequeo (meique) (desviacin cubital). Muvase lentamente y llegue hasta donde pueda. Vuelva lentamente a la posicin inicial. Repita __________ veces. Realice este ejercicio __________ veces al da. Elongacin con flexin de la mueca  Extienda el brazo izquierdo/derecho hacia adelante y gire la palma hacia abajo, en direccin al suelo. Si el mdico se lo ha indicado, flexione el codo izquierdo/derecho en un ngulo de 90 grados (ngulo recto) al costado del cuerpo. Con la mano que no est lesionada, haga presin suavemente sobre el dorso de la mano izquierda/derecha para doblar la [place] y los dedos hacia el suelo (flexin). Llegue hasta donde sienta un estiramiento sin Education officer, environmental. Mantenga esta  posicin durante  __________ segundos. Vuelva lentamente a la posicin inicial. Repita __________ veces. Realice este ejercicio __________ veces al da. Elongacin con extensin de la mueca  Extienda el brazo izquierdo/derecho hacia adelante y gire la palma [place], en direccin al techo. Si el mdico se lo ha indicado, flexione el codo izquierdo/derecho en un ngulo de 90 grados (ngulo recto) al costado del cuerpo. Con la mano que no est lesionada, haga presin suavemente sobre la palma de la mano izquierda/derecha para doblar la [place] y los dedos hacia el suelo (extensin). Llegue hasta donde sienta un estiramiento sin Education officer, environmental. Mantenga esta posicin durante __________ segundos. Vuelva lentamente a la posicin inicial. Repita __________ veces. Realice este ejercicio __________ veces al da. Elongacin con desviacin cubital Flexione el codo izquierdo/derecho en un ngulo de 90 grados (ngulo recto). Apoye el antebrazo, la [place] y la mano sobre una mesa con la palma [place], en direccin al suelo. Coloque la mano no lesionada sobre el dorso de la mano izquierda/derecha para Product/process development scientist que se mueva durante el ejercicio. Con cuidado, aleje el codo izquierdo/derecho del cuerpo. Esto har girar la [employer] el dedo pequeo (meique). Esto es la desviacin cubital. Mantenga esta posicin durante __________ segundos. Vuelva lentamente a la posicin inicial. Repita __________ veces. Realice este ejercicio __________ veces al da. Elongacin con desviacin radial Flexione el codo izquierdo/derecho en un ngulo de 90 grados (ngulo recto). Apoye el antebrazo, la [place] y la mano sobre una mesa con la palma [place], en direccin al suelo. Coloque la mano no lesionada sobre el dorso de la mano izquierda/derecha para Product/process development scientist que se mueva durante el ejercicio. Con cuidado, mueva el codo izquierdo/derecho [employer] cuerpo. Esto har girar la Geophysical data processor (desviacin  radial). Mantenga esta posicin durante __________ segundos. Vuelva lentamente a la posicin inicial. Repita __________ veces. Realice este ejercicio __________ veces al da. Ejercicios de fortalecimiento Estos ejercicios fortalecen la [employer] y le otorgan resistencia. La resistencia es la capacidad de usar los msculos durante un tiempo prolongado, incluso despus de que se cansen. Los 4 primeros ejercicios son isomtricos, lo que significa que crean tensin en un msculo o grupo de msculos sin Heritage manager. Flexin isomtrica de la mueca Flexione el codo izquierdo/derecho en un ngulo de 90 grados (ngulo recto). Apoye el antebrazo, la [place] y la mano sobre una mesa con la palma [place], en direccin al suelo. Sin mover ninguna articulacin (isomtrico), tense los msculos como si tratara de Quarry manager (flexionar) la [place]. Comience con un esfuerzo ligero y aumente su esfuerzo segn lo tolere. Mantenga esta posicin durante __________ segundos. Luego relaje los msculos. Repita __________ veces. Realice este ejercicio __________ veces al da. Extensin isomtrica de Advertising copywriter Flexione el codo izquierdo/derecho en un ngulo de 90 grados (ngulo recto). Apoye el antebrazo, la [place] y la mano sobre una mesa con la palma [place], en direccin al suelo. Coloque la mano no afectada sobre el dorso de la mano izquierda/derecha. Sin mover ninguna articulacin (isomtrico), tensione los msculos para levantar la mano izquierda/derecha de la mesa (extensin). Use la mano no afectada para Solicitor izquierda/derecha sobre la mesa. Comience con un esfuerzo ligero y aumente su esfuerzo segn lo tolere. Mantenga esta posicin durante __________ segundos. Luego relaje los msculos. Repita __________ veces. Realice este ejercicio __________ veces al da. Desviacin cubital isomtrica Flexione el codo izquierdo/derecho en un ngulo de 90 grados (ngulo recto). Apoye el antebrazo, la [place]  y la mano sobre una mesa con [place]  pulgar Dwyane Luo Erma Pinto, en direccin al techo (posicin neutral). Cierre ligeramente el puo izquierdo/derecho. Sin mover ninguna articulacin (isomtrico), tensione los msculos para presionar el costado del puo izquierdo/derecho contra la mesa (desviacin cubital). Comience con un esfuerzo ligero y aumente su esfuerzo segn lo tolere. Mantenga esta posicin durante __________ segundos. Luego relaje los msculos. Repita __________ veces. Realice este ejercicio __________ veces al da. Desviacin radial isomtrica Flexione el codo izquierdo/derecho en un ngulo de 90 grados (ngulo recto). Apoye el antebrazo, la [place] y la mano sobre una mesa con el pulgar hacia arriba, en direccin al techo (posicin neutral). Cierre ligeramente el puo izquierdo/derecho. Coloque la mano no afectada sobre la parte superior del puo. Sin mover ninguna articulacin (isomtrico), tensione los msculos para levantar el puo izquierdo/derecho de Conservation officer, nature (desviacin radial). Use la mano no afectada para Solicitor izquierda/derecha sobre la mesa. Comience con un esfuerzo ligero y aumente su esfuerzo segn lo tolere. Mantenga esta posicin durante __________ segundos. Luego relaje los msculos. Repita __________ veces. Realice este ejercicio __________ veces al da. Flexin de la mueca  Sintese con el antebrazo izquierdo/derecho apoyado en una mesa u otra superficie. Flexione el codo en un ngulo de 90 grados (ngulo recto) y apoye la palma de la mano sobre el borde de la mesa. Sostenga una pesa de __________ con la mano izquierda/derecha. O bien, sujete una banda o un tubo de goma para ejercicios con ambas manos, [employer] las manos al [employer] nivel y separadas al ancho de las caderas. La banda para ejercicios debe estar levemente tensa. Doble lentamente la mano hacia el techo (flexin). Mantenga esta posicin durante __________ segundos. Baje lentamente la mano hasta la posicin  inicial. Repita __________ veces. Realice este ejercicio __________ veces al da. Extensin de la mueca  Sintese con el antebrazo izquierdo/derecho apoyado en una mesa u otra superficie. Flexione el codo en un ngulo de 90 grados (ngulo recto) y [employer] palma de la mano, boca [place], sobre el borde de la mesa. Sostenga una pesa de __________ con la mano izquierda/derecha. O bien, sujete una banda o un tubo de goma para ejercicios con ambas manos, [employer] las manos al [employer] nivel y separadas al ancho de las caderas. La banda para ejercicios debe estar levemente tensa. Lentamente doble la mueca hacia el techo (extensin). Mantenga esta posicin durante __________ segundos. Baje lentamente la mano hasta la posicin inicial. Repita __________ veces. Realice este ejercicio __________ veces al da. Esta informacin no tiene Marine scientist el consejo del mdico. Asegresede hacerle al mdico cualquier pregunta que tenga. Document Revised: 06/26/2019 Document Reviewed: 06/26/2019 Elsevier Patient Education  [place].

## 2020-07-15 NOTE — Progress Notes (Signed)
New Patient Visit  Assessment: Lisa Mcdowell is a 70 y.o. female with the following: Left wrist pain  Plan: I had an extensive discussion with Mrs. Dellarocco, with the assistance of her family member who served as Astronomer.  New radiographs obtained in clinic today are not definitive for fracture in the scaphoid or anywhere else.  On physical exam, she has no swelling about the wrist.  There is some redness in the distal forearm.  She does have tenderness along the radial aspect of the forearm, into her right thumb.  It is difficult to discern the exact area of the pain.  She may have developed some CRPS due to wearing a brace at all times following a fall.  She may also have some de Quervain's tenosynovitis, although the pain is not localized to this area.  She is also developed some stiffness around her wrist, as well as decreased dexterity to her fingers.  We discussed potential treatment options including medications, Voltaren gel and hand therapy.  She declined hand therapy at this time.  I did provide her with some wrist exercises, and demonstrated some simple hand and finger exercises in the clinic.  I have also advised her to use the hand more.  She should try and wean off the use of the splint.  If she continues to have issues, we can try formal hand therapy versus a referral to hand specialist.  This was discussed with the patient and her family member, and she is amenable to this plan.   Follow-up: Return in about 4 weeks (around 08/12/2020).  Subjective:  Chief Complaint  Patient presents with   Wrist Injury    Left/? Scaphoid fracture 2 months ago     History of Present Illness: Lisa Mcdowell is a 70 y.o. female who presents for evaluation of left wrist pain.  She fell, and caught her self with her left hand approximately 2 months ago.  She did not have pain initially.  However, she developed pain in the wrist, with limited ability to use the hand.  She notes extreme pain  when trying to use her left hand for activities of daily living.  She has been wearing a brace, which is wrapped tightly around her wrist.  This allows her to sleep at night.  She has been taking some Tylenol for pain.  She presented to a chiropractor, who took x-rays and was concerned about a scaphoid fracture.  Other than the splint, she has not had any immobilization.  No issues to the left hand previously   Review of Systems: No fevers or chills No numbness or tingling No chest pain No shortness of breath No bowel or bladder dysfunction No GI distress No headaches   Medical History:  Past Medical History:  Diagnosis Date   Acute blood loss anemia    Cholelithiasis    Dermatomycosis    Gallstones 08/08/2010   GERD (gastroesophageal reflux disease)    pt denies having GERD on day of surgery   Hyperlipidemia    Nausea & vomiting    Rhabdomyolysis    Transaminitis    Varicose vein of leg     Past Surgical History:  Procedure Laterality Date   BIOPSY  08/30/2018   Procedure: BIOPSY;  Surgeon: Mauri Pole, MD;  Location: [facility];  Service: Endoscopy;;   CHOLECYSTECTOMY  08/08/2010   Procedure: LAPAROSCOPIC CHOLECYSTECTOMY;  Surgeon: Scherry Ran;  Location: [facility];  Service: General;  Laterality: N/A;   COLONOSCOPY  N/A 04/15/2015   Procedure: COLONOSCOPY;  Surgeon: Danie Binder, MD;  [facility];  Service: Endoscopy;  Laterality: N/A;  1015 - moved to 10:00 - office to notify - interpreter scheduled, do NOT change time   ESOPHAGOGASTRODUODENOSCOPY (EGD) WITH PROPOFOL N/A 08/30/2018   Procedure: ESOPHAGOGASTRODUODENOSCOPY (EGD) WITH PROPOFOL;  Surgeon: Mauri Pole, MD;  Location: [facility];  Service: Endoscopy;  Laterality: N/A;   GASTROSTOMY W/ FEEDING TUBE     HERNIA REPAIR     INSERTION OF MESH N/A 01/23/2018   Procedure: INSERTION OF MESH;  Surgeon: Donnie Mesa, MD;  Location: [facility];  Service: General;   Laterality: N/A;   MUSCLE BIOPSY Right 09/04/2018   Procedure: RIGHT THIGH MUSCLE BIOPSY;  Surgeon: Erroll Luna, MD;  Location: [facility];  Service: General;  Laterality: Right;   UMBILICAL HERNIA REPAIR N/A 01/23/2018   Procedure: UMBILICAL HERNIA REPAIR WITH MESH;  Surgeon: Donnie Mesa, MD;  Location: [facility];  Service: General;  Laterality: N/A;   [place]    Family History  Problem Relation Age of Onset   Colon cancer Neg Hx    Esophageal cancer Neg Hx    Rectal cancer Neg Hx    Stomach cancer Neg Hx    Social History   Tobacco Use   Smoking status: Never   Smokeless tobacco: Never  Vaping Use   Vaping Use: Never used  Substance Use Topics   Alcohol use: No   Drug use: No    No Active Allergies  No outpatient medications have been marked as taking for the 07/15/20 encounter (Office Visit) with Mordecai Rasmussen, MD.    Objective: BP 115/77   Pulse 80   Ht 5\' 7"  (1.702 m)   Wt 155 lb (70.3 kg)   BMI 24.28 kg/m   Physical Exam:  General: Elderly female., Alert and oriented., and No acute distress. Gait: Normal gait.  Motion of the left wrist demonstrates no swelling.  No deformity is appreciated.  There is some redness in the distal forearm.  Atrophy of the forearm in general.  He does have some twitching of the fingers, including the thumb.  Fingers are warm and well perfused.  Active motion intact in the AIN/PIN/U nerve distributions.  Sensation is intact throughout the hand.  2+ radial pulse.  Some tenderness to palpation over the first dorsal compartment.  Difficult to assess as it is painful for her to get in the [place] position.  Exquisite tenderness to palpation at the radial styloid.  Minimal pain within the snuffbox.  He is able to make a full fist.  Strength is limited.  He tolerates 20 degrees of extension, 45 degrees of flexion of the wrist.  No pain with [facility] grind.   IMAGING: I personally ordered and  reviewed the following images  X-rays of the left wrist and scaphoid were obtained in clinic today.  No acute injuries are noted.  No evidence of a previous, healed fracture of the scaphoid.  No callus formation is appreciated.  Mild degenerative changes at the base of the first [facility] joint.  Joint spaces are maintained throughout wrist and hand.  Impression: Left wrist x-rays without acute injury; mild degenerative changes to the first [facility] joint   New Medications:  No orders of the defined types were placed in this encounter.     Mordecai Rasmussen, MD  07/15/2020 12:58 PM  -

## 2020-10-29 IMAGING — US ULTRASOUND ABDOMEN LIMITED
1 series · 14 of 25 positions shown · non-contrast
Comparison: 12/16/2014 abdominal CT

CLINICAL DATA: Transaminitis

EXAM:
ULTRASOUND ABDOMEN LIMITED RIGHT UPPER QUADRANT

[Series 1: ultrasound abdomen limited · 14 of 26 slices shown]
[im 1/26]
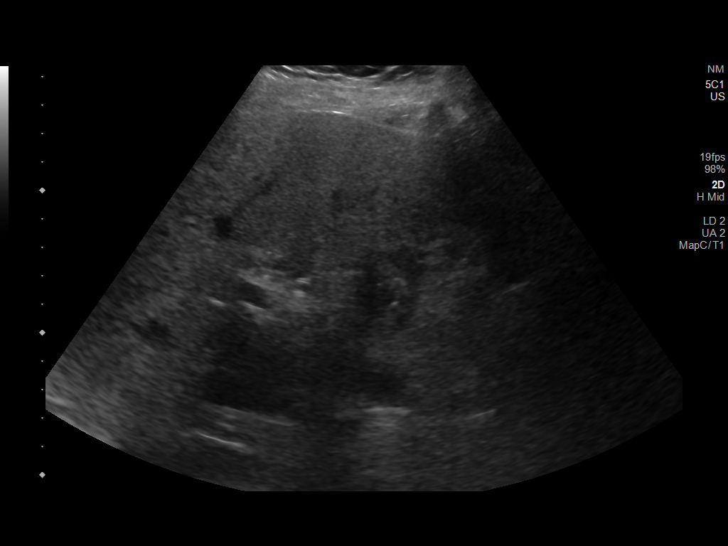
[im 3/26]
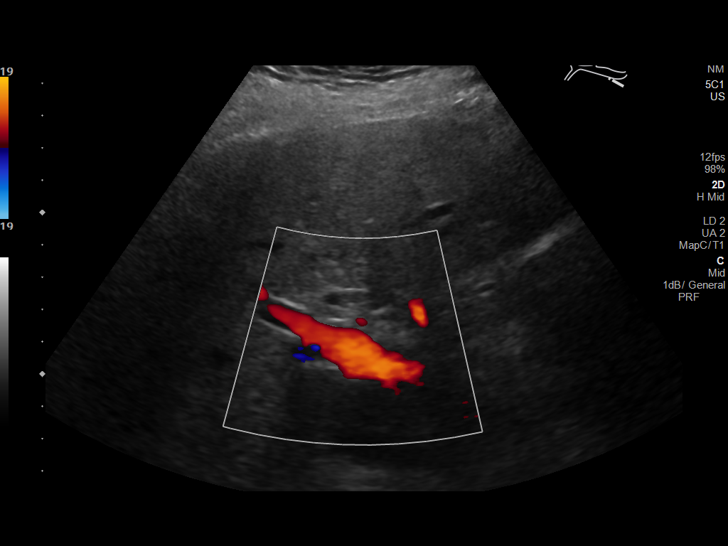
[im 5/26]
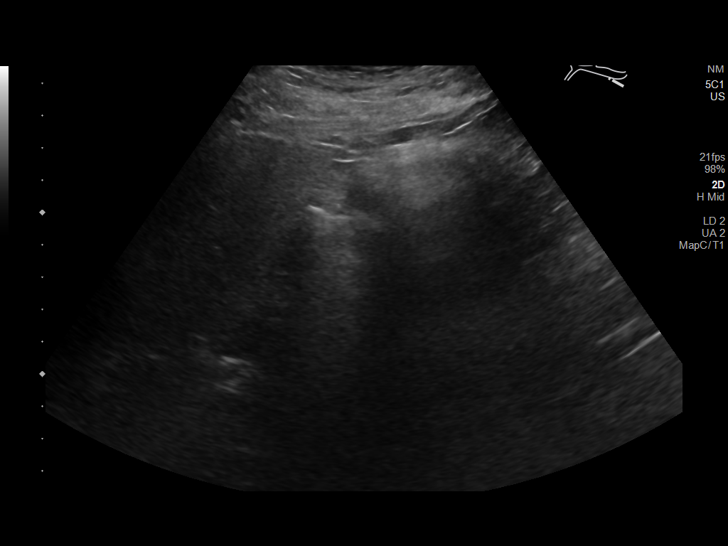
[im 7/26]
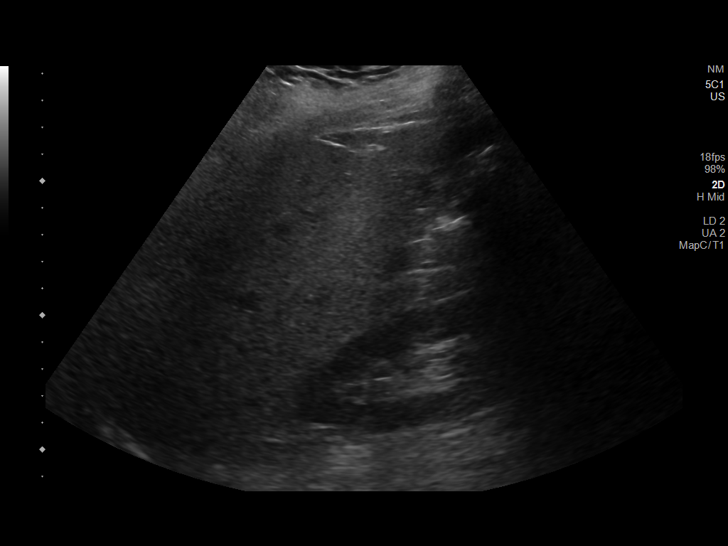
[im 9/26]
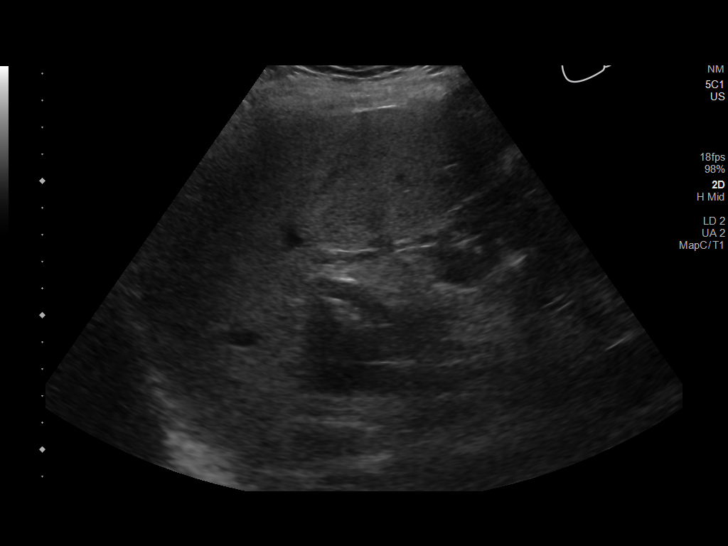
[im 10/26]
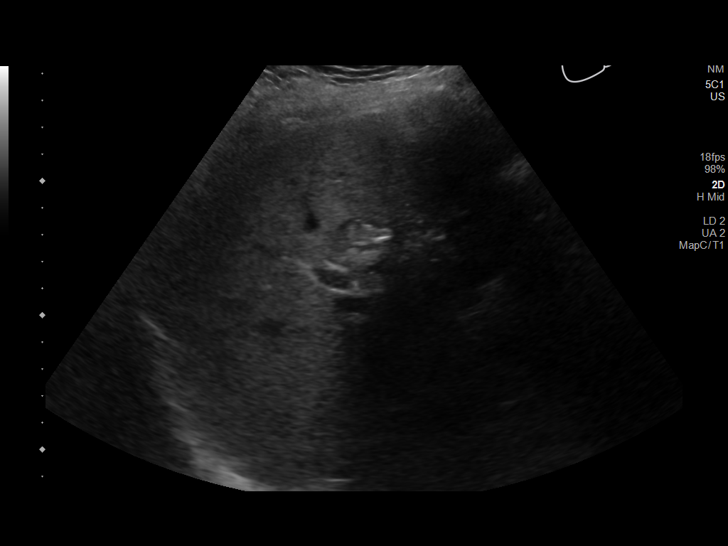
[im 12/26]
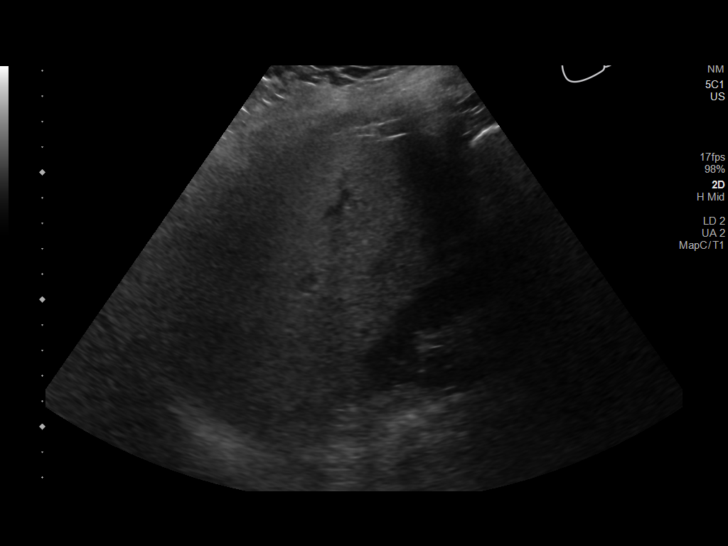
[im 14/26]
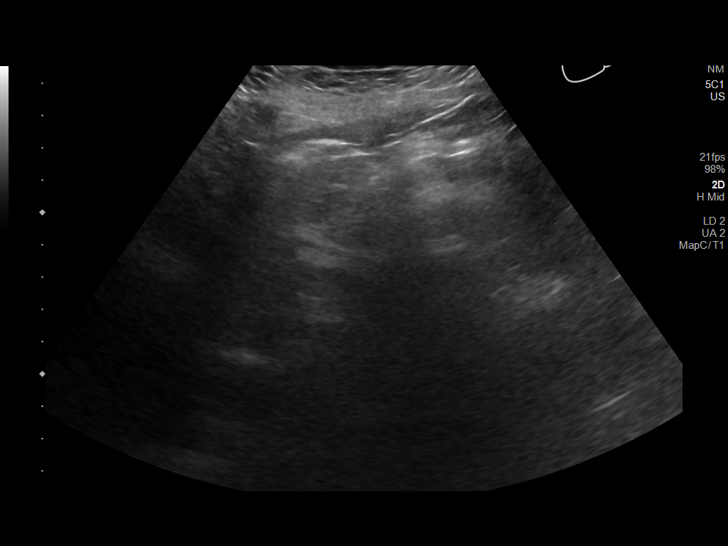
[im 16/26]
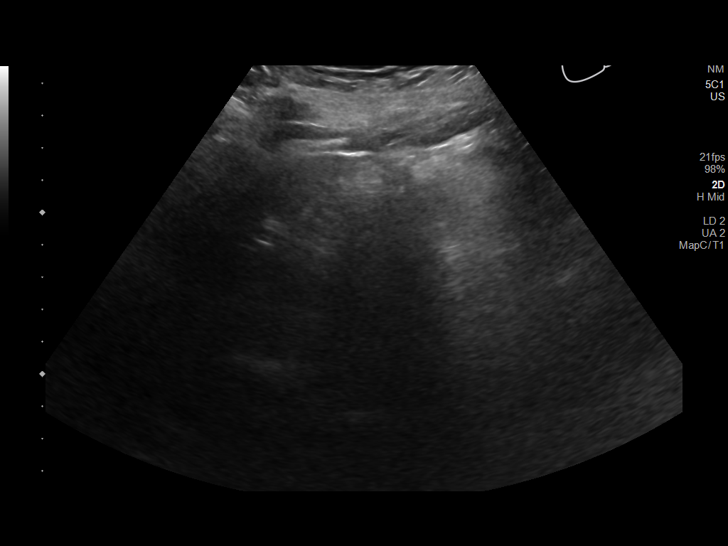
[im 17/26]
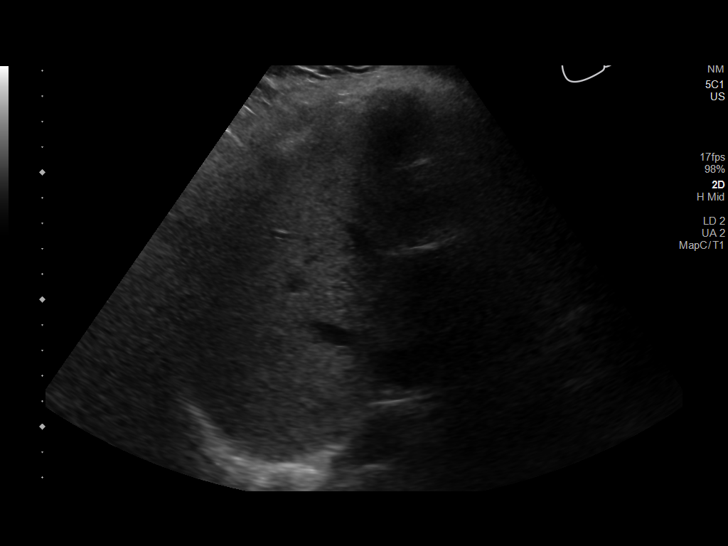
[im 19/26]
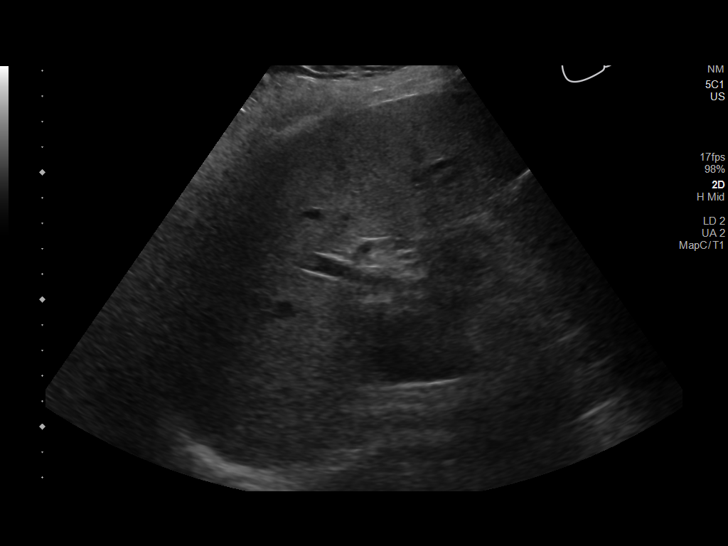
[im 21/26]
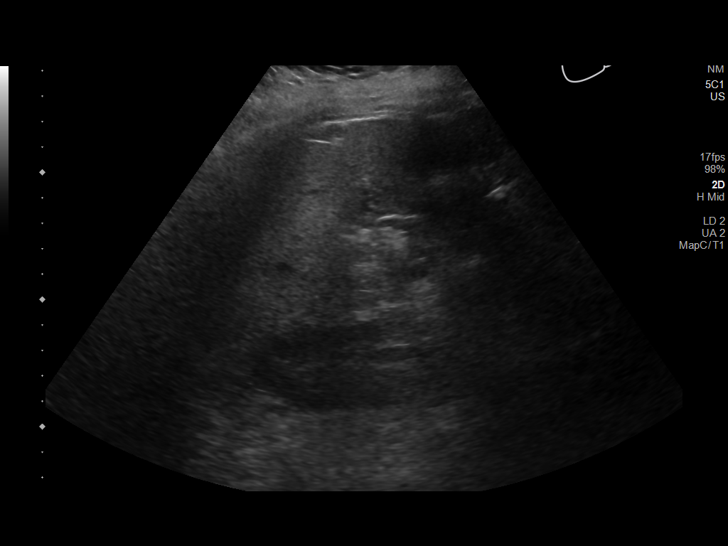
[im 23/26]
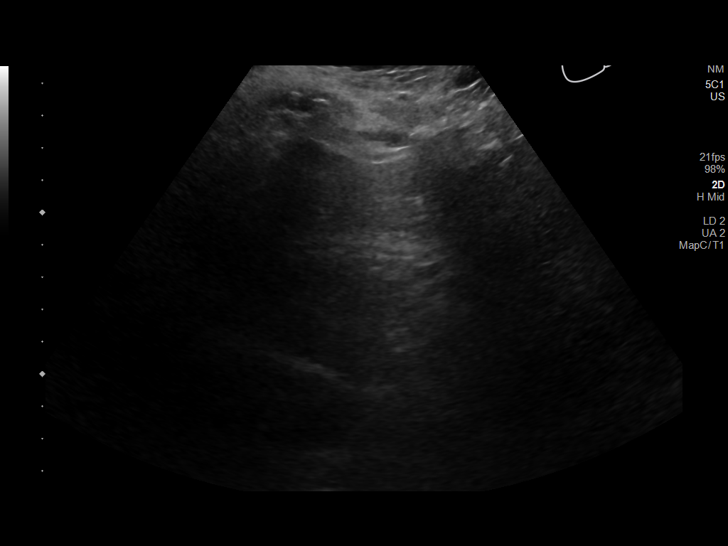
[im 26/26]
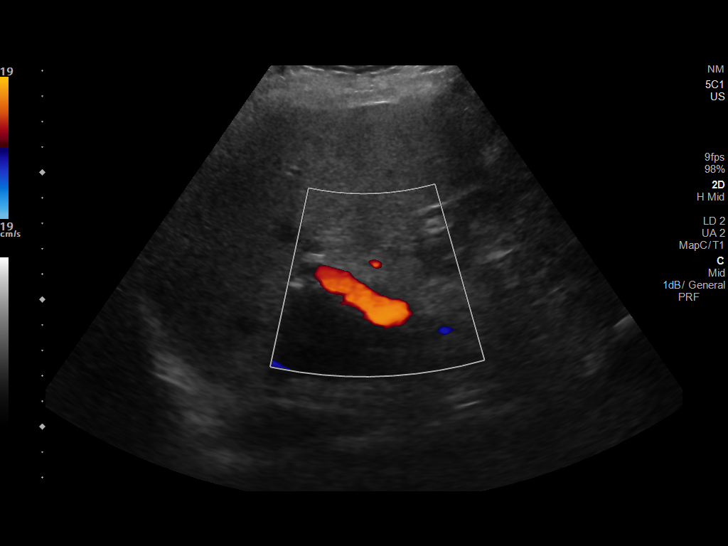

[14 of 25 positions shown; findings below may reference images not displayed]

FINDINGS: Gallbladder:

Cholecystectomy.

Common bile duct:

Diameter: 4 mm at the hilum. The distal common bile duct is
obscured.

Liver:

Echogenic liver with diminished acoustic penetration. Portal vein is
patent on color Doppler imaging with normal direction of blood flow
towards the liver.
IMPRESSION: 1. Hepatic steatosis.
2. Cholecystectomy with normal common bile duct diameter at the
hilum.

## 2020-11-01 IMAGING — RF ABDOMEN - 1 VIEW
1 series · 1 of 1 positions shown · non-contrast
Comparison: None.

CLINICAL DATA: Feeding catheter placement

EXAM:
ABDOMEN - 1 VIEW

[Series 1: cp_standard · 0.25mm/px · 1 of 1 slices shown]
[im 1/1]
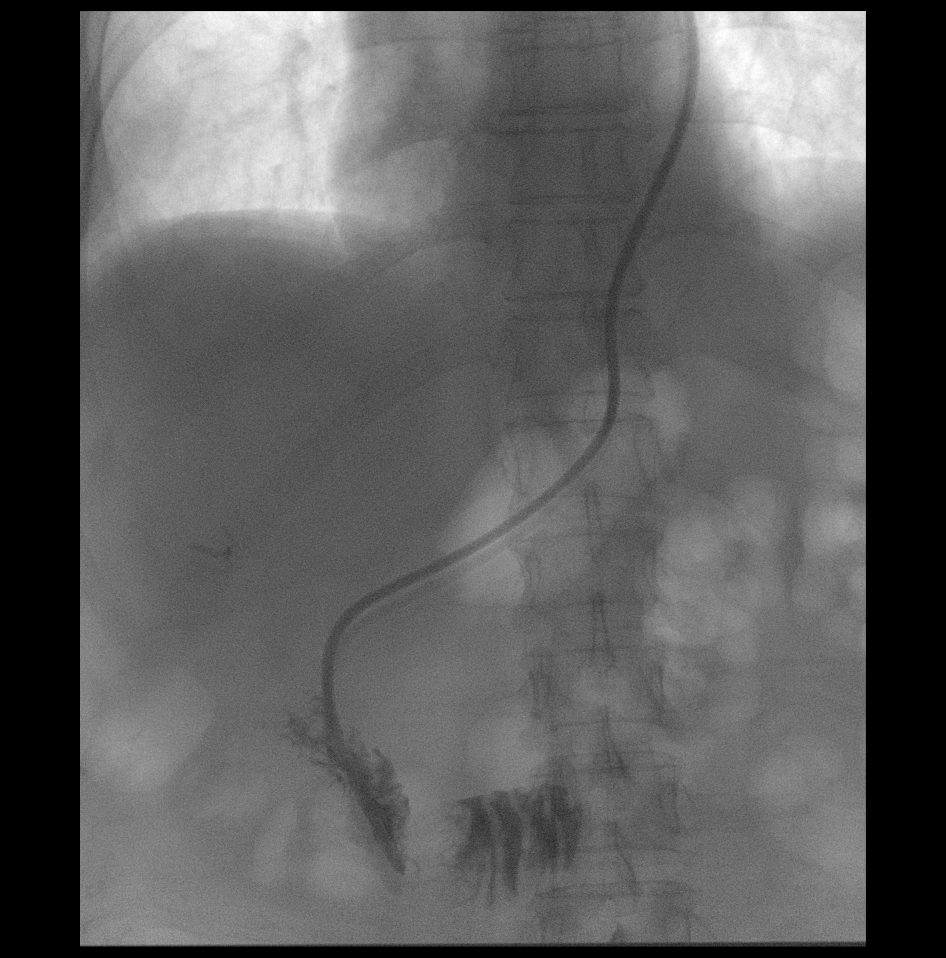

[1 of 1 positions shown; findings below may reference images not displayed]

FINDINGS: Feeding catheter is noted in the second portion the duodenum. This
was confirmed utilizing a contrast injection.
IMPRESSION: Feeding catheter in the second portion of the duodenum.

## 2020-11-02 IMAGING — DX ABDOMEN - 1 VIEW
1 series · 1 of 1 positions shown · non-contrast
Comparison: None.

CLINICAL DATA: NG tube placement.

EXAM:
ABDOMEN - 1 VIEW

[abdomen kub]
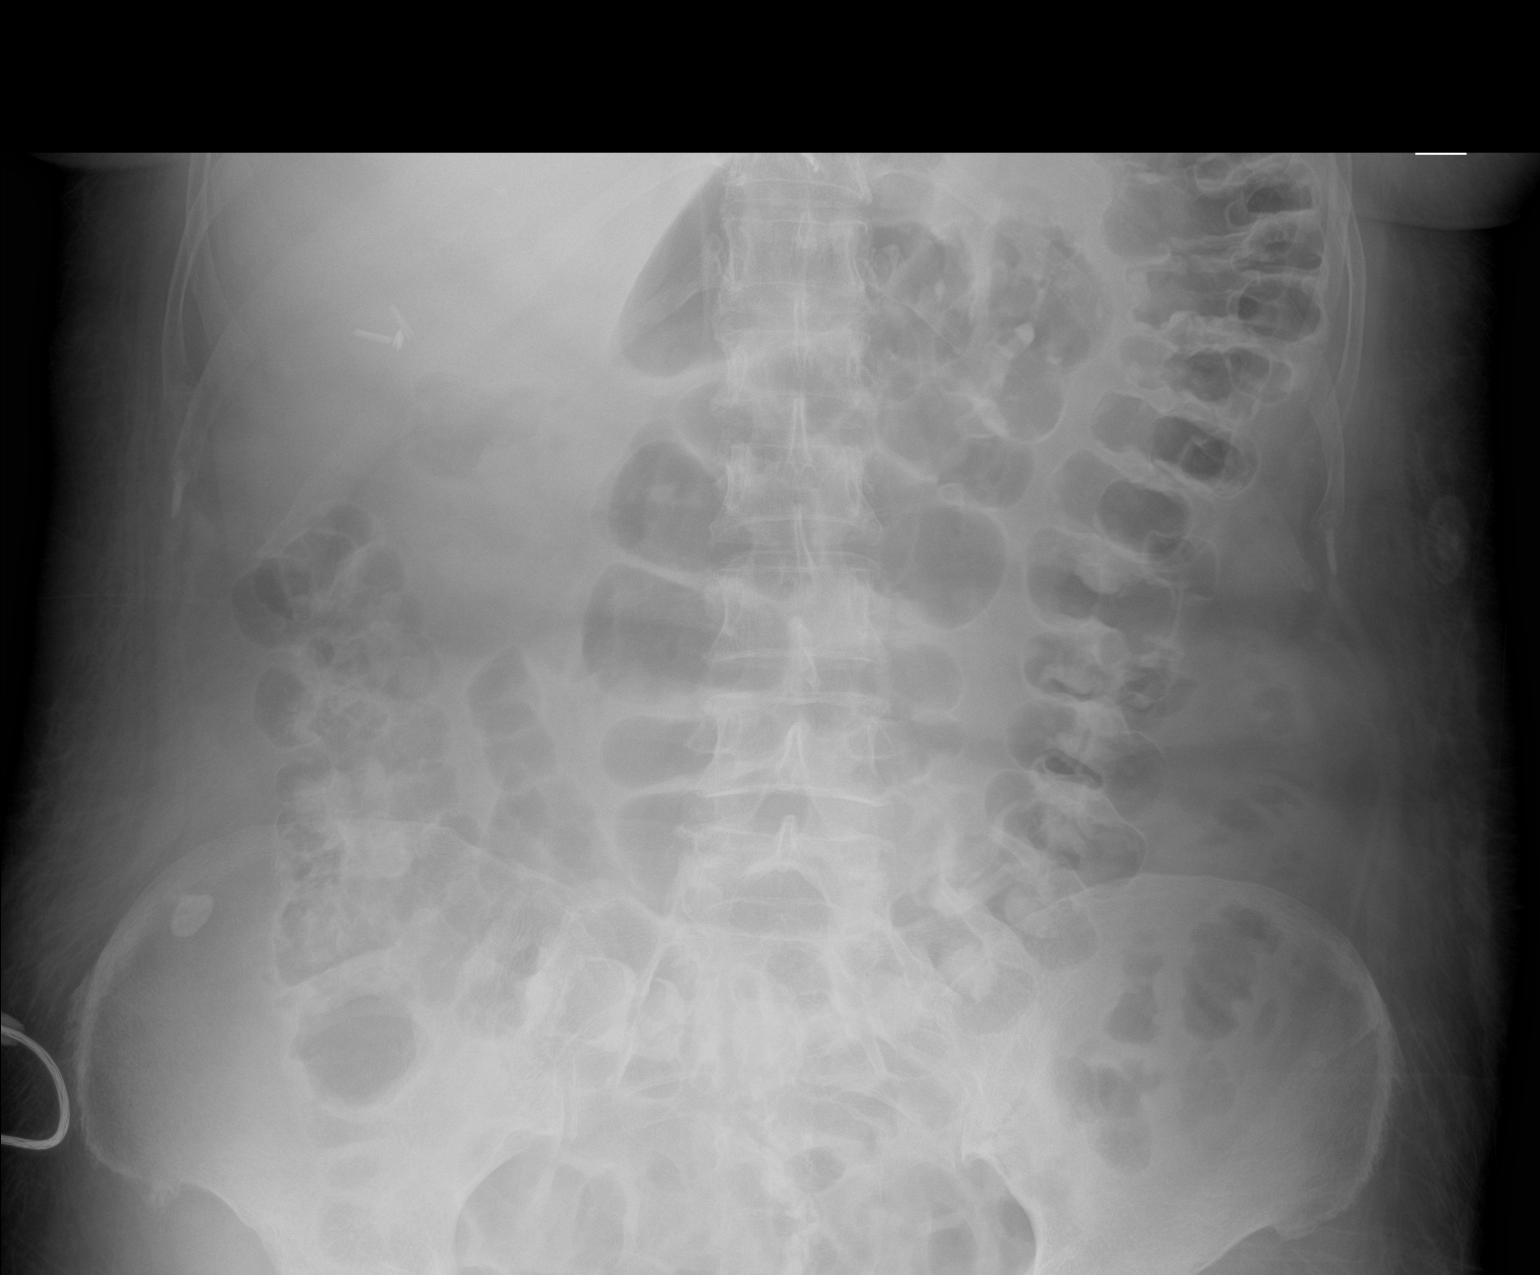

[1 of 1 positions shown; findings below may reference images not displayed]

FINDINGS: NG tube is present within the upper abdomen, likely at the upper
margin of the stomach fundus. Visualized bowel gas pattern is
nonobstructive. No evidence of free intraperitoneal air seen.
Cholecystectomy clips in the RIGHT upper quadrant.
IMPRESSION: NG tube is seen at the upper aspects of this exam, with tip likely
at the upper margin of the stomach fundus and with proximal side
holes at the gastroesophageal junction or within a small hiatal
hernia (demonstrated on earlier CT). Recommend advancing 5-10 cm for
more optimal radiographic positioning.

## 2020-11-04 IMAGING — MR MRI OF THE RIGHT FEMUR WITHOUT AND WITH CONTRAST
14 series · 40 of 40 positions shown · IV contrast (Gadavist)
Comparison: None.

CLINICAL DATA: Left shoulder and right femur pain. Evaluate for
site of biopsy.

EXAM:
MRI OF THE RIGHT FEMUR WITHOUT AND WITH CONTRAST
TECHNIQUE: Multiplanar, multisequence MR imaging of the right femur was
performed both before and after administration of intravenous
contrast.
CONTRAST:  7 mL Gadavist

[Series 5: composed cor t1_comp_filt · coronal · right · 6.0mm · 1.04mm/px · 3 of 28 slices shown]
[im 1/28]
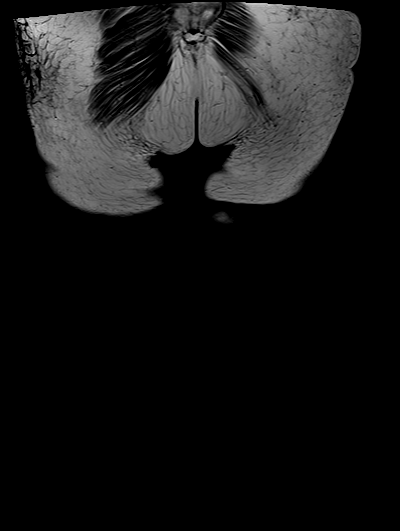
[im 14/28]
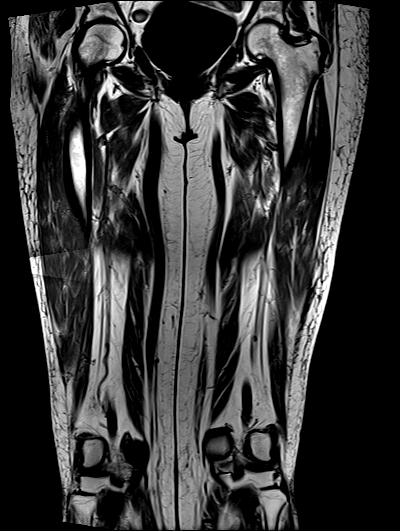
[im 28/28]
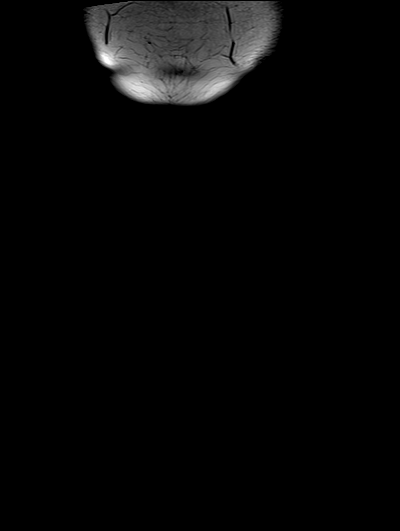

[Series 9: composed cor stir_comp_filt · coronal · right · 6.0mm · 1.04mm/px · 3 of 28 slices shown]
[im 1/28]
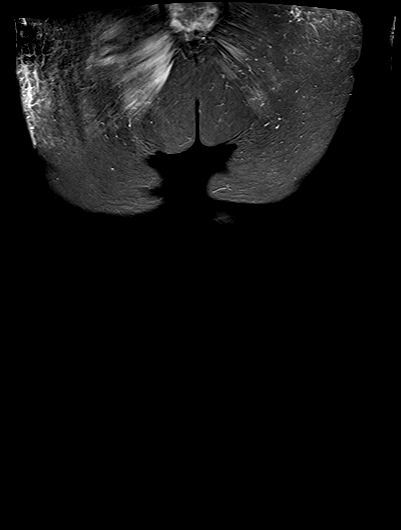
[im 14/28]
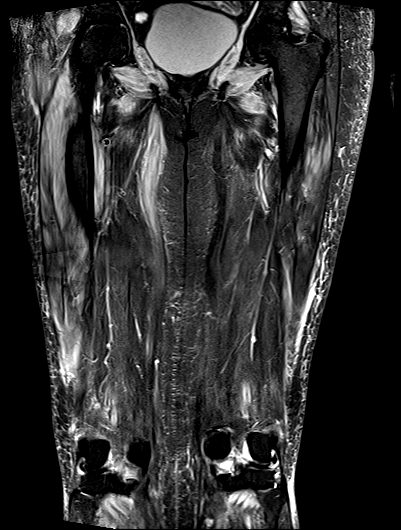
[im 28/28]
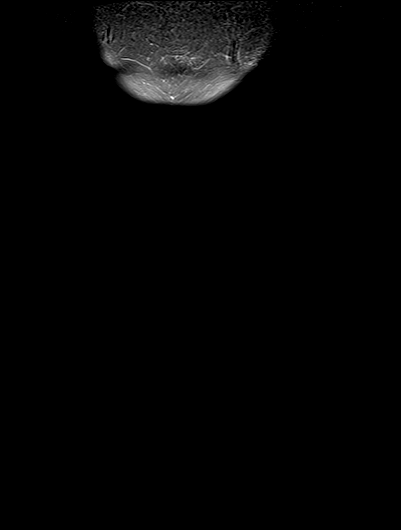

[Series 15: T2 · axial · right · 5.0mm · 0.96mm/px · z∈[-139,+245]mm · 5 of 65 slices shown]
[im 1/65]
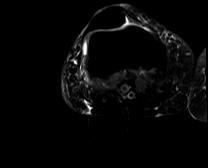
[im 17/65]
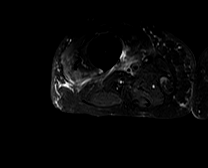
[im 33/65]
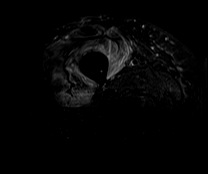
[im 49/65]
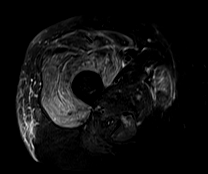
[im 65/65]
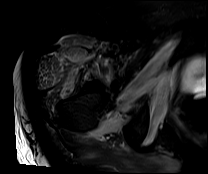

[Series 16: T2 fat-sat · sagittal · right · 4.0mm · 1.04mm/px · 3 of 32 slices shown (1 of 2)]
[im 1/32]
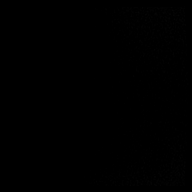
[im 16/32]
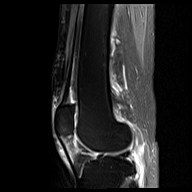
[im 32/32]
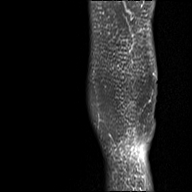

[Series 17: T2 fat-sat · sagittal · right · 4.0mm · 1.56mm/px · 2 of 35 slices shown (2 of 2)]
[im 1/35]
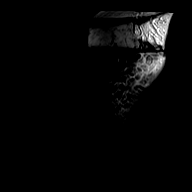
[im 35/35]
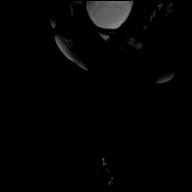

[Series 21: T1 fat-sat · axial · right · 5.0mm · 0.78mm/px · z∈[-139,+245]mm · 4 of 65 slices shown (1 of 3)]
[im 1/65]
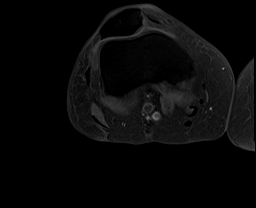
[im 22/65]
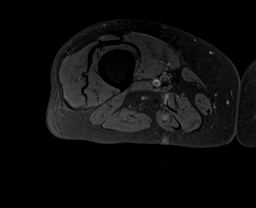
[im 43/65]
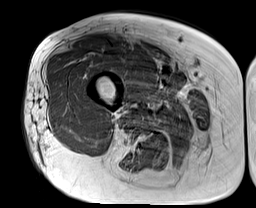
[im 65/65]
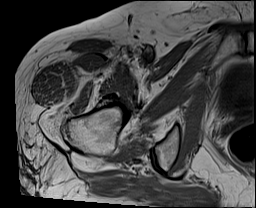

[Series 22: T1 · axial · right · 5.0mm · 0.78mm/px · z∈[+69,+273]mm · 2 of 35 slices shown]
[im 1/35]
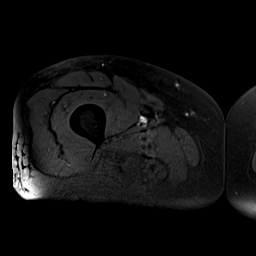
[im 35/35]
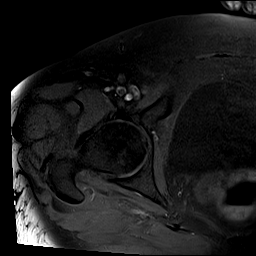

[Series 24: ax t1_comp · axial · right · 5.0mm · 0.78mm/px · z∈[-111,+273]mm · 4 of 65 slices shown]
[im 1/65]
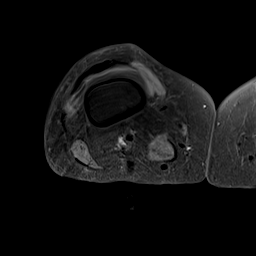
[im 22/65]
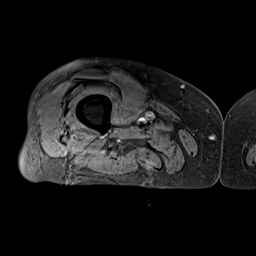
[im 43/65]
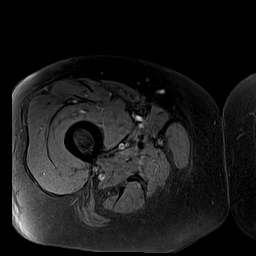
[im 65/65]
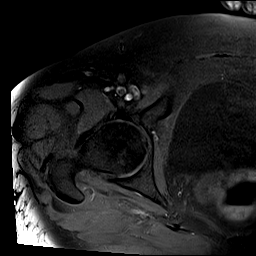

[Series 27: T1 fat-sat · axial · right · 5.0mm · 0.78mm/px · z∈[-139,+245]mm · 4 of 65 slices shown (2 of 3)]
[im 1/65]
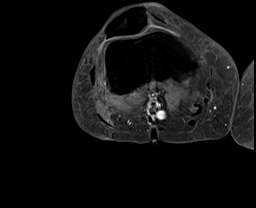
[im 22/65]
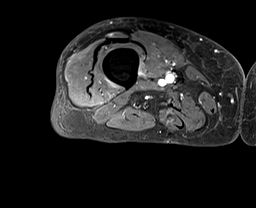
[im 43/65]
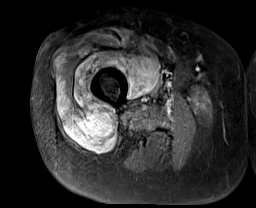
[im 65/65]
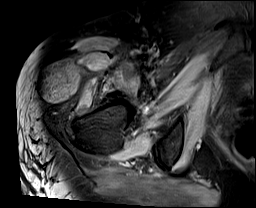

[Series 28: T1 fat-sat post-contrast · sagittal · right · 4.0mm · 1.17mm/px · 2 of 35 slices shown (1 of 2)]
[im 1/35]
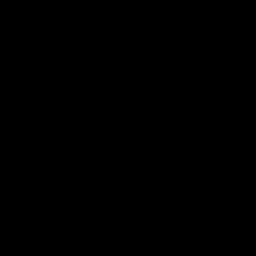
[im 35/35]
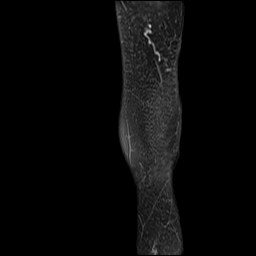

[Series 29: T1 fat-sat post-contrast · sagittal · right · 4.0mm · 1.17mm/px · 2 of 35 slices shown (2 of 2)]
[im 1/35]
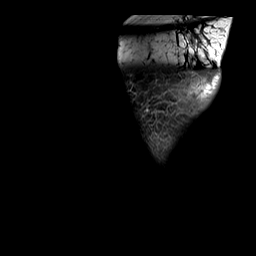
[im 35/35]
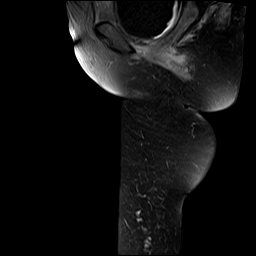

[Series 30: T1 fat-sat · sagittal · right · 5.0mm · 1.17mm/px · 2 of 35 slices shown (3 of 3)]
[im 1/35]
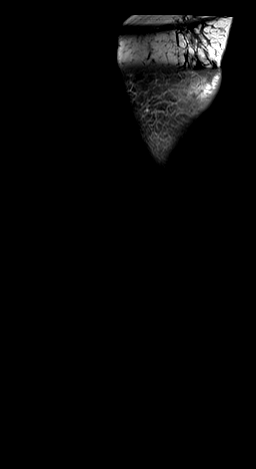
[im 35/35]
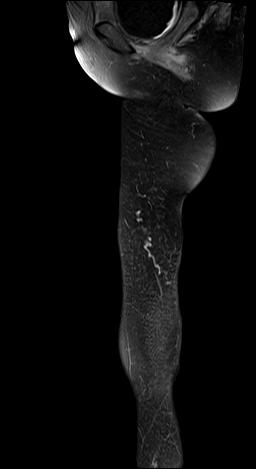

[Series 31: T1 post-contrast · coronal · right · 5.0mm · 1.04mm/px · 2 of 28 slices shown (1 of 2)]
[im 1/28]
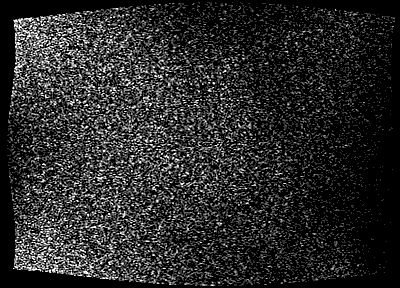
[im 28/28]
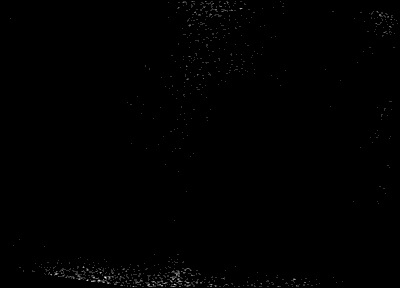

[Series 35: T1 post-contrast · coronal · right · 5.0mm · 1.04mm/px · 2 of 28 slices shown (2 of 2)]
[im 1/28]
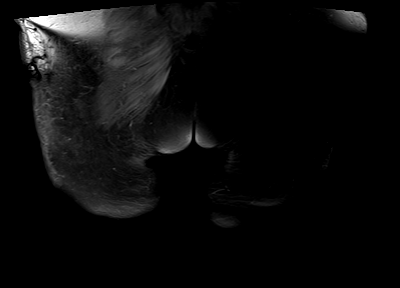
[im 28/28]
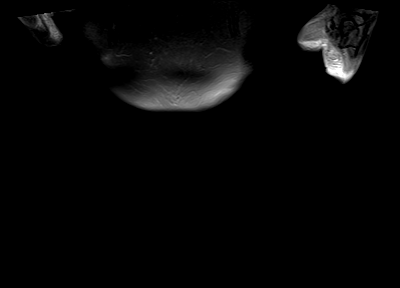

[40 of 40 positions shown; findings below may reference images not displayed]

FINDINGS: Bones/Joint/Cartilage

No marrow signal abnormality. No fracture or dislocation. Normal
alignment. No joint effusion. No aggressive osseous lesion. No
periosteal reaction or bone destruction.

Mild tricompartmental osteoarthritis of bilateral knees. Mild
osteoarthritis of bilateral hips.

Ligaments

Collateral ligaments are intact.

Muscles and Tendons
Muscle edema and enhancement throughout the bilateral rectus
femoris, vastus lateralis, vastus medialis and vastus intermedius
muscles. Muscle edema is most severe in the right vastus lateralis
muscle.

Muscle edema and enhancement of the bilateral tensor fascia lata
muscle. Muscle edema in the proximal half of the bilateral gracilis
muscle. Muscle edema and enhancement of the bilateral obturator
internus, obturator externus and adductor magnus.

Mild muscle edema in the proximal most aspect of the semitendinosis
muscle bilaterally.

Quadriceps tendon and patellar tendon are intact.

Edema in right Hoffa's fat.

Soft tissue
No fluid collection or hematoma.  No soft tissue mass.
IMPRESSION: Myositis of bilateral thighs most severe in the quadriceps
musculature. Overall appearance is most severe focally in the
proximal right vastus lateralis muscle. This appearance can be seen
with an inflammatory myositis such as polymyositis or
dermatomyositis or myositis related to connective tissue disease.

## 2021-03-16 ENCOUNTER — Encounter: Payer: Self-pay | Admitting: *Deleted

## 2021-03-16 ENCOUNTER — Ambulatory Visit (INDEPENDENT_AMBULATORY_CARE_PROVIDER_SITE_OTHER): Payer: Self-pay | Admitting: Internal Medicine

## 2021-03-16 ENCOUNTER — Encounter: Payer: Self-pay | Admitting: Internal Medicine

## 2021-03-16 ENCOUNTER — Other Ambulatory Visit: Payer: Self-pay

## 2021-03-16 VITALS — BP 126/80 | HR 67 | Ht 69.6 in | Wt 154.2 lb

## 2021-03-16 DIAGNOSIS — R002 Palpitations: Secondary | ICD-10-CM

## 2021-03-16 NOTE — Patient Instructions (Signed)
Medication Instructions:  Your physician recommends that you continue on your current medications as directed. Please refer to the Current Medication list given to you today.  *If you need a refill on your cardiac medications before your next appointment, please call your pharmacy*   Lab Work: NONE   If you have labs (blood work) drawn today and your tests are completely normal, you will receive your results only by: MyChart Message (if you have MyChart) OR A paper copy in the mail If you have any lab test that is abnormal or we need to change your treatment, we will call you to review the results.   Testing/Procedures: NONE    Follow-Up: At [facility], you and your health needs are our priority.  As part of our continuing mission to provide you with exceptional heart care, we have created designated Provider Care Teams.  These Care Teams include your primary Cardiologist (physician) and Advanced Practice Providers (APPs -  Physician Assistants and Nurse Practitioners) who all work together to provide you with the care you need, when you need it.  We recommend signing up for the patient portal called "MyChart".  Sign up information is provided on this After Visit Summary.  MyChart is used to connect with patients for Virtual Visits (Telemedicine).  Patients are able to view lab/test results, encounter notes, upcoming appointments, etc.  Non-urgent messages can be sent to your provider as well.   To learn more about what you can do with MyChart, go to [email or web address].    Your next appointment:    To Be Determined   The format for your next appointment:   In Person  Provider:   Paula Ross, MD   Other Instructions Thank you for choosing [facility]!    

## 2021-03-16 NOTE — Progress Notes (Signed)
? ?Cardiology Office Note ? ? ?Date:  03/16/2021  ? ?ID:  Lisa Mcdowell, DOB 09/26/1950, MRN [id number] ? ?PCP:  Sofie Rower, PA-C  ?Cardiologist:   Dorris Carnes, MD  ? ?Patient referred for evaluation of heart racing   ? ?  ?History of Present Illness: ?Lisa Mcdowell is a 71 y.o. female with a history of dermatomyositis   Follows at [facility] in in Jan with heart racing about 2x per week for 3 wks prior    Referred to cardiology   ?The pt says the episodes lasted about  4 to 5 min   No dizziness   Happened in mornings    The next day at night  ?Now more than 15 days hasnt had    ?When racing felt a slight heaviness in chest  ? ?Currently denies heaviness  Breathing good       ?Activities:   Little by little does house work    Pt says she exercises a little   ? ? ?Current Meds  ?Medication Sig  ? azaTHIOprine (IMURAN) 50 MG tablet Take 50 mg by mouth daily.  ? folic acid (FOLVITE) 1 MG tablet Take 1 mg by mouth daily.  ? methotrexate (RHEUMATREX) 2.5 MG tablet Take by mouth.  ? Vitamin D, Ergocalciferol, (DRISDOL) 1.25 MG (50000 UT) CAPS capsule Take 1 capsule (50,000 Units total) by mouth every Wednesday.  ? ? ? ?Allergies:   Patient has no active allergies.  ? ?Past Medical History:  ?Diagnosis Date  ? Acute blood loss anemia   ? Cholelithiasis   ? Dermatomycosis   ? Gallstones 08/08/2010  ? GERD (gastroesophageal reflux disease)   ? pt denies having GERD on day of surgery  ? Hyperlipidemia   ? Nausea & vomiting   ? Rhabdomyolysis   ? Transaminitis   ? Varicose vein of leg   ? ? ?Past Surgical History:  ?Procedure Laterality Date  ? BIOPSY  08/30/2018  ? Procedure: BIOPSY;  Surgeon: Mauri Pole, MD;  Location: [facility] ENDOSCOPY;  Service: Endoscopy;;  ? CHOLECYSTECTOMY  08/08/2010  ? Procedure: LAPAROSCOPIC CHOLECYSTECTOMY;  Surgeon: Scherry Ran;  Location: [facility]S;  Service: General;  Laterality: N/A;  ? COLONOSCOPY N/A 04/15/2015  ? Procedure: COLONOSCOPY;  Surgeon: Danie Binder, MD;  [facility];  Service: Endoscopy;  Laterality: N/A;  1015 - moved to 10:00 - office to notify - interpreter scheduled, do NOT change time  ? ESOPHAGOGASTRODUODENOSCOPY (EGD) WITH PROPOFOL N/A 08/30/2018  ? Procedure: ESOPHAGOGASTRODUODENOSCOPY (EGD) WITH PROPOFOL;  Surgeon: Mauri Pole, MD;  Location: [facility]OSCOPY;  Service: Endoscopy;  Laterality: N/A;  ? GASTROSTOMY W/ FEEDING TUBE    ? HERNIA REPAIR    ? INSERTION OF MESH N/A 01/23/2018  ? Procedure: INSERTION OF MESH;  Surgeon: Donnie Mesa, MD;  Location: [facility];  Service: General;  Laterality: N/A;  ? MUSCLE BIOPSY Right 09/04/2018  ? Procedure: RIGHT THIGH MUSCLE BIOPSY;  Surgeon: Erroll Luna, MD;  Location: [facility];  Service: General;  Laterality: Right;  ? UMBILICAL HERNIA REPAIR N/A 01/23/2018  ? Procedure: UMBILICAL HERNIA REPAIR WITH MESH;  Surgeon: Donnie Mesa, MD;  Location: [facility];  Service: General;  Laterality: N/A;  ? [place]  2011  ? [facility]  ? ? ? ?Social History:  The patient  reports that she has never smoked. She has never used smokeless tobacco. She reports that she does not drink alcohol and  does not use drugs.  ? ?Family History:  The patient's family history is not on file.  ? ? ?ROS:  Please see the history of present illness. All other systems are reviewed and  Negative to the above problem except as noted.  ? ? ?PHYSICAL EXAM: ?VS:  BP 126/80   Pulse 67   Ht 5' 9.6" (1.768 m)   Wt 154 lb 3.2 oz (69.9 kg)   SpO2 98%   BMI 22.38 kg/m?   ?GEN: Well nourished, well developed, in no acute distress  ?HEENT: normal  ?Neck: no JVD, No bruits ?Cardiac: RRR; no murmurs  Triv edema  ?Respiratory:  clear to auscultation bilaterally,  ?GI: soft, nontender, nondistended, + BS   Mild RUQ tenderness ?MS: no deformity Moving all extremities    Tender to palpition diffusely ?Skin: warm and dry, no rash ?Neuro:  Strength and sensation are intact ?Psych: euthymic mood, full  affect ? ? ?EKG:  EKG is ordered today.  SR 67 bpm   ? ? ?Lipid Panel ?   ?Component Value Date/Time  ? CHOL 186 05/31/2015 0857  ? TRIG 122 05/31/2015 0857  ? HDL 46 05/31/2015 0857  ? CHOLHDL 4.0 05/31/2015 0857  ? VLDL 24 05/31/2015 0857  ? [place] 116 05/31/2015 0857  ? ?  ? ?Wt Readings from Last 3 Encounters:  ?03/16/21 154 lb 3.2 oz (69.9 kg)  ?07/15/20 155 lb (70.3 kg)  ?12/17/18 150 lb 6.4 oz (68.2 kg)  ?  ? ? ?ASSESSMENT AND PLAN: ? ?1  Palpitations/ heart racing     Pt had spells several weeks ago  Self limited   No dizziness  They have gone away.   ?Not hemodynamiclaly destabilizing  even when had    ?Does not sound like afib  ?If recur will set up for monitor     ? ?2  HL   On fenofibrate daily   Get labs from [facility].     Risk of atherosclerosis given prednisone use     ? ?3   Dermatomyositis   Follows at [facility]    ? ?Will be available as needed  ? ? ?Current medicines are reviewed at length with the patient today.  The patient does not have concerns regarding medicines. ? ?Signed, ?Dorris Carnes, MD  ?03/16/2021 3:19 PM    ?[place] ?[address], [place], [place]  [address] ?Phone: [phone]; Fax: [phone]  ? ? ?

## 2022-01-10 ENCOUNTER — Encounter: Payer: Self-pay | Admitting: Internal Medicine

## 2022-02-08 ENCOUNTER — Encounter: Payer: Self-pay | Admitting: Gastroenterology

## 2022-02-20 ENCOUNTER — Encounter: Payer: Self-pay | Admitting: Gastroenterology

## 2022-02-21 ENCOUNTER — Ambulatory Visit: Payer: Self-pay | Admitting: Internal Medicine

## 2022-04-03 ENCOUNTER — Ambulatory Visit (AMBULATORY_SURGERY_CENTER): Payer: Self-pay | Admitting: *Deleted

## 2022-04-03 VITALS — Ht 67.0 in | Wt 151.0 lb

## 2022-04-03 DIAGNOSIS — Z86010 Personal history of colonic polyps: Secondary | ICD-10-CM

## 2022-04-03 MED ORDER — PEG 3350-KCL-NA BICARB-NACL 420 G PO SOLR: 4000.0000 mL | Freq: Once | ORAL | 0 refills | Status: AC

## 2022-04-03 NOTE — Progress Notes (Signed)
No egg or soy allergy known to patient  No issues known to pt with past sedation with any surgeries or procedures Patient denies ever being told they had issues or difficulty with intubation  No FH of Malignant Hyperthermia Pt is not on diet pills Pt is not on  home 02  Pt is not on blood thinners  Pt denies issues with constipation,'sometimes",have bowel movement daily,have to take miralax sometimes last taken 2 weeks ago No A fib or A flutter Have any cardiac testing pending--NO Pt instructed to use Singlecare.com or GoodRx for a price reduction on prep    [employer] interpreter. Patient's chart reviewed by Osvaldo Angst CNRA prior to previsit and patient appropriate for the [place].  Previsit completed and red dot placed by patient's name on their procedure day (on provider's schedule).

## 2022-04-24 ENCOUNTER — Ambulatory Visit (AMBULATORY_SURGERY_CENTER): Payer: Self-pay | Admitting: Gastroenterology

## 2022-04-24 ENCOUNTER — Encounter: Payer: Self-pay | Admitting: Gastroenterology

## 2022-04-24 VITALS — BP 129/62 | HR 58 | Temp 98.0°F | Resp 12 | Ht 67.0 in | Wt 151.0 lb

## 2022-04-24 DIAGNOSIS — D125 Benign neoplasm of sigmoid colon: Secondary | ICD-10-CM

## 2022-04-24 DIAGNOSIS — Z09 Encounter for follow-up examination after completed treatment for conditions other than malignant neoplasm: Secondary | ICD-10-CM

## 2022-04-24 DIAGNOSIS — Z860100 Personal history of colon polyps, unspecified: Secondary | ICD-10-CM

## 2022-04-24 DIAGNOSIS — K5730 Diverticulosis of large intestine without perforation or abscess without bleeding: Secondary | ICD-10-CM

## 2022-04-24 DIAGNOSIS — K640 First degree hemorrhoids: Secondary | ICD-10-CM

## 2022-04-24 DIAGNOSIS — K635 Polyp of colon: Secondary | ICD-10-CM

## 2022-04-24 DIAGNOSIS — D122 Benign neoplasm of ascending colon: Secondary | ICD-10-CM

## 2022-04-24 MED ORDER — SODIUM CHLORIDE 0.9 % IV SOLN: 500.0000 mL | Freq: Once | INTRAVENOUS | Status: DC

## 2022-04-24 NOTE — Progress Notes (Signed)
GASTROENTEROLOGY PROCEDURE H&P NOTE   Primary Care Physician: Reather Converse, PA-C    Reason for Procedure:  Colon polyp surveillance, Colon Cancer screening  Plan:    Colonoscopy  Patient is appropriate for endoscopic procedure(s) in the ambulatory ([facility]) setting.  The nature of the procedure, as well as the risks, benefits, and alternatives were carefully and thoroughly reviewed with the patient. Ample time for discussion and questions allowed. The patient understood, was satisfied, and agreed to proceed.     HPI: Lisa Mcdowell is a 72 y.o. female who presents for colonoscopy for ongoing polyp surveillance and Colon Cancer screening.  No active GI symptoms.  No known family history of colon cancer or related malignancy.  Patient is otherwise without complaints or active issues today.  She is prescribed Imuran and methotrexate for dermatomyositis.  - 04/2015: Colonoscopy: 3 subcentimeter tubular adenomas - 12/2018: Colonoscopy: 3 subcentimeter adenomas in the sigmoid/ascending colon, multiple rectal hyperplastic polyps, ascending diverticulosis, small internal hemorrhoids.  Recommended repeat in 3 years  Past Medical History:  Diagnosis Date   Acute blood loss anemia    PT.DENIES 04/03/22   Cataract    BILATERAL,REPLACED   Cholelithiasis    Dermatomycosis    Gallstones 08/08/2010   GERD (gastroesophageal reflux disease)    pt denies having GERD on day of surgery   Hyperlipidemia    Nausea & vomiting    Rhabdomyolysis    Transaminitis    Varicose vein of leg     Past Surgical History:  Procedure Laterality Date   BIOPSY  08/30/2018   Procedure: BIOPSY;  Surgeon: Napoleon Form, MD;  [facility]: [facility];  Service: Endoscopy;;   CHOLECYSTECTOMY  08/08/2010   Procedure: LAPAROSCOPIC CHOLECYSTECTOMY;  Surgeon: Marlane Hatcher;  Location: [facility];  Service: General;  Laterality: N/A;   COLONOSCOPY N/A 04/15/2015   Procedure: COLONOSCOPY;  Surgeon: West Bali, MD;  [facility];  Service: Endoscopy;  Laterality: N/A;  1015 - moved to 10:00 - office to notify - interpreter scheduled, do NOT change time   ESOPHAGOGASTRODUODENOSCOPY (EGD) WITH PROPOFOL N/A 08/30/2018   Procedure: ESOPHAGOGASTRODUODENOSCOPY (EGD) WITH PROPOFOL;  Surgeon: Napoleon Form, MD;  Location: [facility];  Service: Endoscopy;  Laterality: N/A;   GASTROSTOMY W/ FEEDING TUBE     HERNIA REPAIR     INSERTION OF MESH N/A 01/23/2018   Procedure: INSERTION OF MESH;  Surgeon: Manus Rudd, MD;  Location: [facility];  Service: General;  Laterality: N/A;   MUSCLE BIOPSY Right 09/04/2018   Procedure: RIGHT THIGH MUSCLE BIOPSY;  Surgeon: Harriette Bouillon, MD;  Location[facility];  Service: General;  Laterality: Right;   UMBILICAL HERNIA REPAIR N/A 01/23/2018   Procedure: UMBILICAL HERNIA REPAIR WITH MESH;  Surgeon: Manus Rudd, MD;  Location: [facility];  Service: General;  Laterality: N/A;   VARICOSE VEIN SURGERY  2011   [facility]    Prior to Admission medications   Medication Sig Start Date End Date Taking? Authorizing Provider  azaTHIOprine (IMURAN) 50 MG tablet Take 50 mg by mouth daily.    [provider]  Cholecalciferol (VITAMIN D3) 125 MCG (5000 UT) capsule Take 5,000 Units by mouth daily.    [provider]  doxepin (SINEQUAN) 10 MG capsule Take 10 mg by mouth at bedtime. 09/03/21   [provider]  FENOFIBRATE PO Take 145 mg by mouth daily.    [provider]  folic acid (FOLVITE) 1 MG tablet Take 1  mg by mouth daily. 11/01/20   [provider]  methotrexate (RHEUMATREX) 2.5 MG tablet Take by mouth. 05/02/20   [provider]    Current Outpatient Medications  Medication Sig Dispense Refill   azaTHIOprine (IMURAN) 50 MG tablet Take 50 mg by mouth daily.     Cholecalciferol (VITAMIN D3) 125 MCG (5000 UT) capsule Take 5,000 Units by mouth daily.     doxepin (SINEQUAN) 10 MG  capsule Take 10 mg by mouth at bedtime.     FENOFIBRATE PO Take 145 mg by mouth daily.     folic acid (FOLVITE) 1 MG tablet Take 1 mg by mouth daily.     methotrexate (RHEUMATREX) 2.5 MG tablet Take by mouth.     Current Facility-Administered Medications  Medication Dose Route Frequency Provider Last Rate Last Admin   0.9 %  sodium chloride infusion  500 mL Intravenous Once Johanna Matto V, DO        Allergies as of 04/24/2022   (No Known Allergies)    Family History  Problem Relation Age of Onset   Colon cancer Neg Hx    Esophageal cancer Neg Hx    Rectal cancer Neg Hx    Stomach cancer Neg Hx    Colon polyps Neg Hx    Crohn's disease Neg Hx    Ulcerative colitis Neg Hx     Social History   Socioeconomic History   Marital status: Married    Spouse name: Not on file   Number of children: Not on file   Years of education: Not on file   Highest education level: Not on file  Occupational History   Not on file  Tobacco Use   Smoking status: Never   Smokeless tobacco: Never  Vaping Use   Vaping Use: Never used  Substance and Sexual Activity   Alcohol use: No   Drug use: No   Sexual activity: Not on file  Other Topics Concern   Not on file  Social History Narrative   Not on file   Social Determinants of Health   Financial Resource Strain: Not on file  Food Insecurity: Not on file  Transportation Needs: Not on file  Physical Activity: Not on file  Stress: Not on file  Social Connections: Not on file  Intimate Partner Violence: Not on file    Physical Exam: Vital signs in last 24 hours: @BP  (!) 141/73   Pulse 64   Temp 98 F (36.7 C) (Skin)   Ht 5\' 7"  (1.702 m)   Wt 151 lb (68.5 kg)   SpO2 97%   BMI 23.65 kg/m  GEN: NAD EYE: Sclerae anicteric ENT: MMM CV: Non-tachycardic Pulm: CTA b/l GI: Soft, NT/ND NEURO:  Alert & Oriented x 3   Doristine Locks, DO [facility]   04/24/2022 10:34 AM

## 2022-04-24 NOTE — Progress Notes (Signed)
Report to PACU, RN, vss, BBS= Clear.  

## 2022-04-24 NOTE — Progress Notes (Signed)
Pt's states no medical or surgical changes since previsit or office visit. 

## 2022-04-24 NOTE — Patient Instructions (Signed)
   Handouts on polyps,diverticulosis,& hemorrhoids given to you today  Await pathology results on polyps removed      YOU HAD AN ENDOSCOPIC PROCEDURE TODAY AT THE [facility]:   Refer to the procedure report that was given to you for any specific questions about what was found during the examination.  If the procedure report does not answer your questions, please call your gastroenterologist to clarify.  If you requested that your care partner not be given the details of your procedure findings, then the procedure report has been included in a sealed envelope for you to review at your convenience later.  YOU SHOULD EXPECT: Some feelings of bloating in the abdomen. Passage of more gas than usual.  Walking can help get rid of the air that was put into your GI tract during the procedure and reduce the bloating. If you had a lower endoscopy (such as a colonoscopy or flexible sigmoidoscopy) you may notice spotting of blood in your stool or on the toilet paper. If you underwent a bowel prep for your procedure, you may not have a normal bowel movement for a few days.  Please Note:  You might notice some irritation and congestion in your nose or some drainage.  This is from the oxygen used during your procedure.  There is no need for concern and it should clear up in a day or so.  SYMPTOMS TO REPORT IMMEDIATELY:  Following lower endoscopy (colonoscopy or flexible sigmoidoscopy):  Excessive amounts of blood in the stool  Significant tenderness or worsening of abdominal pains  Swelling of the abdomen that is new, acute  Fever of 100F or higher   For urgent or emergent issues, a gastroenterologist can be reached at any hour by calling [phone]. Do not use MyChart messaging for urgent concerns.    DIET:  We do recommend a small meal at first, but then you may proceed to your regular diet.  Drink plenty of fluids but you should avoid alcoholic beverages for 24 hours.  ACTIVITY:   You should plan to take it easy for the rest of today and you should NOT DRIVE or use heavy machinery until tomorrow (because of the sedation medicines used during the test).    FOLLOW UP: Our staff will call the number listed on your records the next business day following your procedure.  We will call around 7:15- 8:00 am to check on you and address any questions or concerns that you may have regarding the information given to you following your procedure. If we do not reach you, we will leave a message.     If any biopsies were taken you will be contacted by phone or by letter within the next 1-3 weeks.  Please call us at [phone] if you have not heard about the biopsies in 3 weeks.    SIGNATURES/CONFIDENTIALITY: You and/or your care partner have signed paperwork which will be entered into your electronic medical record.  These signatures attest to the fact that that the information above on your After Visit Summary has been reviewed and is understood.  Full responsibility of the confidentiality of this discharge information lies with you and/or your care-partner.  

## 2022-04-24 NOTE — Progress Notes (Signed)
Called to room to assist during endoscopic procedure.  Patient ID and intended procedure confirmed with present staff. Received instructions for my participation in the procedure from the performing physician.  

## 2022-04-24 NOTE — Op Note (Signed)
[facility] Patient Name: Lisa Mcdowell Procedure Date: 04/24/2022 10:19 AM MRN: [id number] Endoscopist: Doristine Locks , MD, [id number] Age: 72 Referring MD:  Date of Birth: 07/31/1950 Gender: Female Account #: [id number] Procedure:                Colonoscopy Indications:              Surveillance: Personal history of adenomatous                            polyps on last colonoscopy 3 years ago                           - 04/2015: Colonoscopy: 3 subcentimeter tubular                            adenomas                           - 12/2018: Colonoscopy: 3 subcentimeter adenomas in                            the sigmoid/ascending colon, multiple rectal                            hyperplastic polyps, ascending diverticulosis,                            small internal hemorrhoids. Recommended repeat in 3                            years Medicines:                Monitored Anesthesia Care Procedure:                Pre-Anesthesia Assessment:                           - Prior to the procedure, a History and Physical                            was performed, and patient medications and                            allergies were reviewed. The patient's tolerance of                            previous anesthesia was also reviewed. The risks                            and benefits of the procedure and the sedation                            options and risks were discussed with the patient.                            All questions were answered, and informed consent  was obtained. Prior Anticoagulants: The patient has                            taken no anticoagulant or antiplatelet agents. ASA                            Grade Assessment: II - A patient with mild systemic                            disease. After reviewing the risks and benefits,                            the patient was deemed in satisfactory condition to                            undergo  the procedure.                           After obtaining informed consent, the colonoscope                            was passed under direct vision. Throughout the                            procedure, the patient's blood pressure, pulse, and                            oxygen saturations were monitored continuously. The                            CF [id number] #[id number] was introduced through the anus                            and advanced to the the terminal ileum. The                            colonoscopy was performed without difficulty. The                            patient tolerated the procedure well. The quality                            of the bowel preparation was good. The terminal                            ileum, ileocecal valve, appendiceal orifice, and                            rectum were photographed. Scope In: 10:54:52 AM Scope Out: 11:17:37 AM Scope Withdrawal Time: 0 hours 14 minutes 38 seconds  Total Procedure Duration: 0 hours 22 minutes 45 seconds  Findings:                 The perianal and digital rectal examinations were  normal.                           A 4 mm polyp was found in the ascending colon. The                            polyp was sessile. The polyp was removed with a                            cold snare. Resection and retrieval were complete.                            Estimated blood loss was minimal.                           A 3 mm polyp was found in the sigmoid colon. The                            polyp was sessile. The polyp was removed with a                            cold snare. Resection and retrieval were complete.                            Estimated blood loss was minimal.                           A few sessile polyps were found in the rectum. The                            polyps were diminutive in size. These were                            previously sampled and confirmed benign                             hyperplastic polyps, and therefore not further                            resected today.                           A few small-mouthed diverticula were found in the                            ascending colon.                           The ascending colon revealed moderately excessive                            looping. Advancing the scope required using manual  pressure.                           Non-bleeding internal hemorrhoids were found during                            retroflexion. The hemorrhoids were small.                           The terminal ileum appeared normal. Complications:            No immediate complications. Estimated Blood Loss:     Estimated blood loss was minimal. Impression:               - One 4 mm polyp in the ascending colon, removed                            with a cold snare. Resected and retrieved.                           - One 3 mm polyp in the sigmoid colon, removed with                            a cold snare. Resected and retrieved.                           - A few diminutive polyps in the rectum.                           - Diverticulosis in the ascending colon.                           - There was significant looping of the colon.                           - Non-bleeding internal hemorrhoids.                           - The examined portion of the ileum was normal.                           - The GI Genius (intelligent endoscopy module),                            computer-aided polyp detection system powered by AI                            was utilized to detect colorectal polyps through                            enhanced visualization during colonoscopy. Recommendation:           - Patient has a contact number available for                            emergencies. The signs and symptoms  of potential                            delayed complications were discussed with the                            patient. Return to  normal activities tomorrow.                            Written discharge instructions were provided to the                            patient.                           - Resume previous diet.                           - Continue present medications.                           - Await pathology results.                           - Repeat colonoscopy for surveillance, with timing                            to determined after review of pathology results.                           - Return to GI office PRN. Doristine LocksVito Janyce Ellinger, MD 04/24/2022 11:27:14 AM

## 2022-04-25 ENCOUNTER — Telehealth: Payer: Self-pay

## 2022-04-25 ENCOUNTER — Telehealth: Payer: Self-pay | Admitting: *Deleted

## 2022-04-25 NOTE — Telephone Encounter (Signed)
-----   Message from [facility] V, DO sent at 04/24/2022 12:09 PM EDT ----- Can you please set this patient up for hemorrhoid banding appt. Thanks!

## 2022-04-25 NOTE — Telephone Encounter (Signed)
Scheduled Hemorrhoid Banding with patient's daughter on 06/13/22 at 3:40 PM.

## 2022-04-25 NOTE — Telephone Encounter (Signed)
  Follow up Call-     04/24/2022   10:37 AM  Call back number  Post procedure Call Back phone  # [phone]  Permission to leave phone message Yes     Patient questions:  Do you have a fever, pain , or abdominal swelling? No. Pain Score  0 *  Have you tolerated food without any problems? Yes.    Have you been able to return to your normal activities? Yes.    Do you have any questions about your discharge instructions: Diet   No. Medications  No. Follow up visit  No.  Do you have questions or concerns about your Care? No.  Actions: * If pain score is 4 or above: No action needed, pain <4.

## 2022-04-30 ENCOUNTER — Encounter: Payer: Self-pay | Admitting: Gastroenterology

## 2022-06-13 ENCOUNTER — Encounter: Payer: Self-pay | Admitting: Gastroenterology

## 2022-06-20 NOTE — Congregational Nurse Program (Signed)
Received and reviewed pt enrollment [facility] Program packet to review health risk stratisfication  Pt is dual enrolled at the [facility] and received her last PCP follow up visit with them on 5.16.24  Pt has no dm or htn; but does have other medical conditions in which she receives treatment  Pt does take meds for other conditions. Has access to [facility] [place] [facility]  Pt  has next appointment scheduled for 07/03/22 @3pm  for a 1 month followup   No SODH or health questionnaire was completed at time of enrollment, however per pt [employer] EMR was reviewed to identify any needs if present and their was none this time

## 2022-06-21 NOTE — Congregational Nurse Program (Signed)
Updated Patients' PCP and Care Coordination status as it relates to [facility]'s eligibility  to assist other health care teams best coordinate pt's health servic

## 2022-08-20 ENCOUNTER — Encounter: Payer: Self-pay | Admitting: Gastroenterology

## 2022-11-23 ENCOUNTER — Encounter: Payer: Self-pay | Admitting: Gastroenterology
